# Patient Record
Sex: Female | Born: 1945 | Race: Black or African American | Hispanic: No | Marital: Married | Smoking: Never smoker
Health system: Southern US, Community
[De-identification: ages and names within clinical notes are randomized; demographics above are authoritative.]

## PROBLEM LIST (undated history)

## (undated) DIAGNOSIS — J40 Bronchitis, not specified as acute or chronic: Secondary | ICD-10-CM

## (undated) DIAGNOSIS — I10 Essential (primary) hypertension: Secondary | ICD-10-CM

## (undated) DIAGNOSIS — Z853 Personal history of malignant neoplasm of breast: Secondary | ICD-10-CM

## (undated) DIAGNOSIS — E785 Hyperlipidemia, unspecified: Secondary | ICD-10-CM

## (undated) HISTORY — DX: Essential (primary) hypertension: I10

## (undated) HISTORY — DX: Personal history of malignant neoplasm of breast: Z85.3

## (undated) HISTORY — PX: TONSILLECTOMY: SUR1361

## (undated) HISTORY — DX: Hyperlipidemia, unspecified: E78.5

---

## 1992-04-25 HISTORY — PX: OTHER SURGICAL HISTORY: SHX169

## 1992-04-25 HISTORY — PX: BREAST LUMPECTOMY: SHX2

## 1997-10-15 ENCOUNTER — Other Ambulatory Visit: Admission: RE | Admit: 1997-10-15 | Discharge: 1997-10-15 | Payer: Self-pay | Admitting: Surgery

## 2000-08-31 ENCOUNTER — Encounter: Admission: RE | Admit: 2000-08-31 | Discharge: 2000-08-31 | Payer: Self-pay | Admitting: Surgery

## 2000-08-31 ENCOUNTER — Encounter: Payer: Self-pay | Admitting: Surgery

## 2002-08-31 ENCOUNTER — Emergency Department (HOSPITAL_COMMUNITY): Admission: EM | Admit: 2002-08-31 | Discharge: 2002-08-31 | Payer: Self-pay | Admitting: Emergency Medicine

## 2003-09-26 ENCOUNTER — Inpatient Hospital Stay (HOSPITAL_COMMUNITY): Admission: EM | Admit: 2003-09-26 | Discharge: 2003-09-30 | Payer: Self-pay | Admitting: Family Medicine

## 2008-08-14 ENCOUNTER — Emergency Department (HOSPITAL_COMMUNITY): Admission: EM | Admit: 2008-08-14 | Discharge: 2008-08-14 | Payer: Self-pay | Admitting: Emergency Medicine

## 2009-05-19 ENCOUNTER — Emergency Department (HOSPITAL_COMMUNITY): Admission: EM | Admit: 2009-05-19 | Discharge: 2009-05-19 | Payer: Self-pay | Admitting: Family Medicine

## 2009-10-13 ENCOUNTER — Emergency Department (HOSPITAL_COMMUNITY): Admission: EM | Admit: 2009-10-13 | Discharge: 2009-10-13 | Payer: Self-pay | Admitting: Emergency Medicine

## 2009-10-22 ENCOUNTER — Emergency Department (HOSPITAL_COMMUNITY): Admission: EM | Admit: 2009-10-22 | Discharge: 2009-10-22 | Payer: Self-pay | Admitting: Emergency Medicine

## 2010-03-12 ENCOUNTER — Other Ambulatory Visit: Admission: RE | Admit: 2010-03-12 | Discharge: 2010-03-12 | Payer: Self-pay | Admitting: Internal Medicine

## 2010-03-12 ENCOUNTER — Ambulatory Visit: Payer: Self-pay | Admitting: Internal Medicine

## 2010-03-12 DIAGNOSIS — I10 Essential (primary) hypertension: Secondary | ICD-10-CM

## 2010-03-12 DIAGNOSIS — J45909 Unspecified asthma, uncomplicated: Secondary | ICD-10-CM

## 2010-03-12 DIAGNOSIS — Z853 Personal history of malignant neoplasm of breast: Secondary | ICD-10-CM

## 2010-03-12 DIAGNOSIS — E7801 Familial hypercholesterolemia: Secondary | ICD-10-CM

## 2010-03-12 DIAGNOSIS — E119 Type 2 diabetes mellitus without complications: Secondary | ICD-10-CM

## 2010-03-12 LAB — CONVERTED CEMR LAB
Alkaline Phosphatase: 77 units/L (ref 39–117)
BUN: 16 mg/dL (ref 6–23)
Basophils Relative: 1 % (ref 0.0–3.0)
Bilirubin, Direct: 0.1 mg/dL (ref 0.0–0.3)
Calcium: 9.4 mg/dL (ref 8.4–10.5)
Cholesterol: 214 mg/dL — ABNORMAL HIGH (ref 0–200)
Creatinine, Ser: 0.7 mg/dL (ref 0.4–1.2)
Direct LDL: 148.9 mg/dL
Eosinophils Absolute: 1.6 10*3/uL — ABNORMAL HIGH (ref 0.0–0.7)
Eosinophils Relative: 17.1 % — ABNORMAL HIGH (ref 0.0–5.0)
Hgb A1c MFr Bld: 6.8 % — ABNORMAL HIGH (ref 4.6–6.5)
Ketones, ur: NEGATIVE mg/dL
Leukocytes, UA: NEGATIVE
Lymphocytes Relative: 30.4 % (ref 12.0–46.0)
MCHC: 33.3 g/dL (ref 30.0–36.0)
Neutrophils Relative %: 44.3 % (ref 43.0–77.0)
Nitrite: NEGATIVE
Platelets: 254 10*3/uL (ref 150.0–400.0)
RBC: 4.51 M/uL (ref 3.87–5.11)
Specific Gravity, Urine: 1.025 (ref 1.000–1.030)
Total Bilirubin: 0.4 mg/dL (ref 0.3–1.2)
Total CHOL/HDL Ratio: 7
Total CK: 79 units/L (ref 7–177)
Total Protein: 7.4 g/dL (ref 6.0–8.3)
Triglycerides: 102 mg/dL (ref 0.0–149.0)
Urobilinogen, UA: 0.2 (ref 0.0–1.0)
VLDL: 20.4 mg/dL (ref 0.0–40.0)
WBC: 9.4 10*3/uL (ref 4.5–10.5)
pH: 5 (ref 5.0–8.0)

## 2010-03-14 ENCOUNTER — Encounter: Payer: Self-pay | Admitting: Internal Medicine

## 2010-03-17 ENCOUNTER — Encounter: Payer: Self-pay | Admitting: Internal Medicine

## 2010-03-22 ENCOUNTER — Ambulatory Visit: Payer: Self-pay | Admitting: Oncology

## 2010-04-23 ENCOUNTER — Ambulatory Visit: Payer: Self-pay | Admitting: Oncology

## 2010-04-27 ENCOUNTER — Encounter: Payer: Self-pay | Admitting: Internal Medicine

## 2010-05-17 ENCOUNTER — Encounter: Payer: Self-pay | Admitting: Internal Medicine

## 2010-05-17 ENCOUNTER — Other Ambulatory Visit (HOSPITAL_COMMUNITY): Payer: Self-pay | Admitting: Oncology

## 2010-05-17 DIAGNOSIS — Z1239 Encounter for other screening for malignant neoplasm of breast: Secondary | ICD-10-CM

## 2010-05-17 LAB — CBC WITH DIFFERENTIAL/PLATELET
Basophils Absolute: 0.2 10*3/uL — ABNORMAL HIGH (ref 0.0–0.1)
Eosinophils Absolute: 1.4 10*3/uL — ABNORMAL HIGH (ref 0.0–0.5)
HCT: 40 % (ref 34.8–46.6)
LYMPH%: 28.7 % (ref 14.0–49.7)
MCV: 89.9 fL (ref 79.5–101.0)
MONO%: 7.1 % (ref 0.0–14.0)
NEUT#: 5.1 10*3/uL (ref 1.5–6.5)
NEUT%: 49.3 % (ref 38.4–76.8)
Platelets: 261 10*3/uL (ref 145–400)
RBC: 4.44 10*6/uL (ref 3.70–5.45)

## 2010-05-17 LAB — COMPREHENSIVE METABOLIC PANEL
Alkaline Phosphatase: 79 U/L (ref 39–117)
BUN: 21 mg/dL (ref 6–23)
Creatinine, Ser: 0.67 mg/dL (ref 0.40–1.20)
Glucose, Bld: 83 mg/dL (ref 70–99)
Sodium: 143 mEq/L (ref 135–145)
Total Bilirubin: 0.3 mg/dL (ref 0.3–1.2)

## 2010-05-17 LAB — LACTATE DEHYDROGENASE: LDH: 140 U/L (ref 94–250)

## 2010-05-25 NOTE — Letter (Signed)
Summary: Lipid Letter  [facility]  [address] [place], [place] [address]   Phone: [phone]  Fax: [phone]    03/14/2010  Rital Cavey [address] [place], [place]  [address]  Dear Ms. Loomer:  We have carefully reviewed your last lipid profile from  and the results are noted below with a summary of recommendations for lipid management.    Cholesterol:       214     Goal: <200   HDL "good" Cholesterol:   36.64     Goal: >50   LDL "bad" Cholesterol:   149     Goal: <100   Triglycerides:       102.0     Goal: <150    other labs look good    TLC Diet (Therapeutic Lifestyle Change): Saturated Fats & Transfatty acids should be kept < 7% of total calories ***Reduce Saturated Fats Polyunstaurated Fat can be up to 10% of total calories Monounsaturated Fat Fat can be up to 20% of total calories Total Fat should be no greater than 25-35% of total calories Carbohydrates should be 50-60% of total calories Protein should be approximately 15% of total calories Fiber should be at least 20-30 grams a day ***Increased fiber may help lower LDL Total Cholesterol should be < 200mg /day Consider adding plant stanol/sterols to diet (example: Benacol spread) ***A higher intake of unsaturated fat may reduce Triglycerides and Increase HDL    Adjunctive Measures (may lower LIPIDS and reduce risk of Heart Attack) include: Aerobic Exercise (20-30 minutes 3-4 times a week) Limit Alcohol Consumption Weight Reduction Aspirin 75-81 mg a day by mouth (if not allergic or contraindicated) Dietary Fiber 20-30 grams a day by mouth     Current Medications: 1)    Azithromycin 250 Mg Tabs (Azithromycin) .... Take as directed 2)    Allegra Allergy 180 Mg Tabs (Fexofenadine hcl) 3)    Norvasc 5 Mg Tabs (Amlodipine besylate) 4)    Asa 81mg   5)    Coricidin Hbp Cough/cold 4-30 Mg Tabs (Chlorpheniramine-dm) 6)    Livalo 2 Mg Tabs (Pitavastatin calcium) .... One by mouth once  daily for cholesterol  If you have any questions, please call. We appreciate being able to work with you.   Sincerely,     [facility] Etta Grandchild MD

## 2010-05-25 NOTE — Assessment & Plan Note (Signed)
Summary: new pt/self pay/#/lb   Vital Signs:  Patient profile:   65 year old female Menstrual status:  postmenopausal Height:      70 inches Weight:      124 pounds BMI:     17.86 O2 Sat:      97 % on Room air Temp:     97.9 degrees F oral Pulse rate:   75 / minute Pulse rhythm:   regular Resp:     16 per minute BP sitting:   140 / 84  (left arm) Cuff size:   large  Vitals Entered By: Rock Nephew CMA (March 12, 2010 1:22 PM)  O2 Flow:  Room air CC: New to establish// recently seen at [facility] for elevated glucose, Preventive Care Is Patient Diabetic? Yes Did you bring your meter with you today? No Pain Assessment Patient in pain? no       Does patient need assistance? Functional Status Self care Ambulation Normal     Menstrual Status postmenopausal   Primary Care Provider:  Etta Grandchild MD  CC:  New to establish// recently seen at [facility] for elevated glucose and Preventive Care.  History of Present Illness: New to me she needs a complete physical. She has had some recent unexplained weight loss. She is taking a zpak for a recent URI and says that she is doing much better.  Preventive Screening-Counseling & Management  Alcohol-Tobacco     Alcohol drinks/day: 0     Alcohol Counseling: not indicated; patient does not drink     Smoking Status: never     Tobacco Counseling: not indicated; no tobacco use  Caffeine-Diet-Exercise     Does Patient Exercise: no  Hep-HIV-STD-Contraception     Hepatitis Risk: no risk noted     HIV Risk: no risk noted     STD Risk: no risk noted     Dental Visit-last 6 months no     Dental Care Counseling: to seek dental care; no dental care within six months     SBE monthly: yes     SBE Education/Counseling: to perform regular SBE      Sexual History:  currently monogamous.        Drug Use:  no.        Blood Transfusions:  no.    Medications Prior to Update: 1)  None  Current Medications (verified): 1)  Azithromycin 250  Mg Tabs (Azithromycin) .... Take As Directed 2)  Allegra Allergy 180 Mg Tabs (Fexofenadine Hcl) 3)  Norvasc 5 Mg Tabs (Amlodipine Besylate) 4)  Asa 81mg  5)  Coricidin Hbp Cough/cold 4-30 Mg Tabs (Chlorpheniramine-Dm) 6)  Livalo 2 Mg Tabs (Pitavastatin Calcium) .... One By Mouth Once Daily For Cholesterol  Allergies (verified): 1)  ! Lipitor  Past History:  Past Medical History: Breast cancer, hx of Diabetes mellitus, type II Hyperlipidemia Hypertension Asthma  Past Surgical History: Lumpectomy- left in 1994 Tonsillectomy right port-a-cath - in 1994  Family History: Family History Breast cancer 1st degree relative <50 Family History Diabetes 1st degree relative Family History Hypertension  Social History: Occupation: Chief Strategy Officer Married Never Smoked Alcohol use-no Drug use-no Regular exercise-no Smoking Status:  never Hepatitis Risk:  no risk noted HIV Risk:  no risk noted STD Risk:  no risk noted Dental Care w/in 6 mos.:  no Sexual History:  currently monogamous Blood Transfusions:  no Drug Use:  no Does Patient Exercise:  no  Review of Systems       The patient complains  of weight loss.  The patient denies anorexia, fever, weight gain, hoarseness, chest pain, syncope, dyspnea on exertion, peripheral edema, prolonged cough, headaches, hemoptysis, abdominal pain, melena, hematochezia, severe indigestion/heartburn, hematuria, muscle weakness, suspicious skin lesions, transient blindness, depression, abnormal bleeding, enlarged lymph nodes, angioedema, and breast masses.   General:  Complains of weight loss; denies chills, fatigue, fever, loss of appetite, malaise, sweats, and weakness. GU:  Denies abnormal vaginal bleeding, discharge, hematuria, urinary frequency, and urinary hesitancy. Endo:  Denies cold intolerance, excessive hunger, excessive thirst, excessive urination, heat intolerance, and polyuria.  Physical Exam  General:  alert, well-developed,  well-nourished, well-hydrated, appropriate dress, normal appearance, healthy-appearing, cooperative to examination, and good hygiene.   Head:  normocephalic, atraumatic, no abnormalities observed, no abnormalities palpated, and no alopecia.   Eyes:  vision grossly intact, pupils equal, pupils round, and pupils reactive to light.   Ears:  R ear normal and L ear normal.   Nose:  External nasal examination shows no deformity or inflammation. Nasal mucosa are pink and moist without lesions or exudates. Mouth:  Oral mucosa and oropharynx without lesions or exudates.  Teeth in good repair. Neck:  supple, full ROM, no masses, no thyromegaly, no thyroid nodules or tenderness, no JVD, normal carotid upstroke, no carotid bruits, no cervical lymphadenopathy, and no neck tenderness.   Chest Wall:  no deformities, no tenderness, no mass, and surgical scar.   Breasts:  No mass, nodules, thickening, tenderness, bulging, retraction, inflamation, nipple discharge or skin changes noted.   Lungs:  normal respiratory effort, no intercostal retractions, no accessory muscle use, normal breath sounds, no dullness, no fremitus, no crackles, and no wheezes.   Heart:  normal rate, regular rhythm, no murmur, no gallop, no rub, and no JVD.   Abdomen:  soft, non-tender, normal bowel sounds, no distention, no masses, no guarding, no rigidity, no rebound tenderness, no abdominal hernia, no inguinal hernia, no hepatomegaly, and no splenomegaly.   Rectal:  no external abnormalities, normal sphincter tone, no masses, no tenderness, no fissures, no fistulae, no perianal rash, and external hemorrhoid(s).   Genitalia:  normal introitus, no external lesions, no vaginal discharge, mucosa pink and moist, no vaginal or cervical lesions, no friaility or hemorrhage, normal uterus size and position, no adnexal masses or tenderness, and vaginal atrophy.    Diabetes Management Exam:    Foot Exam (with socks and/or shoes not present):        Sensory-Pinprick/Light touch:          Left medial foot (L-4): normal          Left dorsal foot (L-5): normal          Left lateral foot (S-1): normal          Right medial foot (L-4): normal          Right dorsal foot (L-5): normal          Right lateral foot (S-1): normal       Sensory-Monofilament:          Left foot: normal          Right foot: normal       Inspection:          Left foot: normal          Right foot: normal       Nails:          Left foot: normal          Right foot: normal   Impression &  Recommendations:  Problem # 1:  HYPERTENSION (ICD-401.9) Assessment Unchanged will check renal function and consider another BP med if needed Her updated medication list for this problem includes:    Norvasc 5 Mg Tabs (Amlodipine besylate)  Orders: Fingerstick ([address]) TLB-Lipid Panel (80061-LIPID) TLB-BMP (Basic Metabolic Panel-BMET) (80048-METABOL) TLB-CBC Platelet - w/Differential (85025-CBCD) TLB-Hepatic/Liver Function Pnl (80076-HEPATIC) TLB-TSH (Thyroid Stimulating Hormone) (84443-TSH) TLB-CK Total Only(Creatine Kinase/CPK) (82550-CK) TLB-A1C / Hgb A1C (Glycohemoglobin) (83036-A1C) TLB-Udip w/ Micro (81001-URINE)  BP today: 140/84  Problem # 2:  HYPERLIPIDEMIA (ICD-272.4) Assessment: Unchanged  The following medications were removed from the medication list:    Lipitor 20 Mg Tabs (Atorvastatin calcium) Her updated medication list for this problem includes:    Livalo 2 Mg Tabs (Pitavastatin calcium) ..... One by mouth once daily for cholesterol  Orders: Fingerstick (36416) TLB-Lipid Panel (80061-LIPID) TLB-BMP (Basic Metabolic Panel-BMET) (80048-METABOL) TLB-CBC Platelet - w/Differential (85025-CBCD) TLB-Hepatic/Liver Function Pnl (80076-HEPATIC) TLB-TSH (Thyroid Stimulating Hormone) (84443-TSH) TLB-CK Total Only(Creatine Kinase/CPK) (82550-CK) TLB-A1C / Hgb A1C (Glycohemoglobin) (83036-A1C) TLB-Udip w/ Micro (81001-URINE)  Problem # 3:  DIABETES  MELLITUS, TYPE II (ICD-250.00) Assessment: New  Orders: Fingerstick ([address]) TLB-Lipid Panel (80061-LIPID) TLB-BMP (Basic Metabolic Panel-BMET) (80048-METABOL) TLB-CBC Platelet - w/Differential (85025-CBCD) TLB-Hepatic/Liver Function Pnl (80076-HEPATIC) TLB-TSH (Thyroid Stimulating Hormone) (84443-TSH) TLB-CK Total Only(Creatine Kinase/CPK) (82550-CK) TLB-A1C / Hgb A1C (Glycohemoglobin) (83036-A1C) TLB-Udip w/ Micro (81001-URINE) Ophthalmology Referral (Ophthalmology)  Problem # 4:  ROUTINE GENERAL MEDICAL EXAM@HEALTH  CARE FACL (ICD-V70.0) Assessment: New  Orders: Radiology Referral (Radiology) Gastroenterology Referral (GI)  Discussed using sunscreen, use of alcohol, drug use, self breast exam, routine dental care, routine eye care, schedule for GYN exam, routine physical exam, seat belts, multiple vitamins, osteoporosis prevention, adequate calcium intake in diet, recommendations for immunizations, mammograms and Pap smears.  Discussed exercise and checking cholesterol.   Complete Medication List: 1)  Azithromycin 250 Mg Tabs (Azithromycin) .... Take as directed 2)  Allegra Allergy 180 Mg Tabs (Fexofenadine hcl) 3)  Norvasc 5 Mg Tabs (Amlodipine besylate) 4)  Asa 81mg   5)  Coricidin Hbp Cough/cold 4-30 Mg Tabs (Chlorpheniramine-dm) 6)  Livalo 2 Mg Tabs (Pitavastatin calcium) .... One by mouth once daily for cholesterol  Other Orders: Oncology Referral (Oncology) Specimen Handling (09811)  Colorectal Screening:  Current Recommendations:    Hemoccult: NEG X 1 today    Colonoscopy recommended: scheduled with G.I.  PAP Screening:    Hx Cervical Dysplasia in last 5 yrs? No    3 normal PAP smears in last 5 yrs? No    Reviewed PAP smear recommendations:  PAP smear done  Mammogram Screening:    Reviewed Mammogram recommendations:  mammogram ordered  Osteoporosis Risk Assessment:  Risk Factors for Fracture or Low Bone Density:   Smoking status:       never  Patient  Instructions: 1)  Please schedule a follow-up appointment in 1 month. 2)  Schedule your mammogram. 3)  Schedule a colonoscopy/sigmoidoscopy to help detect colon cancer. 4)  You need to have a Pap Smear to prevent cervical cancer. 5)  Check your blood sugars regularly. If your readings are usually above 200 or below 70 you should contact our office. 6)  It is important that your Diabetic A1c level is checked every 3 months. 7)  See your eye doctor yearly to check for diabetic eye damage. 8)  Check your feet each night for sore areas, calluses or signs of infection. 9)  Check your Blood Pressure regularly. If it is above 130/80: you should make an appointment. Prescriptions: LIVALO 2 MG  TABS (PITAVASTATIN CALCIUM) One by mouth once daily for cholesterol  #70 x 0   Entered and Authorized by:   Etta Grandchild MD   Signed by:   Etta Grandchild MD on 03/12/2010   Method used:   Samples Given   RxID:   [id number]    Orders Added: 1)  Oncology Referral [Oncology] 2)  Fingerstick [36416] 3)  TLB-Lipid Panel [80061-LIPID] 4)  TLB-BMP (Basic Metabolic Panel-BMET) [80048-METABOL] 5)  TLB-CBC Platelet - w/Differential [85025-CBCD] 6)  TLB-Hepatic/Liver Function Pnl [80076-HEPATIC] 7)  TLB-TSH (Thyroid Stimulating Hormone) [84443-TSH] 8)  TLB-CK Total Only(Creatine Kinase/CPK) [82550-CK] 9)  TLB-A1C / Hgb A1C (Glycohemoglobin) [83036-A1C] 10)  TLB-Udip w/ Micro [81001-URINE] 11)  Ophthalmology Referral [Ophthalmology] 6)  Radiology Referral [Radiology] 13)  Gastroenterology Referral [GI] 14)  Specimen Handling [99000] 15)  New Patient Level V [99205]    Prevention & Chronic Care Immunizations   Influenza vaccine: Not documented   Influenza vaccine deferral: Refused  (03/12/2010)    Tetanus booster: Not documented   Td booster deferral: Refused  (03/12/2010)    Pneumococcal vaccine: Not documented    H. zoster vaccine: Not documented   H. zoster vaccine deferral: Refused   (03/12/2010)  Colorectal Screening   Hemoccult: Not documented   Hemoccult action/deferral: NEG X 1 today  (03/12/2010)    Colonoscopy: Not documented   Colonoscopy action/deferral: scheduled with G.I.  (03/12/2010)  Other Screening   Pap smear: Not documented   Pap smear action/deferral: PAP smear done  (03/12/2010)    Mammogram: Not documented   Mammogram action/deferral: mammogram ordered  (03/12/2010)    DXA bone density scan: Not documented   Smoking status: never  (03/12/2010)  Diabetes Mellitus   HgbA1C: Not documented    Eye exam: Not documented    Foot exam: yes  (03/12/2010)   High risk foot: Not documented   Foot care education: Not documented    Urine microalbumin/creatinine ratio: Not documented  Lipids   Total Cholesterol: Not documented   LDL: Not documented   LDL Direct: Not documented   HDL: Not documented   Triglycerides: Not documented    SGOT (AST): Not documented   SGPT (ALT): Not documented   Alkaline phosphatase: Not documented   Total bilirubin: Not documented  Hypertension   Last Blood Pressure: 140 / 84  (03/12/2010)   Serum creatinine: Not documented   Serum potassium Not documented  Self-Management Support :    Diabetes self-management support: Not documented    Hypertension self-management support: Not documented    Lipid self-management support: Not documented

## 2010-05-25 NOTE — Letter (Signed)
Summary: Results Follow-up Letter  [facility]  [address] [place], [place] [address]   Phone: [phone]  Fax: [phone]    03/17/2010  [address] [place], [place]  [address]  Dear Ms. Marion,   The following are the results of your recent test(s):  Test     Result     Pap Smear    Normal___XXX____  Not Normal_____       Comments:   _________________________________________________________  Please call for an appointment as directed _________________________________________________________ _________________________________________________________ _________________________________________________________  Sincerely,  Sanda Linger MD  [facility]

## 2010-05-27 NOTE — Letter (Signed)
Summary: Referral - not able to see patient  [facility]  [address] [place], [place] [address]   Phone: [phone]  Fax: [phone]    April 27, 2010   Etta Grandchild, M.D. [address] [place], [place] [address]   Diane Richmond DOB:  Dec 20, 1945 MRN:   [id number]    Dear Dr. Yetta Barre:  Thank you for your kind referral of the above patient.  We have attempted to schedule the recommended procedure Screening Colonoscopy but have not been able to schedule because:   X  The patient was not available by phone and/or has not returned our calls.  ___ The patient declined to schedule the procedure at this time.  We appreciate the referral and hope that we will have the opportunity to treat this patient in the future.    Sincerely,    [facility] [phone]

## 2010-06-01 ENCOUNTER — Ambulatory Visit
Admission: RE | Admit: 2010-06-01 | Discharge: 2010-06-01 | Disposition: A | Discharge status: Home / Self Care | Payer: Self-pay | Source: Ambulatory Visit | Attending: Oncology | Admitting: Oncology

## 2010-06-01 DIAGNOSIS — Z1239 Encounter for other screening for malignant neoplasm of breast: Secondary | ICD-10-CM

## 2010-06-07 ENCOUNTER — Telehealth: Payer: Self-pay | Admitting: Internal Medicine

## 2010-06-07 ENCOUNTER — Other Ambulatory Visit (HOSPITAL_COMMUNITY): Payer: Self-pay | Admitting: Oncology

## 2010-06-07 DIAGNOSIS — R921 Mammographic calcification found on diagnostic imaging of breast: Secondary | ICD-10-CM

## 2010-06-07 DIAGNOSIS — R928 Other abnormal and inconclusive findings on diagnostic imaging of breast: Secondary | ICD-10-CM

## 2010-06-10 NOTE — Letter (Signed)
Summary: [facility]  [facility]   Imported By: Sherian Rein 06/03/2010 08:54:06  _____________________________________________________________________  External Attachment:    Type:   Image     Comment:   External Document

## 2010-06-16 NOTE — Progress Notes (Signed)
Summary: Cholesterol med  Phone Note Call from Patient   Caller: Marisue Ivan - [phone] Summary of Call: Patient is requesting rx for generic med for cholesterol. Initial call taken by: Lamar Sprinkles, CMA,  June 07, 2010 2:22 PM  Follow-up for Phone Call        Pt informed  Follow-up by: Lamar Sprinkles, CMA,  June 07, 2010 4:59 PM    New/Updated Medications: PRAVACHOL 40 MG TABS (PRAVASTATIN SODIUM) one by mouth once daily Prescriptions: PRAVACHOL 40 MG TABS (PRAVASTATIN SODIUM) one by mouth once daily  #90 x 3   Entered by:   Lamar Sprinkles, CMA   Authorized by:   Etta Grandchild MD   Signed by:   Lamar Sprinkles, CMA on 06/07/2010   Method used:   Electronically to        [facility]  [address] [phone]* (retail)       [address]       [place], [place]  [id number]       Ph: [phone] or [phone]       Fax: [phone]   RxID:   [id number] PRAVACHOL 40 MG TABS (PRAVASTATIN SODIUM) one by mouth once daily  #30 x 11   Entered and Authorized by:   Etta Grandchild MD   Signed by:   Etta Grandchild MD on 06/07/2010   Method used:   Historical   RxID:   [id number]

## 2010-07-28 ENCOUNTER — Ambulatory Visit
Admission: RE | Admit: 2010-07-28 | Discharge: 2010-07-28 | Disposition: A | Discharge status: Home / Self Care | Payer: Self-pay | Source: Ambulatory Visit | Attending: Oncology | Admitting: Oncology

## 2010-07-28 DIAGNOSIS — R928 Other abnormal and inconclusive findings on diagnostic imaging of breast: Secondary | ICD-10-CM

## 2010-08-31 ENCOUNTER — Telehealth: Payer: Self-pay | Admitting: *Deleted

## 2010-08-31 NOTE — Telephone Encounter (Signed)
Spoke w/pt, she is on payment plan and working on getting medicaid or other assistance and can not afford OV. Would MD be willing to do 1 mth RF while this issue gets settled.

## 2010-08-31 NOTE — Telephone Encounter (Signed)
Update - pt takes 2 once daily, pharm is [facility] [address], pt would like a call when complete.

## 2010-08-31 NOTE — Telephone Encounter (Signed)
Patient requesting RF of Metformin 500 mg (unsure freq). At last ov pt did not have med with her. Is this ok to fill?

## 2010-08-31 NOTE — Telephone Encounter (Signed)
okay

## 2010-08-31 NOTE — Telephone Encounter (Signed)
She is overdue for follow up

## 2010-09-01 ENCOUNTER — Telehealth: Payer: Self-pay

## 2010-09-01 MED ORDER — METFORMIN HCL 500 MG PO TABS: 500.0000 mg | ORAL_TABLET | Freq: Two times a day (BID) | ORAL | Status: DC

## 2010-09-01 NOTE — Telephone Encounter (Signed)
Patient notified

## 2010-09-10 NOTE — Discharge Summary (Signed)
Diane Richmond, Diane Richmond NO.:  [id number]   MEDICAL RECORD NO.:  [id number]                   PATIENT TYPE:  INP   LOCATION:  4743                                 FACILITY:  [facility]   PHYSICIAN:  Sherin Quarry, MD                   DATE OF BIRTH:  1945/05/03   DATE OF ADMISSION:  09/26/2003  DATE OF DISCHARGE:  09/30/2003                           DISCHARGE SUMMARY - REFERRING   HISTORY OF PRESENT ILLNESS:  The patient is a 65 year old lady who initially  presented on September 26, 2003, with onset the previous day of numbness in the  arm, described as intermittent.  The patient had numbness in her face on  September 26, 2003.  She reported that she had never had a spell like that  previously.  She had no previous history of diabetes or hypertension.   ADMISSION MEDICATIONS:  1. Clarinex.  2. Compazine p.r.n.   FAMILY HISTORY:  Remarkable for a stroke in the patient's mother and her  father.   PHYSICAL EXAMINATION:  VITAL SIGNS:  As described by Dr. Efraim Kaufmann L. Taylor,  blood pressure 154/105, pulse 70, respirations 18.  HEENT:  Within normal limits.  CHEST:  Clear to auscultation and percussion.  CARDIOVASCULAR:  Normal S1 and S2, without rubs, murmurs, or gallops.  ABDOMEN:  Benign.  Normal bowel sounds.  There are no masses or tenderness,  no guarding or rebound.  NEUROLOGIC:  The patient's strength was described as 5/5.  Deep tendon  reflexes were symmetrical.   LABORATORY DATA:  CBC which revealed a white count of 9100, hemoglobin 14.6.  Prothrombin time was within normal limits.  Sodium 142, potassium 3.7,  creatinine 0.8, BUN 7.  A1c hemoglobin was 5.7.  Homocysteine level was  11.16.  Lipid profile was notable for a total cholesterol of 276, LDL  cholesterol of 212, HDL cholesterol 30.  TSH 1.84.  RPR was nonreactive.  An MRI scan of the brain showed no acute infarction, with chronic small  vessel disease noted.  A curious finding on the MRI scan was  that the  radiologist described an abnormal process involving the marrow space of the  skull.  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  No evidence of metastatic  disease was seen in either the brain or the lepto-meninges.  The patient  does have a remote history of breast cancer.  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  No  other abnormalities were detected.  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  Carotid studies showed no significant ICA stenosis.   HOSPITAL COURSE:  The patient was seen in consultation by Dr. Sunny Schlein.  Sethi and Dr. Gustavus Messing. [facility] of the neurology service.  They carefully  reviewed the  patient's workup, and ultimately recommended that she be placed  on Aggrenox one tab b.i.d.  A 2-D echocardiogram was pending at the time of discharge.   DISPOSITION:  On September 30, 2003, the patient was discharged.   DISCHARGE DIAGNOSES:  1. Possible small vessel transient ischemic attacks.  2. Hypertension.  3. Hyperlipidemia.  4. Remote history of breast cancer.   DISCHARGE MEDICATIONS:  1. Aggrenox one tab b.i.d.  2. Lipitor 10 mg daily.  3. Norvasc 5 mg daily.   FOLLOWUP:  1. An appointment will be made with Dr. Meredith Staggers in two to three     weeks.  2. An appointment will be made with Dr. Sunny Schlein. Sethi in two to three     months.  I did not make an appointment for an oncology followup in regard to  abnormalities seen on the MRI scan.  This was because the patient was quite  unclear about what oncologist had been seeing her in the past.  She could  not describe where the doctor's office was located.  For this reason, I  thought it would be reasonable for Dr. Dayton Scrape to review the records, and to  arrange for this appointment, if he feels it is a reasonable idea.   CONDITION ON DISCHARGE:  Good.                                                 Sherin Quarry, MD    SY/MEDQ  D:  09/30/2003  T:  09/30/2003  Job:  [id number]   cc:   Meredith Staggers, M.D.  [address]  [place]  [place] [address]  Fax: [phone]

## 2010-09-10 NOTE — Consult Note (Signed)
NAMEKAILANI, BRASS NO.:  [id number]   MEDICAL RECORD NO.:  [id number]                   PATIENT TYPE:  EMS   LOCATION:  URG                                  FACILITY:  [facility]   PHYSICIAN:  Gustavus Messing. Orlin Hilding, M.D.          DATE OF BIRTH:  1946-01-01   DATE OF CONSULTATION:  09/26/2003  DATE OF DISCHARGE:                                   CONSULTATION   CHIEF COMPLAINT:  Intermittent right-sided numbness.   HISTORY OF PRESENT ILLNESS:  Ms. Splinter is a 65 year old right-handed black  woman with a history of hypertension, not currently taking medications,  although she is supposed to be on Norvasc.  She [facility] with a 24-hour history of multiple brief episodes of right-sided  numbness of face, arm and one leg.  She had several more since she has been  in the emergency room lasting from 5 minutes to 30 minutes apparently.  She  denies any problems with vision change, speech or swallowing associated with  it.   REVIEW OF SYSTEMS:  Also negative for any weakness, cognitive problems,  chest pains or shortness of breath.   PAST MEDICAL HISTORY:  She told me she had hypertension in the past.  However, she is hypertensive now and is supposed to be taking Norvasc.  She  has a history of breast cancer, status post mastectomy and chemotherapy and  allergies.   MEDICATIONS:  1. Allegra.  2. Tylenol.  3. Norvasc.  4. Prednisone.  5. Hydroxyzine.   ALLERGIES:  Aspirin although the patient is equivocal about this.   SOCIAL HISTORY:  Negative for cigarettes.   FAMILY HISTORY:  Positive for stroke.   PHYSICAL EXAMINATION:  VITAL SIGNS:  Temperature 98.8, pulse 78,  respirations 20, blood pressure 164/93.  HEENT:  Head is normocephalic, atraumatic.  NECK:  Supple without bruits.  HEART:  Regular rate and rhythm without murmurs.  EXTREMITIES:  Without edema or cyanosis.  SKIN:  Warm and dry.  NEUROLOGIC:  Mental status:  She is  awake, alert, appropriate with normal  naming, repetition and comprehensive, and also has spontaneous speech.  Cranial nerves:  Pupils are equal and reactive.  Visual fields are full to  confrontation.  Extraocular movements are intact.  Facial sensation is  normal.  Her motor activity is intact.  Hearing is intact.  Palate is symmetric,  tongue is midline.  She has a good shoulder shrug.  On motor exam, she has  no drift of either upper or lower extremity.  No satelliting.  Normal rapid  fine movement.  No fasciculations, atrophy or tremor.  Reflexes are 1+ and  symmetric with downgoing toes and plantar stimulation, coordination, finger-  to-nose, heel-to-shin are normal.  Sensory exam is intact to light touch,  temperature and vibration currently.  She is not in the middle of a spell of her numbness at this time.  IMPRESSION:  Transient but recurrent right body numbness in a setting of  hypertension.  Need to rule out small vessel ischemia.  She may be on the  verge of a small-vessel stroke with fluctuating status.   RECOMMENDATION:  Would put her on IV heparin until workup is complete.  Get  an MRI scan of the brain and MRA, intracranial and extracranial, a 2-D.  She  may need Plavix if she is truly allergic to aspirin.                                               Catherine A. Orlin Hilding, M.D.    CAW/MEDQ  D:  09/26/2003  T:  09/28/2003  Job:  [id number]

## 2010-12-01 ENCOUNTER — Other Ambulatory Visit: Payer: Self-pay | Admitting: *Deleted

## 2010-12-01 ENCOUNTER — Other Ambulatory Visit: Payer: Self-pay | Admitting: Internal Medicine

## 2010-12-01 MED ORDER — METFORMIN HCL 500 MG PO TABS: 500.0000 mg | ORAL_TABLET | Freq: Two times a day (BID) | ORAL | Status: DC

## 2010-12-06 ENCOUNTER — Ambulatory Visit: Payer: Self-pay | Admitting: Internal Medicine

## 2010-12-06 ENCOUNTER — Telehealth: Payer: Self-pay

## 2010-12-06 NOTE — Telephone Encounter (Signed)
abstract

## 2010-12-17 ENCOUNTER — Ambulatory Visit (INDEPENDENT_AMBULATORY_CARE_PROVIDER_SITE_OTHER): Payer: Self-pay | Admitting: Internal Medicine

## 2010-12-17 ENCOUNTER — Other Ambulatory Visit (INDEPENDENT_AMBULATORY_CARE_PROVIDER_SITE_OTHER): Payer: Self-pay

## 2010-12-17 ENCOUNTER — Telehealth: Payer: Self-pay

## 2010-12-17 ENCOUNTER — Encounter: Payer: Self-pay | Admitting: Internal Medicine

## 2010-12-17 ENCOUNTER — Other Ambulatory Visit: Payer: Self-pay | Admitting: Internal Medicine

## 2010-12-17 DIAGNOSIS — E785 Hyperlipidemia, unspecified: Secondary | ICD-10-CM

## 2010-12-17 DIAGNOSIS — R5381 Other malaise: Secondary | ICD-10-CM

## 2010-12-17 DIAGNOSIS — I10 Essential (primary) hypertension: Secondary | ICD-10-CM

## 2010-12-17 DIAGNOSIS — J45909 Unspecified asthma, uncomplicated: Secondary | ICD-10-CM

## 2010-12-17 DIAGNOSIS — E119 Type 2 diabetes mellitus without complications: Secondary | ICD-10-CM

## 2010-12-17 DIAGNOSIS — R9431 Abnormal electrocardiogram [ECG] [EKG]: Secondary | ICD-10-CM

## 2010-12-17 DIAGNOSIS — R5383 Other fatigue: Secondary | ICD-10-CM | POA: Insufficient documentation

## 2010-12-17 DIAGNOSIS — R079 Chest pain, unspecified: Secondary | ICD-10-CM

## 2010-12-17 LAB — CBC WITH DIFFERENTIAL/PLATELET
Basophils Relative: 0.6 % (ref 0.0–3.0)
Eosinophils Relative: 44.5 % — ABNORMAL HIGH (ref 0.0–5.0)
HCT: 39.6 % (ref 36.0–46.0)
Hemoglobin: 12.8 g/dL (ref 12.0–15.0)
Lymphs Abs: 4.2 10*3/uL — ABNORMAL HIGH (ref 0.7–4.0)
MCV: 90 fl (ref 78.0–100.0)
Monocytes Absolute: 1 10*3/uL (ref 0.1–1.0)
Monocytes Relative: 4.2 % (ref 3.0–12.0)
Neutro Abs: 8 10*3/uL — ABNORMAL HIGH (ref 1.4–7.7)
Platelets: 310 10*3/uL (ref 150.0–400.0)
RBC: 4.4 Mil/uL (ref 3.87–5.11)
WBC: 24 10*3/uL (ref 4.5–10.5)

## 2010-12-17 LAB — URINALYSIS, ROUTINE W REFLEX MICROSCOPIC
Ketones, ur: NEGATIVE
Urine Glucose: NEGATIVE
Urobilinogen, UA: 0.2 (ref 0.0–1.0)

## 2010-12-17 LAB — COMPREHENSIVE METABOLIC PANEL
Albumin: 3.7 g/dL (ref 3.5–5.2)
Alkaline Phosphatase: 96 U/L (ref 39–117)
BUN: 10 mg/dL (ref 6–23)
CO2: 29 mEq/L (ref 19–32)
GFR: 119.89 mL/min (ref >60.00)
Glucose, Bld: 83 mg/dL (ref 70–99)
Sodium: 140 mEq/L (ref 135–145)
Total Bilirubin: 0.4 mg/dL (ref 0.3–1.2)
Total Protein: 9.1 g/dL — ABNORMAL HIGH (ref 6.0–8.3)

## 2010-12-17 LAB — HEMOGLOBIN A1C: Hgb A1c MFr Bld: 6 % (ref 4.6–6.5)

## 2010-12-17 LAB — LDL CHOLESTEROL, DIRECT: Direct LDL: 156.5 mg/dL

## 2010-12-17 LAB — LIPID PANEL
Total CHOL/HDL Ratio: 8
Triglycerides: 150 mg/dL — ABNORMAL HIGH (ref 0.0–149.0)

## 2010-12-17 LAB — TSH: TSH: 0.95 u[IU]/mL (ref 0.35–5.50)

## 2010-12-17 MED ORDER — METHYLPREDNISOLONE ACETATE 80 MG/ML IJ SUSP
120.0000 mg | Freq: Once | INTRAMUSCULAR | Status: AC
  Administered 2010-12-17: 120 mg via INTRAMUSCULAR

## 2010-12-17 MED ORDER — OLMESARTAN MEDOXOMIL 20 MG PO TABS: 20.0000 mg | ORAL_TABLET | Freq: Every day | ORAL | Status: DC

## 2010-12-17 MED ORDER — BUDESONIDE-FORMOTEROL FUMARATE 80-4.5 MCG/ACT IN AERO: 2.0000 | INHALATION_SPRAY | Freq: Two times a day (BID) | RESPIRATORY_TRACT | Status: DC

## 2010-12-17 NOTE — Progress Notes (Signed)
Subjective:    Patient ID: Diane Richmond, female    DOB: Oct 11, 1945, 65 y.o.   MRN: [id number]  Diabetes She presents for her follow-up diabetic visit. She has type 2 diabetes mellitus. Her disease course has been fluctuating. There are no hypoglycemic associated symptoms. Pertinent negatives for hypoglycemia include no dizziness, headaches, pallor, seizures, speech difficulty, sweats or tremors. Associated symptoms include fatigue. Pertinent negatives for diabetes include no blurred vision, no chest pain, no foot paresthesias, no foot ulcerations, no polydipsia, no polyphagia, no polyuria, no visual change, no weakness and no weight loss. Symptoms are stable. There are no diabetic complications. Current diabetic treatment includes oral agent (monotherapy). She is compliant with treatment all of the time. Her weight is stable. She is following a generally healthy diet. Meal planning includes avoidance of concentrated sweets. She never participates in exercise. There is no change in her home blood glucose trend. An ACE inhibitor/angiotensin II receptor blocker is not being taken. Eye exam is current.  Hypertension This is a chronic problem. The current episode started more than 1 year ago. The problem has been gradually worsening since onset. The problem is uncontrolled. Associated symptoms include malaise/fatigue. Pertinent negatives include no anxiety, blurred vision, chest pain, headaches, neck pain, orthopnea, palpitations, peripheral edema, PND, shortness of breath or sweats. There are no associated agents to hypertension. Past treatments include calcium channel blockers. The current treatment provides moderate improvement. Compliance problems include exercise and diet.       Review of Systems  Constitutional: Positive for malaise/fatigue and fatigue. Negative for fever, chills, weight loss, diaphoresis, activity change, appetite change and unexpected weight change.  HENT: Negative for sore throat,  facial swelling, trouble swallowing, neck pain, neck stiffness and voice change.   Eyes: Negative for blurred vision, photophobia, redness and visual disturbance.  Respiratory: Negative for apnea, cough, choking, chest tightness, shortness of breath, wheezing and stridor.   Cardiovascular: Negative for chest pain, palpitations, orthopnea, leg swelling and PND.  Gastrointestinal: Negative for nausea, vomiting, diarrhea, constipation, blood in stool and abdominal distention.  Genitourinary: Negative for dysuria, urgency, polyuria, frequency, hematuria, flank pain, decreased urine volume, enuresis, difficulty urinating, genital sores and dyspareunia.  Musculoskeletal: Negative for myalgias, back pain, joint swelling, arthralgias and gait problem.  Skin: Negative for color change, pallor, rash and wound.  Neurological: Negative for dizziness, tremors, seizures, syncope, facial asymmetry, speech difficulty, weakness, light-headedness, numbness and headaches.  Hematological: Negative for polydipsia, polyphagia and adenopathy. Does not bruise/bleed easily.  Psychiatric/Behavioral: Negative.        Objective:   Physical Exam  Vitals reviewed. Constitutional: She is oriented to person, place, and time. She appears well-developed and well-nourished. No distress.  HENT:  Head: Normocephalic and atraumatic.  Right Ear: External ear normal.  Left Ear: External ear normal.  Nose: Nose normal.  Mouth/Throat: Oropharynx is clear and moist. No oropharyngeal exudate.  Eyes: Conjunctivae are normal. Right eye exhibits no discharge. Left eye exhibits no discharge. No scleral icterus.  Neck: Normal range of motion. Neck supple. No JVD present. No tracheal deviation present. No thyromegaly present.  Cardiovascular: Normal rate, regular rhythm, normal heart sounds and intact distal pulses.  Exam reveals no gallop and no friction rub.   No murmur heard. Pulmonary/Chest: Effort normal and breath sounds normal. No  stridor. No respiratory distress. She has no wheezes. She has no rales. She exhibits no tenderness.  Abdominal: Soft. Bowel sounds are normal. She exhibits no distension and no mass. There is no tenderness. There is no  rebound and no guarding.  Musculoskeletal: Normal range of motion. She exhibits no edema and no tenderness.  Lymphadenopathy:    She has no cervical adenopathy.  Neurological: She is alert and oriented to person, place, and time. She has normal reflexes. She displays normal reflexes. No cranial nerve deficit. She exhibits normal muscle tone.  Skin: Skin is warm and dry. No rash noted. She is not diaphoretic. No erythema. No pallor.  Psychiatric: She has a normal mood and affect. Her behavior is normal. Judgment and thought content normal.      Lab Results  Component Value Date   WBC 9.4 03/12/2010   HGB 13.7 05/17/2010   HCT 40.0 05/17/2010   PLT 261 05/17/2010   CHOL 214* 03/12/2010   TRIG 102.0 03/12/2010   HDL 32.80* 03/12/2010   LDLDIRECT 148.9 03/12/2010   ALT 11 05/17/2010   AST 19 05/17/2010   NA 143 05/17/2010   K 4.0 05/17/2010   CL 102 05/17/2010   CREATININE 0.67 05/17/2010   BUN 21 05/17/2010   CO2 30 05/17/2010   TSH 0.81 03/12/2010   HGBA1C 6.8* 03/12/2010      Assessment & Plan:

## 2010-12-17 NOTE — Patient Instructions (Signed)
Diabetes, Type 2 Diabetes is a lasting (chronic) disease. In type 2 diabetes, the pancreas does not make enough insulin (a hormone), and the body does not respond normally to the insulin that is made. This type of diabetes was also previously called adult onset diabetes. About 90% of all those who have diabetes have type 2. It usually occurs after the age of 40 but can occur at any age. CAUSES Unlike type 1 diabetes, which happens because insulin is no longer being made, type 2 diabetes happens because the body is making less insulin and has trouble using the insulin properly. SYMPTOMS  Drinking more than usual.   Urinating more than usual.   Blurred vision.   Dry, itchy skin.   Frequent infection like yeast infections in women.   More tired than usual (fatigue).  TREATMENT  Healthy eating.   Exercise.   Medication, if needed.   Monitoring blood glucose (sugar).   Seeing your caregiver regularly.  HOME CARE INSTRUCTIONS  Check your blood glucose (sugar) at least once daily. More frequent monitoring may be necessary, depending on your medications and on how well your diabetes is controlled. Your caregiver will advise you.   Take your medicine as directed by your caregiver.   Do not smoke.   Make wise food choices. Ask your caregiver for information. Weight loss can improve your diabetes.   Learn about low blood glucose (hypoglycemia) and how to treat it.   Get your eyes checked regularly.   Have a yearly physical exam. Have your blood pressure checked. Get your blood and urine tested.   Wear a pendant or bracelet saying that you have diabetes.   Check your feet every night for sores. Let your caregiver know if you have sores that are not healing.  SEEK MEDICAL CARE IF:  You are having problems keeping your blood glucose at target range.   You feel you might be having problems with your medicines.   You have symptoms of an illness that is not improving after 24  hours.   You have a sore or wound that is not healing.   You notice a change in vision or a new problem with your vision.   You develop a fever of more than 100.5.  Document Released: 04/11/2005 Document Re-Released: 05/03/2009 [employer] Patient Information 2011 [employer]. 

## 2010-12-17 NOTE — Telephone Encounter (Signed)
FYI.Marland KitchenMarland KitchenMarland KitchenHope with the lab called to report a critical white count of 24.0. Results reviewed by Dr Posey Rea who advised that pt should have CBC rechecked in about a month.

## 2010-12-19 ENCOUNTER — Encounter: Payer: Self-pay | Admitting: Internal Medicine

## 2010-12-19 NOTE — Assessment & Plan Note (Signed)
Her EKD shows a LBBB, I do not have an old EKG for comparison, she has fatigue but no other symptoms, I will ask her to see cardiology

## 2010-12-19 NOTE — Assessment & Plan Note (Signed)
She does not check her BS, today I will check an A1C and will monitor her renal function

## 2010-12-19 NOTE — Assessment & Plan Note (Signed)
I will check labs to look for a cause of her fatigue

## 2010-12-19 NOTE — Assessment & Plan Note (Signed)
Continue current regimen

## 2010-12-19 NOTE — Assessment & Plan Note (Signed)
Her BP is not well controlled and she needs renal protection so I have added Benicar to the amlodipine

## 2010-12-20 ENCOUNTER — Ambulatory Visit: Payer: Self-pay | Admitting: Cardiovascular Disease

## 2010-12-22 ENCOUNTER — Ambulatory Visit: Payer: Self-pay | Admitting: Internal Medicine

## 2010-12-28 ENCOUNTER — Encounter: Payer: Self-pay | Admitting: Cardiovascular Disease

## 2010-12-31 ENCOUNTER — Other Ambulatory Visit: Payer: Self-pay | Admitting: Internal Medicine

## 2010-12-31 ENCOUNTER — Ambulatory Visit: Payer: Self-pay | Admitting: Cardiovascular Disease

## 2011-01-03 ENCOUNTER — Other Ambulatory Visit: Payer: Self-pay | Admitting: Internal Medicine

## 2011-01-28 ENCOUNTER — Ambulatory Visit: Payer: Self-pay | Admitting: Cardiovascular Disease

## 2011-02-16 ENCOUNTER — Encounter: Payer: Self-pay | Admitting: *Deleted

## 2011-03-04 ENCOUNTER — Other Ambulatory Visit: Payer: Self-pay | Admitting: Internal Medicine

## 2011-03-04 ENCOUNTER — Ambulatory Visit (INDEPENDENT_AMBULATORY_CARE_PROVIDER_SITE_OTHER): Payer: Self-pay | Admitting: Internal Medicine

## 2011-03-04 ENCOUNTER — Encounter: Payer: Self-pay | Admitting: Internal Medicine

## 2011-03-04 VITALS — BP 118/82 | HR 96 | Temp 97.7°F | Wt 115.8 lb

## 2011-03-04 DIAGNOSIS — J0100 Acute maxillary sinusitis, unspecified: Secondary | ICD-10-CM

## 2011-03-04 DIAGNOSIS — H6690 Otitis media, unspecified, unspecified ear: Secondary | ICD-10-CM

## 2011-03-04 DIAGNOSIS — K029 Dental caries, unspecified: Secondary | ICD-10-CM

## 2011-03-04 MED ORDER — HYDROCODONE-HOMATROPINE 5-1.5 MG/5ML PO SYRP: 5.0000 mL | ORAL_SOLUTION | Freq: Four times a day (QID) | ORAL | Status: AC | PRN

## 2011-03-04 MED ORDER — AMOXICILLIN-POT CLAVULANATE 875-125 MG PO TABS: 1.0000 | ORAL_TABLET | Freq: Two times a day (BID) | ORAL | Status: AC

## 2011-03-04 NOTE — Progress Notes (Signed)
  Subjective:    Patient ID: Diane Richmond, female    DOB: Aug 23, 1945, 65 y.o.   MRN: [id number]  HPI complains of cough>> productive of yellow sputum associated with overwhelming fatigue and right sided maxillary sinus pressure Mod ear pain, upper jaw pain and throat pain on same right side Known "bad tooth" on upper right side but has not yet seen a dentist No fever but +chills Onset 4 days ago Not improved with OTC cough meds  Past Medical History  Diagnosis Date  . Hypertension   . Hyperlipidemia   . Diabetes mellitus     type II  . HX: breast cancer   . Asthma     Review of Systems  Constitutional: Positive for fatigue. Negative for fever.  Respiratory: Negative for shortness of breath and wheezing.   Cardiovascular: Negative for palpitations and leg swelling.  Neurological: Negative for light-headedness and headaches.       Objective:   Physical Exam BP 118/82  Pulse 96  Temp(Src) 97.7 F (36.5 C) (Oral)  Wt 115 lb 12.8 oz (52.527 kg)  SpO2 96% Constitutional: She appears tired but well-developed and well-nourished. No distress. dtr at side HENT: Head: Normocephalic and atraumatic. Ears: L TM ok, R TM with erythema and hazy effusion; Nose: Nose normal. Tender over R maxillary region Mouth/Throat: Oropharynx is clear and moist. No oropharyngeal exudate.  poor dentition right upper molar caries Eyes: Conjunctivae and EOM are normal. Pupils are equal, round, and reactive to light. No scleral icterus.  Neck: Normal range of motion. Neck supple. No JVD present. R side cervical LAD, mild. No thyromegaly present.  Cardiovascular: Normal rate, regular rhythm and normal heart sounds.  No murmur heard. No BLE edema. Pulmonary/Chest: Effort normal and breath sounds normal. No respiratory distress. She has no wheezes.  Neurological: She is alert and oriented to person, place, and time. No cranial nerve deficit. Coordination normal.  Psychiatric: She has a normal mood and  affect. Her behavior is normal. Judgment and thought content normal.        Assessment & Plan:  Right otitis media Right maxillary sinusitis Right side dental caries  No neuro deficit Treat with Augmentin x10 days as well as Hydromet for pain and cough control Advised followup with dentist next week Call sooner if worse or unimproved

## 2011-03-04 NOTE — Patient Instructions (Signed)
It was good to see you today. Use Augmentin antibiotics twice daily for 10 days Also prescription cough medication for pain and cough symptoms Your prescription(s) have been submitted to your pharmacy. Please take as directed and contact our office if you believe you are having problem(s) with the medication(s). Call your dentist for followup next week If symptoms unimproved with the above treatment, call for reevaluation, sooner if worse

## 2011-03-10 ENCOUNTER — Telehealth: Payer: Self-pay

## 2011-03-10 MED ORDER — METFORMIN HCL 500 MG PO TABS: 500.0000 mg | ORAL_TABLET | Freq: Two times a day (BID) | ORAL | Status: DC

## 2011-03-10 NOTE — Telephone Encounter (Signed)
Received refill request from for  [facility] for metformin 500mg .

## 2011-03-14 ENCOUNTER — Encounter: Payer: Self-pay | Admitting: Cardiovascular Disease

## 2011-04-08 ENCOUNTER — Other Ambulatory Visit: Payer: Self-pay | Admitting: Internal Medicine

## 2011-04-21 ENCOUNTER — Encounter: Payer: Self-pay | Admitting: Cardiovascular Disease

## 2011-05-09 ENCOUNTER — Other Ambulatory Visit: Payer: Self-pay | Admitting: Internal Medicine

## 2011-05-25 ENCOUNTER — Encounter: Payer: Self-pay | Admitting: Cardiovascular Disease

## 2011-05-25 DIAGNOSIS — I447 Left bundle-branch block, unspecified: Secondary | ICD-10-CM | POA: Insufficient documentation

## 2011-05-26 ENCOUNTER — Ambulatory Visit: Payer: Self-pay | Admitting: Cardiovascular Disease

## 2011-06-10 ENCOUNTER — Ambulatory Visit (INDEPENDENT_AMBULATORY_CARE_PROVIDER_SITE_OTHER)
Admission: RE | Admit: 2011-06-10 | Discharge: 2011-06-10 | Disposition: A | Discharge status: Home / Self Care | Payer: Medicare Other | Source: Ambulatory Visit | Attending: Endocrinology | Admitting: Endocrinology

## 2011-06-10 ENCOUNTER — Encounter: Payer: Self-pay | Admitting: Endocrinology

## 2011-06-10 ENCOUNTER — Ambulatory Visit (INDEPENDENT_AMBULATORY_CARE_PROVIDER_SITE_OTHER): Payer: Medicare Other | Admitting: Endocrinology

## 2011-06-10 VITALS — BP 142/88 | HR 85 | Temp 98.4°F

## 2011-06-10 DIAGNOSIS — R059 Cough, unspecified: Secondary | ICD-10-CM

## 2011-06-10 DIAGNOSIS — R05 Cough: Secondary | ICD-10-CM

## 2011-06-10 MED ORDER — GLUCOSE BLOOD VI STRP: 1.0000 | ORAL_STRIP | Status: DC | PRN

## 2011-06-10 MED ORDER — METHYLPREDNISOLONE (PAK) 4 MG PO TABS: ORAL_TABLET | ORAL | Status: DC

## 2011-06-10 MED ORDER — GLUCOSE BLOOD VI STRP: 1.0000 | ORAL_STRIP | Freq: Every day | Status: DC

## 2011-06-10 MED ORDER — PROMETHAZINE-CODEINE 6.25-10 MG/5ML PO SYRP
5.0000 mL | ORAL_SOLUTION | ORAL | Status: AC | PRN
Start: 2011-06-10 — End: 2011-06-20

## 2011-06-10 MED ORDER — CEFUROXIME AXETIL 250 MG PO TABS: 250.0000 mg | ORAL_TABLET | Freq: Two times a day (BID) | ORAL | Status: AC

## 2011-06-10 NOTE — Progress Notes (Signed)
  Subjective:    Patient ID: Diane Richmond, female    DOB: July 29, 1945, 66 y.o.   MRN: [id number]  HPI Pt states 1 week of slight prod-quality cough in the chest, and assoc wheezing.  She has nasal congestion.   She has not recently checked cbg.   Past Medical History  Diagnosis Date  . Hypertension   . Hyperlipidemia   . Diabetes mellitus     type II  . HX: breast cancer   . Asthma     Past Surgical History  Procedure Date  . Breast lumpectomy 1994    left  . Tonsillectomy   . Right port-a-cath 1994    History   Social History  . Marital Status: Married    Spouse Name: N/A    Number of Children: N/A  . Years of Education: N/A   Occupational History  . nurse assistant    Social History Main Topics  . Smoking status: Never Smoker   . Smokeless tobacco: Not on file  . Alcohol Use: No  . Drug Use: No  . Sexually Active: Not Currently   Other Topics Concern  . Not on file   Social History Narrative  . No narrative on file    Current Outpatient Prescriptions on File Prior to Visit  Medication Sig Dispense Refill  . amLODipine (NORVASC) 5 MG tablet Take 5 mg by mouth daily.        Marland Kitchen aspirin 81 MG tablet Take 81 mg by mouth daily.        . Blood Glucose Monitoring Suppl (ACCU-CHEK NANO SMARTVIEW) W/DEVICE KIT by Does not apply route.        . budesonide-formoterol (SYMBICORT) 80-4.5 MCG/ACT inhaler Inhale 2 puffs into the lungs 2 (two) times daily.  4 Inhaler  0  . Fexofenadine HCl (ALLEGRA PO) Take by mouth daily.       . metFORMIN (GLUCOPHAGE) 500 MG tablet Take 1 tablet (500 mg total) by mouth 2 (two) times daily.  60 tablet  0  . olmesartan (BENICAR) 20 MG tablet Take 1 tablet (20 mg total) by mouth daily.  70 tablet  0  . Oxymetazoline HCl (AFRIN NASAL SPRAY NA) Place into the nose.        . pravastatin (PRAVACHOL) 40 MG tablet Take 40 mg by mouth daily.          Allergies  Allergen Reactions  . Atorvastatin     REACTION: legs ache    Family History    Problem Relation Age of Onset  . Cancer Neg Hx   . Heart disease Neg Hx   . Breast cancer    . Diabetes    . Hypertension      BP 142/88  Pulse 85  Temp(Src) 98.4 F (36.9 C) (Oral)  SpO2 95%    Review of Systems She has sore throat.  She has chills, but is uncertain about fever.     Objective:   Physical Exam VITAL SIGNS:  See vs page GENERAL: no distress head: no deformity eyes: no periorbital swelling, no proptosis external nose and ears are normal mouth: no lesion seen Both eac's and tm's are normal NECK: There is no palpable thyroid enlargement.  No thyroid nodule is palpable.  No palpable lymphadenopathy at the anterior neck. LUNGS:  Clear to auscultation   Cxr: nad    Assessment & Plan:  Acute bronchitis, new DM.  She is at risk for worsening with steroids

## 2011-06-10 NOTE — Patient Instructions (Addendum)
Here are 3 prescriptions: steroid "pack," antibiotic, and cough syrup. I hope you feel better soon.  If you don't feel better by next week, please call back.   check your blood sugar 1 time a day.  vary the time of day when you check, between before the 3 meals, and at bedtime.  also check if you have symptoms of your blood sugar being too high or too low.  please keep a record of the readings and bring it to your next appointment here.  please call us sooner if your blood sugar goes over 200. A chest-x-ray is requested for you today.  please call [phone] to hear your test results.  You will be prompted to enter the 9-digit "MRN" number that appears at the top left of this page, followed by #.  Then you will hear the message. (update: i left message on phone-tree:  rx as we discussed)

## 2011-06-20 ENCOUNTER — Other Ambulatory Visit: Payer: Self-pay | Admitting: Internal Medicine

## 2011-07-01 ENCOUNTER — Encounter: Payer: Self-pay | Admitting: Internal Medicine

## 2011-07-01 ENCOUNTER — Ambulatory Visit (INDEPENDENT_AMBULATORY_CARE_PROVIDER_SITE_OTHER): Payer: Medicare Other | Admitting: Internal Medicine

## 2011-07-01 VITALS — BP 150/82 | HR 93 | Temp 97.6°F

## 2011-07-01 DIAGNOSIS — J45901 Unspecified asthma with (acute) exacerbation: Secondary | ICD-10-CM

## 2011-07-01 DIAGNOSIS — E119 Type 2 diabetes mellitus without complications: Secondary | ICD-10-CM

## 2011-07-01 MED ORDER — AZITHROMYCIN 250 MG PO TABS: ORAL_TABLET | ORAL | Status: DC

## 2011-07-01 MED ORDER — METHYLPREDNISOLONE ACETATE 80 MG/ML IJ SUSP
80.0000 mg | Freq: Once | INTRAMUSCULAR | Status: AC
  Administered 2011-07-01: 80 mg via INTRAMUSCULAR

## 2011-07-01 NOTE — Progress Notes (Signed)
  Subjective:   HPI  complains of continued chest cold symptoms  Onset >3 week ago, seen here for same - improved briefly, but then resumed symptoms  Initially associated with rhinorrhea, sneezing, sore throat, mild headache and low grade fever Now cough, wheeze, shortness of breath and mild-mod chest congestion No relief with OTC meds - did not take medrol taper Denies sick contacts  Past Medical History  Diagnosis Date  . Hypertension   . Hyperlipidemia   . Diabetes mellitus     type II  . HX: breast cancer   . Asthma     Review of Systems Constitutional: No night sweats, no unexpected weight change Pulmonary: No pleurisy or hemoptysis Cardiovascular: No chest pain or palpitations     Objective:   Physical Exam BP 150/82  Pulse 93  Temp(Src) 97.6 F (36.4 C) (Oral)  SpO2 97% GEN: mildly ill appearing and audible chest congestion HENT: NCAT, no sinus tenderness bilaterally, nares with clear discharge, oropharynx mild erythema, no exudate Eyes: Vision grossly intact, no conjunctivitis Lungs: Bilateral scattered rhonchi - diffuse expiratory wheeze, no increased work of breathing at rest Cardiovascular: Regular rate and rhythm, no bilateral edema  Lab Results  Component Value Date   WBC 24.0 cH* 12/17/2010   HGB 12.8 12/17/2010   HCT 39.6 12/17/2010   PLT 310.0 12/17/2010   GLUCOSE 83 12/17/2010   CHOL 211* 12/17/2010   TRIG 150.0* 12/17/2010   HDL 27.70* 12/17/2010   LDLDIRECT 156.5 12/17/2010   ALT 10 12/17/2010   AST 20 12/17/2010   NA 140 12/17/2010   K 4.0 12/17/2010   CL 103 12/17/2010   CREATININE 0.6 12/17/2010   BUN 10 12/17/2010   CO2 29 12/17/2010   TSH 0.95 12/17/2010   HGBA1C 6.0 12/17/2010      Assessment & Plan:  Asthmatic bronchitis -  Persisting intrinsic asthma Cough, postnasal drip related to above DM2    Reviewed indication importance of asthma medications Add rescue inhaler albuterol when necessary IM Medrol today with nebulized treatment in  office> symptoms improved Empiric antibiotics prescribed due to symptom duration greater than 7 days Symptomatic care with Tylenol or Advil, hydration and rest -  salt gargle advised as needed patient advised on anticipated increase in cbgs due to steroid effect for next several days - pt will call if cbg>200

## 2011-07-01 NOTE — Patient Instructions (Addendum)
It was good to see you today. We have reviewed your usual home medications today and clarified the purpose of schedule at each Use albuterol rescue inhaler 2 puffs every 6 hours as needed for wheeze, cough or shortness of the Steroid shot and albuterol treatment nebulizer given in office today Zpak antibiotics - Your prescription(s) have been submitted to your pharmacy. Please take as directed and contact our office if you believe you are having problem(s) with the medication(s). Use the Medrol taper pack as previously prescribed by Dr. Everardo All in February Call if symptoms unimproved in 7-10 days, sooner if worse Watch your sugars and call if over 200 in next few days - steroids will increase your sugars more than usual for next few days!

## 2011-07-05 ENCOUNTER — Encounter: Payer: Self-pay | Admitting: Cardiovascular Disease

## 2011-07-09 ENCOUNTER — Other Ambulatory Visit: Payer: Self-pay | Admitting: Internal Medicine

## 2011-07-20 ENCOUNTER — Encounter: Payer: Self-pay | Admitting: Internal Medicine

## 2011-07-20 ENCOUNTER — Ambulatory Visit (INDEPENDENT_AMBULATORY_CARE_PROVIDER_SITE_OTHER): Payer: Medicare Other | Admitting: Internal Medicine

## 2011-07-20 VITALS — BP 158/84 | HR 84 | Temp 97.4°F | Resp 16 | Wt 120.0 lb

## 2011-07-20 DIAGNOSIS — E785 Hyperlipidemia, unspecified: Secondary | ICD-10-CM

## 2011-07-20 DIAGNOSIS — J45901 Unspecified asthma with (acute) exacerbation: Secondary | ICD-10-CM

## 2011-07-20 DIAGNOSIS — J209 Acute bronchitis, unspecified: Secondary | ICD-10-CM | POA: Insufficient documentation

## 2011-07-20 DIAGNOSIS — E119 Type 2 diabetes mellitus without complications: Secondary | ICD-10-CM

## 2011-07-20 DIAGNOSIS — R05 Cough: Secondary | ICD-10-CM | POA: Insufficient documentation

## 2011-07-20 DIAGNOSIS — D72829 Elevated white blood cell count, unspecified: Secondary | ICD-10-CM

## 2011-07-20 DIAGNOSIS — J45909 Unspecified asthma, uncomplicated: Secondary | ICD-10-CM

## 2011-07-20 DIAGNOSIS — I10 Essential (primary) hypertension: Secondary | ICD-10-CM

## 2011-07-20 MED ORDER — BUDESONIDE-FORMOTEROL FUMARATE 160-4.5 MCG/ACT IN AERO: 2.0000 | INHALATION_SPRAY | Freq: Two times a day (BID) | RESPIRATORY_TRACT | Status: DC

## 2011-07-20 NOTE — Patient Instructions (Signed)
Diabetes, Type 2 Diabetes is a long-lasting (chronic) disease. In type 2 diabetes, the pancreas does not make enough insulin (a hormone), and the body does not respond normally to the insulin that is made. This type of diabetes was also previously called adult-onset diabetes. It usually occurs after the age of 40, but it can occur at any age.  CAUSES  Type 2 diabetes happens because the pancreasis not making enough insulin or your body has trouble using the insulin that your pancreas does make properly. SYMPTOMS   Drinking more than usual.   Urinating more than usual.   Blurred vision.   Dry, itchy skin.   Frequent infections.   Feeling more tired than usual (fatigue).  DIAGNOSIS The diagnosis of type 2 diabetes is usually made by one of the following tests:  Fasting blood glucose test. You will not eat for at least 8 hours and then take a blood test.   Random blood glucose test. Your blood glucose (sugar) is checked at any time of the day regardless of when you ate.   Oral glucose tolerance test (OGTT). Your blood glucose is measured after you have not eaten (fasted) and then after you drink a glucose containing beverage.  TREATMENT   Healthy eating.   Exercise.   Medicine, if needed.   Monitoring blood glucose.   Seeing your caregiver regularly.  HOME CARE INSTRUCTIONS   Check your blood glucose at least once a day. More frequent monitoring may be necessary, depending on your medicines and on how well your diabetes is controlled. Your caregiver will advise you.   Take your medicine as directed by your caregiver.   Do not smoke.   Make wise food choices. Ask your caregiver for information. Weight loss can improve your diabetes.   Learn about low blood glucose (hypoglycemia) and how to treat it.   Get your eyes checked regularly.   Have a yearly physical exam. Have your blood pressure checked and your blood and urine tested.   Wear a pendant or bracelet saying  that you have diabetes.   Check your feet every night for cuts, sores, blisters, and redness. Let your caregiver know if you have any problems.  SEEK MEDICAL CARE IF:   You have problems keeping your blood glucose in target range.   You have problems with your medicines.   You have symptoms of an illness that do not improve after 24 hours.   You have a sore or wound that is not healing.   You notice a change in vision or a new problem with your vision.   You have a fever.  MAKE SURE YOU:  Understand these instructions.   Will watch your condition.   Will get help right away if you are not doing well or get worse.  Document Released: 04/11/2005 Document Revised: 03/31/2011 Document Reviewed: 09/27/2010 [employer] Patient Information 2012 [employer].Asthma, Adult Asthma is caused by narrowing of the air passages in the lungs. It may be triggered by pollen, dust, animal dander, molds, some foods, respiratory infections, exposure to smoke, exercise, emotional stress or other allergens (things that cause allergic reactions or allergies). Repeat attacks are common. HOME CARE INSTRUCTIONS   Use prescription medications as ordered by your caregiver.   Avoid pollen, dust, animal dander, molds, smoke and other things that cause attacks at home and at work.   You may have fewer attacks if you decrease dust in your home. Electrostatic air cleaners may help.   It may help   to replace your pillows or mattress with materials less likely to cause allergies.   Talk to your caregiver about an action plan for managing asthma attacks at home, including, the use of a peak flow meter which measures the severity of your asthma attack. An action plan can help minimize or stop the attack without having to seek medical care.   If you are not on a fluid restriction, drink 8 to 10 glasses of water each day.   Always have a plan prepared for seeking medical attention, including, calling your physician,  accessing local emergency care, and calling 911 (in the [place] for a severe attack.   Discuss possible exercise routines with your caregiver.   If animal dander is the cause of asthma, you may need to get rid of pets.  SEEK MEDICAL CARE IF:   You have wheezing and shortness of breath even if taking medicine to prevent attacks.   You have muscle aches, chest pain or thickening of sputum.   Your sputum changes from clear or white to yellow, green, gray, or bloody.   You have any problems that may be related to the medicine you are taking (such as a rash, itching, swelling or trouble breathing).  SEEK IMMEDIATE MEDICAL CARE IF:   Your usual medicines do not stop your wheezing or there is increased coughing and/or shortness of breath.   You have increased difficulty breathing.   You have a fever.  MAKE SURE YOU:   Understand these instructions.   Will watch your condition.   Will get help right away if you are not doing well or get worse.  Document Released: 04/11/2005 Document Revised: 03/31/2011 Document Reviewed: 11/28/2007 [employer] Patient Information 2012 [employer]. 

## 2011-07-20 NOTE — Assessment & Plan Note (Signed)
I will recheck her CBC today and will look at a SPEP also to screen for lymphoproliferative disease
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
## 2011-07-21 NOTE — Assessment & Plan Note (Signed)
She is due for an a1c today and I will check her renal function as well

## 2011-07-21 NOTE — Assessment & Plan Note (Signed)
The bacterial component has resolved, she will try OTC cough suppressants

## 2011-07-21 NOTE — Assessment & Plan Note (Signed)
Her BP is well controlled, I will check her lytes and renal function today 

## 2011-07-21 NOTE — Assessment & Plan Note (Signed)
CXR today to look for mass, lesion, pna

## 2011-07-21 NOTE — Assessment & Plan Note (Signed)
She has already received antibiotics and steroids for this condition, she has not been very compliant with her inhalers so I asked her to be more compliant and I raised the strength of the ICS in her symbicort inhaler

## 2011-07-21 NOTE — Assessment & Plan Note (Signed)
She is doing well on pravastatin but she is due for labs today

## 2011-08-12 ENCOUNTER — Other Ambulatory Visit: Payer: Self-pay | Admitting: Internal Medicine

## 2011-09-01 ENCOUNTER — Encounter: Payer: Self-pay | Admitting: Internal Medicine

## 2011-09-01 ENCOUNTER — Ambulatory Visit (INDEPENDENT_AMBULATORY_CARE_PROVIDER_SITE_OTHER)
Admission: RE | Admit: 2011-09-01 | Discharge: 2011-09-01 | Disposition: A | Discharge status: Home / Self Care | Payer: Medicare Other | Source: Ambulatory Visit | Attending: Internal Medicine | Admitting: Internal Medicine

## 2011-09-01 ENCOUNTER — Ambulatory Visit (INDEPENDENT_AMBULATORY_CARE_PROVIDER_SITE_OTHER): Payer: Medicare Other | Admitting: Internal Medicine

## 2011-09-01 VITALS — BP 132/78 | HR 96 | Temp 98.1°F | Resp 20 | Wt 118.0 lb

## 2011-09-01 DIAGNOSIS — E119 Type 2 diabetes mellitus without complications: Secondary | ICD-10-CM

## 2011-09-01 DIAGNOSIS — Z0000 Encounter for general adult medical examination without abnormal findings: Secondary | ICD-10-CM

## 2011-09-01 DIAGNOSIS — I10 Essential (primary) hypertension: Secondary | ICD-10-CM

## 2011-09-01 DIAGNOSIS — J45901 Unspecified asthma with (acute) exacerbation: Secondary | ICD-10-CM | POA: Insufficient documentation

## 2011-09-01 DIAGNOSIS — D72829 Elevated white blood cell count, unspecified: Secondary | ICD-10-CM

## 2011-09-01 DIAGNOSIS — J42 Unspecified chronic bronchitis: Secondary | ICD-10-CM

## 2011-09-01 DIAGNOSIS — J309 Allergic rhinitis, unspecified: Secondary | ICD-10-CM | POA: Insufficient documentation

## 2011-09-01 DIAGNOSIS — R05 Cough: Secondary | ICD-10-CM

## 2011-09-01 DIAGNOSIS — Z1231 Encounter for screening mammogram for malignant neoplasm of breast: Secondary | ICD-10-CM

## 2011-09-01 DIAGNOSIS — J209 Acute bronchitis, unspecified: Secondary | ICD-10-CM | POA: Insufficient documentation

## 2011-09-01 DIAGNOSIS — E785 Hyperlipidemia, unspecified: Secondary | ICD-10-CM

## 2011-09-01 MED ORDER — METHYLPREDNISOLONE 4 MG PO KIT: PACK | ORAL | Status: AC

## 2011-09-01 MED ORDER — OLMESARTAN MEDOXOMIL 20 MG PO TABS: 20.0000 mg | ORAL_TABLET | Freq: Every day | ORAL | Status: DC

## 2011-09-01 MED ORDER — FEXOFENADINE HCL 180 MG PO TABS: 180.0000 mg | ORAL_TABLET | Freq: Every day | ORAL | Status: AC

## 2011-09-01 NOTE — Progress Notes (Signed)
Subjective:    Patient ID: Diane Richmond, female    DOB: 08/26/1945, 66 y.o.   MRN: [id number]  Cough This is a recurrent problem. The current episode started 1 to 4 weeks ago. The problem has been gradually worsening. The problem occurs every few hours. The cough is non-productive. Associated symptoms include nasal congestion, postnasal drip, rhinorrhea and shortness of breath. Pertinent negatives include no chest pain, chills, ear congestion, ear pain, fever, headaches, heartburn, hemoptysis, myalgias, rash, sore throat, sweats, weight loss or wheezing. Exacerbated by: pollen. She has tried a beta-agonist inhaler for the symptoms. The treatment provided moderate relief. Her past medical history is significant for asthma.  Hypertension This is a chronic problem. The current episode started more than 1 year ago. The problem is unchanged. The problem is uncontrolled. Associated symptoms include shortness of breath. Pertinent negatives include no anxiety, blurred vision, chest pain, headaches, malaise/fatigue, neck pain, orthopnea, palpitations, peripheral edema, PND or sweats. Past treatments include calcium channel blockers and angiotensin blockers. The current treatment provides moderate improvement. Compliance problems include exercise, diet and psychosocial issues.       Review of Systems  Constitutional: Negative for fever, chills, weight loss, malaise/fatigue, diaphoresis, activity change, appetite change, fatigue and unexpected weight change.  HENT: Positive for congestion, rhinorrhea, sneezing and postnasal drip. Negative for hearing loss, ear pain, nosebleeds, sore throat, facial swelling, drooling, mouth sores, trouble swallowing, neck pain, neck stiffness, dental problem, voice change, sinus pressure, tinnitus and ear discharge.   Eyes: Negative.  Negative for blurred vision.  Respiratory: Positive for cough and shortness of breath. Negative for apnea, hemoptysis, choking, chest tightness,  wheezing and stridor.   Cardiovascular: Negative for chest pain, palpitations, orthopnea and PND.  Gastrointestinal: Negative for heartburn, nausea, vomiting, abdominal pain, diarrhea, constipation, blood in stool and abdominal distention.  Genitourinary: Negative.   Musculoskeletal: Negative for myalgias, back pain, joint swelling, arthralgias and gait problem.  Skin: Negative for color change, pallor, rash and wound.  Neurological: Negative for dizziness, tremors, seizures, syncope, facial asymmetry, speech difficulty, weakness, light-headedness, numbness and headaches.  Hematological: Negative for adenopathy. Does not bruise/bleed easily.  Psychiatric/Behavioral: Negative.        Objective:   Physical Exam  Vitals reviewed. Constitutional: She is oriented to person, place, and time. She appears well-developed and well-nourished.  Non-toxic appearance. She does not have a sickly appearance. She does not appear ill. No distress.  HENT:  Head: Normocephalic and atraumatic. No trismus in the jaw.  Right Ear: Hearing, tympanic membrane, external ear and ear canal normal.  Left Ear: Hearing, external ear and ear canal normal.  Nose: Mucosal edema and rhinorrhea present. No nose lacerations, sinus tenderness, nasal deformity, septal deviation or nasal septal hematoma. No epistaxis.  No foreign bodies. Right sinus exhibits no maxillary sinus tenderness and no frontal sinus tenderness. Left sinus exhibits no maxillary sinus tenderness.  Mouth/Throat: Oropharynx is clear and moist and mucous membranes are normal. Mucous membranes are not pale, not dry and not cyanotic. No uvula swelling. No oropharyngeal exudate, posterior oropharyngeal edema, posterior oropharyngeal erythema or tonsillar abscesses.  Eyes: Conjunctivae are normal. Right eye exhibits no discharge. Left eye exhibits no discharge. No scleral icterus.  Neck: Normal range of motion. Neck supple. No JVD present. No tracheal deviation  present. No thyromegaly present.  Cardiovascular: Normal rate, regular rhythm, normal heart sounds and intact distal pulses.  Exam reveals no gallop and no friction rub.   No murmur heard. Pulmonary/Chest: Effort normal and breath sounds normal.  No stridor. No respiratory distress. She has no wheezes. She has no rales. She exhibits no tenderness.  Abdominal: Soft. Bowel sounds are normal. She exhibits no distension and no mass. There is no tenderness. There is no rebound and no guarding.  Musculoskeletal: Normal range of motion. She exhibits no edema and no tenderness.  Lymphadenopathy:    She has no cervical adenopathy.  Neurological: She is oriented to person, place, and time.  Skin: Skin is warm and dry. No rash noted. She is not diaphoretic. No erythema. No pallor.  Psychiatric: She has a normal mood and affect. Her behavior is normal. Judgment and thought content normal.      Lab Results  Component Value Date   WBC 24.0 cH* 12/17/2010   HGB 12.8 12/17/2010   HCT 39.6 12/17/2010   PLT 310.0 12/17/2010   GLUCOSE 83 12/17/2010   CHOL 211* 12/17/2010   TRIG 150.0* 12/17/2010   HDL 27.70* 12/17/2010   LDLDIRECT 156.5 12/17/2010   ALT 10 12/17/2010   AST 20 12/17/2010   NA 140 12/17/2010   K 4.0 12/17/2010   CL 103 12/17/2010   CREATININE 0.6 12/17/2010   BUN 10 12/17/2010   CO2 29 12/17/2010   TSH 0.95 12/17/2010   HGBA1C 6.0 12/17/2010      Assessment & Plan:

## 2011-09-01 NOTE — Patient Instructions (Signed)
Chronic Obstructive Pulmonary Disease Exacerbation Chronic obstructive pulmonary disease (COPD) is a condition that limits airflow. COPD may include chronic bronchitis, pulmonary emphysema, or both. A COPD exacerbation means that your COPD has gotten worse. Without treatment, this can be a life-threatening problem. COPD exacerbation requires immediate medical care. CAUSES  COPD exacerbation can be caused by:  Exposure to smoke.   Exposure to air pollution, chemical fumes, or dust.   Respiratory infections.   Genetics, particularly alpha 1-antitrypsin deficiency.   A condition in which the body's immune system attacks itself (autoimmunity).  SYMPTOMS   Increased coughing.   Increased wheezing.   Increased shortness of breath.   Swelling due to a buildup of fluid (peripheral edema) related to heart strain.   Rapid breathing.   Chest enlargement (barrel chest).   Chest tightness.  DIAGNOSIS  There is no single test that can diagnosis COPD exacerbation. Your history, physical exam, and other tests will help your caregiver make a diagnosis. Tests may include a chest X-ray, pulmonary function tests, spirometry, basic lab tests, and an arterial blood gas test. TREATMENT  Severe problems may require a stay in the hospital. Depending on the cause of your problems, the following may be prescribed:  Antibiotic medicines.   Bronchodilators (inhaled or tablets).   Cortisone medicines (inhaled or tablets).   Supplemental oxygen therapy.   Pulmonary rehabilitation. This is a broad program that may involve exercise, nutrition counseling, breathing techniques, and further education about your condition.  It is important to use good technique with inhaled medicines. Spacer devices may be needed to help improve drug delivery. HOME CARE INSTRUCTIONS   Do not smoke. Quitting smoking is very important to prevent worsening of COPD.   Avoid exposure to all substances that irritate the airway,  especially tobacco smoke.   If prescribed, take your antibiotics as directed. Finish them even if you start to feel better.   Only take over-the-counter or prescription medicines as directed by your caregiver.   Drink enough fluids to keep your urine clear or pale yellow. This can help thin bronchial secretions.   Use a cool mist vaporizer. This makes it easier to clear your chest when you cough.   If you have a home nebulizer and oxygen, continue to use them as directed.   Maintain all necessary vaccinations to prevent infections.   Exercise regularly.   Eat a healthy diet.   Keep all follow-up appointments as directed by your caregiver.  SEEK IMMEDIATE MEDICAL CARE IF:  You have extreme shortness of breath.   You have severe chest pain or blood in your sputum.   You have a high fever, weakness, repeated vomiting, or fainting.   You feel confused.  MAKE SURE YOU:   Understand these instructions.   Will watch your condition.   Will get help right away if you are not doing well or get worse.  Document Released: 02/06/2007 Document Revised: 03/31/2011 Document Reviewed: 12/07/2010 [facility] Patient Information 2012 [place], [place].Diabetes, Type 2 Diabetes is a long-lasting (chronic) disease. In type 2 diabetes, the pancreas does not make enough insulin (a hormone), and the body does not respond normally to the insulin that is made. This type of diabetes was also previously called adult-onset diabetes. It usually occurs after the age of 40, but it can occur at any age.  CAUSES  Type 2 diabetes happens because the pancreasis not making enough insulin or your body has trouble using the insulin that your pancreas does make properly. SYMPTOMS  Drinking more than usual.   Urinating more than usual.   Blurred vision.   Dry, itchy skin.   Frequent infections.   Feeling more tired than usual (fatigue).  DIAGNOSIS The diagnosis of type 2 diabetes is usually made by one of  the following tests:  Fasting blood glucose test. You will not eat for at least 8 hours and then take a blood test.   Random blood glucose test. Your blood glucose (sugar) is checked at any time of the day regardless of when you ate.   Oral glucose tolerance test (OGTT). Your blood glucose is measured after you have not eaten (fasted) and then after you drink a glucose containing beverage.  TREATMENT   Healthy eating.   Exercise.   Medicine, if needed.   Monitoring blood glucose.   Seeing your caregiver regularly.  HOME CARE INSTRUCTIONS   Check your blood glucose at least once a day. More frequent monitoring may be necessary, depending on your medicines and on how well your diabetes is controlled. Your caregiver will advise you.   Take your medicine as directed by your caregiver.   Do not smoke.   Make wise food choices. Ask your caregiver for information. Weight loss can improve your diabetes.   Learn about low blood glucose (hypoglycemia) and how to treat it.   Get your eyes checked regularly.   Have a yearly physical exam. Have your blood pressure checked and your blood and urine tested.   Wear a pendant or bracelet saying that you have diabetes.   Check your feet every night for cuts, sores, blisters, and redness. Let your caregiver know if you have any problems.  SEEK MEDICAL CARE IF:   You have problems keeping your blood glucose in target range.   You have problems with your medicines.   You have symptoms of an illness that do not improve after 24 hours.   You have a sore or wound that is not healing.   You notice a change in vision or a new problem with your vision.   You have a fever.  MAKE SURE YOU:  Understand these instructions.   Will watch your condition.   Will get help right away if you are not doing well or get worse.  Document Released: 04/11/2005 Document Revised: 03/31/2011 Document Reviewed: 09/27/2010 [facility] Patient Information 2012  [place], [place].

## 2011-09-02 NOTE — Assessment & Plan Note (Signed)
I will check her CXR today to look for PNA, mass ,edema

## 2011-09-02 NOTE — Assessment & Plan Note (Signed)
She is doing well on pravachol, I will check her FLP TSH CMP today

## 2011-09-02 NOTE — Assessment & Plan Note (Signed)
She is having a flare of her symptoms so I offered her a medrol dose pak for symptom relief

## 2011-09-02 NOTE — Assessment & Plan Note (Signed)
She needs to restart benicar

## 2011-09-02 NOTE — Assessment & Plan Note (Signed)
Today I will check her a1c and will monitor her renal function

## 2011-09-02 NOTE — Assessment & Plan Note (Signed)
I will recheck her CBC today 

## 2011-09-02 NOTE — Assessment & Plan Note (Signed)
She will cont with the inhalers, will start a medrol dose pak and I have asked her to see pulm

## 2011-09-06 ENCOUNTER — Institutional Professional Consult (permissible substitution): Payer: Medicare Other | Admitting: Internal Medicine

## 2011-10-31 ENCOUNTER — Ambulatory Visit: Payer: Medicare Other | Admitting: *Deleted

## 2011-11-07 ENCOUNTER — Ambulatory Visit (INDEPENDENT_AMBULATORY_CARE_PROVIDER_SITE_OTHER): Payer: Medicare Other | Admitting: Internal Medicine

## 2011-11-07 ENCOUNTER — Encounter: Payer: Self-pay | Admitting: Internal Medicine

## 2011-11-07 ENCOUNTER — Other Ambulatory Visit (INDEPENDENT_AMBULATORY_CARE_PROVIDER_SITE_OTHER): Payer: Medicare Other

## 2011-11-07 VITALS — BP 172/90 | HR 93 | Temp 98.5°F | Resp 16 | Wt 117.5 lb

## 2011-11-07 DIAGNOSIS — D72829 Elevated white blood cell count, unspecified: Secondary | ICD-10-CM

## 2011-11-07 DIAGNOSIS — I10 Essential (primary) hypertension: Secondary | ICD-10-CM

## 2011-11-07 DIAGNOSIS — E785 Hyperlipidemia, unspecified: Secondary | ICD-10-CM

## 2011-11-07 DIAGNOSIS — J42 Unspecified chronic bronchitis: Secondary | ICD-10-CM

## 2011-11-07 DIAGNOSIS — J209 Acute bronchitis, unspecified: Secondary | ICD-10-CM

## 2011-11-07 DIAGNOSIS — E119 Type 2 diabetes mellitus without complications: Secondary | ICD-10-CM

## 2011-11-07 LAB — LDL CHOLESTEROL, DIRECT: Direct LDL: 186.3 mg/dL

## 2011-11-07 LAB — CBC WITH DIFFERENTIAL/PLATELET
Basophils Absolute: 0.1 10*3/uL (ref 0.0–0.1)
Eosinophils Relative: 22 % — ABNORMAL HIGH (ref 0.0–5.0)
Lymphs Abs: 2.3 10*3/uL (ref 0.7–4.0)
Monocytes Absolute: 0.6 10*3/uL (ref 0.1–1.0)
Monocytes Relative: 6.5 % (ref 3.0–12.0)
Neutrophils Relative %: 47 % (ref 43.0–77.0)
Platelets: 260 10*3/uL (ref 150.0–400.0)
RDW: 13.4 % (ref 11.5–14.6)
WBC: 9.7 10*3/uL (ref 4.5–10.5)

## 2011-11-07 LAB — URINALYSIS, ROUTINE W REFLEX MICROSCOPIC
Bilirubin Urine: NEGATIVE
Hgb urine dipstick: NEGATIVE
Ketones, ur: NEGATIVE
Total Protein, Urine: NEGATIVE
Urine Glucose: NEGATIVE
pH: 5.5 (ref 5.0–8.0)

## 2011-11-07 LAB — COMPREHENSIVE METABOLIC PANEL
ALT: 12 U/L (ref 0–35)
Albumin: 3.8 g/dL (ref 3.5–5.2)
Alkaline Phosphatase: 64 U/L (ref 39–117)
CO2: 29 mEq/L (ref 19–32)
Glucose, Bld: 140 mg/dL — ABNORMAL HIGH (ref 70–99)
Potassium: 4 mEq/L (ref 3.5–5.1)
Sodium: 140 mEq/L (ref 135–145)
Total Bilirubin: 0.5 mg/dL (ref 0.3–1.2)
Total Protein: 7.9 g/dL (ref 6.0–8.3)

## 2011-11-07 LAB — LIPID PANEL
Cholesterol: 266 mg/dL — ABNORMAL HIGH (ref 0–200)
HDL: 36.3 mg/dL — ABNORMAL LOW (ref >39.00)
Total CHOL/HDL Ratio: 7
Triglycerides: 130 mg/dL (ref 0.0–149.0)

## 2011-11-07 LAB — HEMOGLOBIN A1C: Hgb A1c MFr Bld: 5.9 % (ref 4.6–6.5)

## 2011-11-07 MED ORDER — COLESEVELAM HCL 3.75 G PO PACK: 1.0000 | PACK | Freq: Every day | ORAL | Status: DC

## 2011-11-07 MED ORDER — METHYLPREDNISOLONE ACETATE 80 MG/ML IJ SUSP
120.0000 mg | Freq: Once | INTRAMUSCULAR | Status: AC
  Administered 2011-11-07: 120 mg via INTRAMUSCULAR

## 2011-11-07 NOTE — Assessment & Plan Note (Signed)
She is having frequent exacerbations so I have asked her to see pulmonary for further evaluation, I will treat the symptoms today with an injection of depo-medrol IM

## 2011-11-07 NOTE — Patient Instructions (Signed)
Diabetes, Type 2 Diabetes is a long-lasting (chronic) disease. In type 2 diabetes, the pancreas does not make enough insulin (a hormone), and the body does not respond normally to the insulin that is made. This type of diabetes was also previously called adult-onset diabetes. It usually occurs after the age of 40, but it can occur at any age.  CAUSES  Type 2 diabetes happens because the pancreasis not making enough insulin or your body has trouble using the insulin that your pancreas does make properly. SYMPTOMS   Drinking more than usual.   Urinating more than usual.   Blurred vision.   Dry, itchy skin.   Frequent infections.   Feeling more tired than usual (fatigue).  DIAGNOSIS The diagnosis of type 2 diabetes is usually made by one of the following tests:  Fasting blood glucose test. You will not eat for at least 8 hours and then take a blood test.   Random blood glucose test. Your blood glucose (sugar) is checked at any time of the day regardless of when you ate.   Oral glucose tolerance test (OGTT). Your blood glucose is measured after you have not eaten (fasted) and then after you drink a glucose containing beverage.  TREATMENT   Healthy eating.   Exercise.   Medicine, if needed.   Monitoring blood glucose.   Seeing your caregiver regularly.  HOME CARE INSTRUCTIONS   Check your blood glucose at least once a day. More frequent monitoring may be necessary, depending on your medicines and on how well your diabetes is controlled. Your caregiver will advise you.   Take your medicine as directed by your caregiver.   Do not smoke.   Make wise food choices. Ask your caregiver for information. Weight loss can improve your diabetes.   Learn about low blood glucose (hypoglycemia) and how to treat it.   Get your eyes checked regularly.   Have a yearly physical exam. Have your blood pressure checked and your blood and urine tested.   Wear a pendant or bracelet saying  that you have diabetes.   Check your feet every night for cuts, sores, blisters, and redness. Let your caregiver know if you have any problems.  SEEK MEDICAL CARE IF:   You have problems keeping your blood glucose in target range.   You have problems with your medicines.   You have symptoms of an illness that do not improve after 24 hours.   You have a sore or wound that is not healing.   You notice a change in vision or a new problem with your vision.   You have a fever.  MAKE SURE YOU:  Understand these instructions.   Will watch your condition.   Will get help right away if you are not doing well or get worse.  Document Released: 04/11/2005 Document Revised: 03/31/2011 Document Reviewed: 09/27/2010 [employer] Patient Information 2012 [employer].Asthma, Adult Asthma is caused by narrowing of the air passages in the lungs. It may be triggered by pollen, dust, animal dander, molds, some foods, respiratory infections, exposure to smoke, exercise, emotional stress or other allergens (things that cause allergic reactions or allergies). Repeat attacks are common. HOME CARE INSTRUCTIONS   Use prescription medications as ordered by your caregiver.   Avoid pollen, dust, animal dander, molds, smoke and other things that cause attacks at home and at work.   You may have fewer attacks if you decrease dust in your home. Electrostatic air cleaners may help.   It may help   to replace your pillows or mattress with materials less likely to cause allergies.   Talk to your caregiver about an action plan for managing asthma attacks at home, including, the use of a peak flow meter which measures the severity of your asthma attack. An action plan can help minimize or stop the attack without having to seek medical care.   If you are not on a fluid restriction, drink 8 to 10 glasses of water each day.   Always have a plan prepared for seeking medical attention, including, calling your physician,  accessing local emergency care, and calling 911 (in the [place] for a severe attack.   Discuss possible exercise routines with your caregiver.   If animal dander is the cause of asthma, you may need to get rid of pets.  SEEK MEDICAL CARE IF:   You have wheezing and shortness of breath even if taking medicine to prevent attacks.   You have muscle aches, chest pain or thickening of sputum.   Your sputum changes from clear or white to yellow, green, gray, or bloody.   You have any problems that may be related to the medicine you are taking (such as a rash, itching, swelling or trouble breathing).  SEEK IMMEDIATE MEDICAL CARE IF:   Your usual medicines do not stop your wheezing or there is increased coughing and/or shortness of breath.   You have increased difficulty breathing.   You have a fever.  MAKE SURE YOU:   Understand these instructions.   Will watch your condition.   Will get help right away if you are not doing well or get worse.  Document Released: 04/11/2005 Document Revised: 03/31/2011 Document Reviewed: 11/28/2007 [employer] Patient Information 2012 [employer]. 
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
## 2011-11-07 NOTE — Addendum Note (Signed)
Addended by: Etta Grandchild on: 11/07/2011 01:43 PM   Modules accepted: Orders

## 2011-11-07 NOTE — Assessment & Plan Note (Signed)
I will check her a1c today and will monitor her renal function 

## 2011-11-07 NOTE — Assessment & Plan Note (Signed)
Her BP is not well controlled due to non-compliance, she agrees to be more compliant, today I will check her labs to look for secondary and end organ damage

## 2011-11-07 NOTE — Assessment & Plan Note (Signed)
I will recheck her CBC today and have ordered an SPEP to see if she has lymphoproliferative disease

## 2011-11-10 ENCOUNTER — Institutional Professional Consult (permissible substitution): Payer: Medicare Other | Admitting: Internal Medicine

## 2011-11-10 ENCOUNTER — Other Ambulatory Visit: Payer: Self-pay

## 2011-11-10 LAB — PROTEIN ELECTROPHORESIS, SERUM
Albumin ELP: 54.3 % — ABNORMAL LOW (ref 55.8–66.1)
Alpha-1-Globulin: 3.6 % (ref 2.9–4.9)
Alpha-2-Globulin: 10.3 % (ref 7.1–11.8)
Beta 2: 6.5 % (ref 3.2–6.5)
Beta Globulin: 5.2 % (ref 4.7–7.2)

## 2011-11-10 MED ORDER — METFORMIN HCL 500 MG PO TABS: ORAL_TABLET | ORAL | Status: DC

## 2011-11-21 ENCOUNTER — Other Ambulatory Visit: Payer: Self-pay

## 2011-11-21 DIAGNOSIS — I10 Essential (primary) hypertension: Secondary | ICD-10-CM

## 2011-11-21 DIAGNOSIS — E119 Type 2 diabetes mellitus without complications: Secondary | ICD-10-CM

## 2011-11-21 MED ORDER — AMLODIPINE BESYLATE 5 MG PO TABS: 5.0000 mg | ORAL_TABLET | Freq: Every day | ORAL | Status: DC

## 2011-11-25 ENCOUNTER — Institutional Professional Consult (permissible substitution): Payer: Medicare Other | Admitting: Internal Medicine

## 2012-01-20 ENCOUNTER — Encounter: Payer: Self-pay | Admitting: Internal Medicine

## 2012-01-20 ENCOUNTER — Ambulatory Visit (INDEPENDENT_AMBULATORY_CARE_PROVIDER_SITE_OTHER): Payer: Medicare Other | Admitting: Internal Medicine

## 2012-01-20 VITALS — BP 146/78 | HR 81 | Temp 96.9°F | Resp 16 | Wt 119.8 lb

## 2012-01-20 DIAGNOSIS — E119 Type 2 diabetes mellitus without complications: Secondary | ICD-10-CM

## 2012-01-20 DIAGNOSIS — I10 Essential (primary) hypertension: Secondary | ICD-10-CM

## 2012-01-20 DIAGNOSIS — J209 Acute bronchitis, unspecified: Secondary | ICD-10-CM

## 2012-01-20 DIAGNOSIS — J42 Unspecified chronic bronchitis: Secondary | ICD-10-CM

## 2012-01-20 MED ORDER — PREDNISONE (PAK) 10 MG PO TABS: 10.0000 mg | ORAL_TABLET | ORAL | Status: DC

## 2012-01-20 MED ORDER — AZITHROMYCIN 250 MG PO TABS: ORAL_TABLET | ORAL | Status: AC

## 2012-01-20 MED ORDER — METHYLPREDNISOLONE ACETATE 80 MG/ML IJ SUSP
120.0000 mg | Freq: Once | INTRAMUSCULAR | Status: AC
  Administered 2012-01-20: 120 mg via INTRAMUSCULAR

## 2012-01-20 NOTE — Progress Notes (Signed)
  Subjective:   HPI  complains of continued chest cold symptoms  Onset >2 week ago - wax/wane intensity Initially associated with rhinorrhea, sneezing, sore throat, mild headache and low grade fever Now cough, wheeze, shortness of breath and mild-mod chest congestion No relief with OTC meds - Denies sick contacts  Past Medical History  Diagnosis Date  . Hypertension   . Hyperlipidemia   . Diabetes mellitus     type II  . HX: breast cancer   . Asthma     Review of Systems Constitutional: No night sweats, no unexpected weight change Pulmonary: No pleurisy or hemoptysis Cardiovascular: No chest pain or palpitations     Objective:   Physical Exam BP 146/78  Pulse 81  Temp 96.9 F (36.1 C) (Oral)  Resp 16  Wt 119 lb 12 oz (54.318 kg)  SpO2 96% GEN: mildly ill appearing and audible chest congestion - dtr at side HENT: NCAT, no sinus tenderness bilaterally, nares with clear discharge, TMs clear, oropharynx mild erythema, no exudate Eyes: Vision grossly intact, no conjunctivitis Lungs: Bilateral scattered rhonchi - diffuse expiratory wheeze, no increased work of breathing at rest Cardiovascular: Regular rate and rhythm, no bilateral edema  Lab Results  Component Value Date   WBC 9.7 11/07/2011   HGB 13.6 11/07/2011   HCT 41.4 11/07/2011   PLT 260.0 11/07/2011   GLUCOSE 140* 11/07/2011   CHOL 266* 11/07/2011   TRIG 130.0 11/07/2011   HDL 36.30* 11/07/2011   LDLDIRECT 186.3 11/07/2011   ALT 12 11/07/2011   AST 19 11/07/2011   NA 140 11/07/2011   K 4.0 11/07/2011   CL 103 11/07/2011   CREATININE 0.8 11/07/2011   BUN 19 11/07/2011   CO2 29 11/07/2011   TSH 0.74 11/07/2011   HGBA1C 5.9 11/07/2011      Assessment & Plan:  acute exacerbation of chronic bronchitis -  Persisting intrinsic asthma Cough, postnasal drip related to above DM2    Reviewed indication and importance of asthma medications - rescue Alb prn and scheduled Symbicort IM Medrol today and pred taper Empiric  antibiotics prescribed due to symptom duration greater than 7 days Symptomatic care with Tylenol or Advil, hydration and rest -  salt gargle advised as needed patient advised on anticipated increase in cbgs due to steroid effect for next several days - pt will call if cbg>200

## 2012-01-20 NOTE — Patient Instructions (Signed)
It was good to see you today. We have reviewed your usual home medications today and clarified the purpose of each Use albuterol rescue inhaler 2 puffs every 6 hours as needed for wheeze, cough or shortness breath Steroid shot given in office today Zpak antibiotics and pred taper - Your prescription(s) have been submitted to your pharmacy. Please take as directed and contact our office if you believe you are having problem(s) with the medication(s). Call if symptoms unimproved in 7-10 days, sooner if worse Watch your sugars and call if over 200 in next few days - steroids will increase your sugars more than usual for next few days!

## 2012-01-20 NOTE — Assessment & Plan Note (Signed)
BP Readings from Last 3 Encounters:  01/20/12 146/78  11/07/11 172/90  09/01/11 132/78   The current medical regimen is effective;  continue present plan and medications.

## 2012-02-01 ENCOUNTER — Telehealth: Payer: Self-pay | Admitting: Internal Medicine

## 2012-02-01 NOTE — Telephone Encounter (Signed)
Yes from my standpoint

## 2012-02-01 NOTE — Telephone Encounter (Signed)
Patients daughter is not happy with her mother seeing Dr Yetta Barre and she would like to switch her care to Dr Debby Bud, please advise

## 2012-02-02 NOTE — Telephone Encounter (Signed)
I will pass on accepting this patient. Perhaps one of my colleagues

## 2012-02-07 NOTE — Telephone Encounter (Signed)
Made daughter aware of Dr. Debby Bud decision, she will speak with the patient about what she would like to do now and call back

## 2012-02-14 ENCOUNTER — Encounter: Payer: Self-pay | Admitting: *Deleted

## 2012-02-14 NOTE — Progress Notes (Signed)
[facility]  Clinical Social Work  Clinical Social Work met with patient and patient's daughter in CSW office to complete healthcare advance directives. Diane Richmond completed the healthcare living will and healthcare POA.  She designated her daughter, Diane Richmond, as her healthcare agent.  CSW will send documents to medical records to be scanned into patient's chart. CSW also made copies for patient to share with healthcare providers and healthcare agent.  CSW encouraged patient to call with any additional questions or concerns.  Kathrin Penner, MSW, LCSW Clinical Social Worker [facility] [phone]

## 2012-02-20 ENCOUNTER — Telehealth: Payer: Self-pay | Admitting: Internal Medicine

## 2012-02-20 NOTE — Telephone Encounter (Signed)
Diane Richmond's daughter, Diane Richmond is requesting that Dr. Debby Bud become her doctor.

## 2012-02-20 NOTE — Telephone Encounter (Signed)
I did say I would do that.

## 2012-02-20 NOTE — Telephone Encounter (Signed)
APPT MADE FOR NOV 7 WITH DR Debby Bud.  DAUGHTER IS AWARE.

## 2012-02-24 ENCOUNTER — Encounter (HOSPITAL_COMMUNITY): Payer: Self-pay | Admitting: *Deleted

## 2012-02-24 ENCOUNTER — Emergency Department (HOSPITAL_COMMUNITY)
Admission: EM | Admit: 2012-02-24 | Discharge: 2012-02-24 | Disposition: A | Discharge status: Home / Self Care | Payer: Medicare Other | Source: Home / Self Care | Attending: Emergency Medicine | Admitting: Emergency Medicine

## 2012-02-24 DIAGNOSIS — S76319A Strain of muscle, fascia and tendon of the posterior muscle group at thigh level, unspecified thigh, initial encounter: Secondary | ICD-10-CM

## 2012-02-24 DIAGNOSIS — IMO0002 Reserved for concepts with insufficient information to code with codable children: Secondary | ICD-10-CM

## 2012-02-24 HISTORY — DX: Bronchitis, not specified as acute or chronic: J40

## 2012-02-24 MED ORDER — TRAMADOL HCL 50 MG PO TABS: 50.0000 mg | ORAL_TABLET | Freq: Four times a day (QID) | ORAL | Status: DC | PRN

## 2012-02-24 MED ORDER — NAPROXEN 375 MG PO TABS: 375.0000 mg | ORAL_TABLET | Freq: Two times a day (BID) | ORAL | Status: DC

## 2012-02-24 NOTE — ED Notes (Signed)
States it hurts to stand on her leg.

## 2012-02-24 NOTE — ED Provider Notes (Signed)
Medical screening examination/treatment/procedure(s) were performed by non-physician practitioner and as supervising physician I was immediately available for consultation/collaboration.  Katharina Jehle, M.D.   Ellijah Leffel C Kashmir Lysaght, MD 02/24/12 2223 

## 2012-02-24 NOTE — ED Provider Notes (Signed)
History     CSN: [id number]  Arrival date & time 02/24/12  1900   First MD Initiated Contact with Patient 02/24/12 1900      Chief Complaint  Patient presents with  . Hip Pain    (Consider location/radiation/quality/duration/timing/severity/associated sxs/prior treatment) HPI Comments: 66 year old female was lifting a trash can last night and suddenly experienced pain in her right buttock and back of the right thigh. The pain runs from her mid buttock down the back of her thigh. The knee does not hurt her she had ankle or foot pain she denies pain in the hip joint and denies decreased range of motion. She did not fall and there was no blunt trauma.   Past Medical History  Diagnosis Date  . Hypertension   . Hyperlipidemia   . Diabetes mellitus     type II  . HX: breast cancer   . Asthma   . Bronchitis     Past Surgical History  Procedure Date  . Breast lumpectomy 1994    left  . Tonsillectomy   . Right port-a-cath 1994    Family History  Problem Relation Age of Onset  . Cancer Neg Hx   . Heart disease Neg Hx   . Breast cancer    . Diabetes    . Hypertension      History  Substance Use Topics  . Smoking status: Never Smoker   . Smokeless tobacco: Never Used  . Alcohol Use: No    OB History    Grav Para Term Preterm Abortions TAB SAB Ect Mult Living                  Review of Systems  Constitutional: Negative for fever, chills and activity change.  HENT: Negative.   Respiratory: Negative.   Cardiovascular: Negative.   Musculoskeletal:       As per HPI  Skin: Negative for color change, pallor and rash.  Neurological: Negative.     Allergies  Atorvastatin and Promethazine-codeine  Home Medications   Current Outpatient Rx  Name Route Sig Dispense Refill  . ALBUTEROL SULFATE HFA 108 (90 BASE) MCG/ACT IN AERS Inhalation Inhale 2 puffs into the lungs every 6 (six) hours as needed for wheezing or shortness of breath. 1 Inhaler 0  . AMLODIPINE  BESYLATE 5 MG PO TABS Oral Take 1 tablet (5 mg total) by mouth daily. 30 tablet 0  . ASPIRIN 81 MG PO TABS Oral Take 81 mg by mouth daily.      . BUDESONIDE-FORMOTEROL FUMARATE 160-4.5 MCG/ACT IN AERO Inhalation Inhale 2 puffs into the lungs 2 (two) times daily. 1 Inhaler 12  . FEXOFENADINE HCL 180 MG PO TABS Oral Take 1 tablet (180 mg total) by mouth daily. 90 tablet 3  . METFORMIN HCL 500 MG PO TABS  TAKE 1 TABLET (500 MG TOTAL) BY MOUTH 2 (TWO) TIMES DAILY. 60 tablet 6  . OLMESARTAN MEDOXOMIL 20 MG PO TABS Oral Take 1 tablet (20 mg total) by mouth daily. 90 tablet 3  . PRAVASTATIN SODIUM 40 MG PO TABS  TAKE 1 TABLET DAILY 90 tablet 2  . ACCU-CHEK NANO SMARTVIEW W/DEVICE KIT Does not apply by Does not apply route.      . COLESEVELAM HCL 3.75 G PO PACK Oral Take 1 each by mouth daily. 90 each 3  . GLUCOSE BLOOD VI STRP Other 1 each by Other route as needed for other. 1/day, and lancets 1/day 250.00 100 each 3  . NAPROXEN 375  MG PO TABS Oral Take 1 tablet (375 mg total) by mouth 2 (two) times daily. 20 tablet 0  . PREDNISONE (PAK) 10 MG PO TABS Oral Take 1 tablet (10 mg total) by mouth as directed. As directed x 6 days 21 tablet 0  . TRAMADOL HCL 50 MG PO TABS Oral Take 1 tablet (50 mg total) by mouth every 6 (six) hours as needed for pain. 20 tablet 0    BP 148/69  Pulse 95  Temp 99.1 F (37.3 C) (Oral)  Resp 16  SpO2 100%  Physical Exam  Constitutional: She is oriented to person, place, and time. She appears well-developed and well-nourished. No distress.  HENT:  Head: Normocephalic and atraumatic.  Eyes: EOM are normal.  Neck: Normal range of motion. Neck supple.  Pulmonary/Chest: Effort normal. No respiratory distress.  Abdominal: There is no tenderness.  Musculoskeletal: Normal range of motion. She exhibits tenderness. She exhibits no edema.       Point tenderness at mid buttock with there is also a mounded area soft tissue consistent with muscle spasm. There is also tenderness  along the tendons of the hamstring musculature. She ambulates with a linear is able to flex her hip at the waist. There is no bony or other tenderness in the hip joint.  Neurological: She is alert and oriented to person, place, and time. No cranial nerve deficit.  Skin: Skin is warm and dry.  Psychiatric: She has a normal mood and affect.    ED Course  Procedures (including critical care time)  Labs Reviewed - No data to display No results found.   1. Hamstring muscle strain       MDM  Rest and stretches is demonstrated. Apply heat off and on several times during the day Naprosyn 375 mg twice a day p.c. when necessary pain Ultram 50 mg one every 6 hours when necessary pain        Hayden Rasmussen, NP 02/24/12 2125  Hayden Rasmussen, NP 02/24/12 2126

## 2012-02-24 NOTE — ED Notes (Signed)
C/o R hip pain after lifting the trash can yesterday.  Hurts move, and when she lays on it.  Pain radiates down her R post. thigh.

## 2012-03-01 ENCOUNTER — Ambulatory Visit (INDEPENDENT_AMBULATORY_CARE_PROVIDER_SITE_OTHER): Payer: Medicare Other | Admitting: Internal Medicine

## 2012-03-01 ENCOUNTER — Other Ambulatory Visit (INDEPENDENT_AMBULATORY_CARE_PROVIDER_SITE_OTHER): Payer: Medicare Other

## 2012-03-01 ENCOUNTER — Other Ambulatory Visit: Payer: Medicare Other

## 2012-03-01 ENCOUNTER — Encounter: Payer: Self-pay | Admitting: Internal Medicine

## 2012-03-01 VITALS — BP 130/80 | HR 79 | Temp 96.5°F | Resp 16 | Wt 116.0 lb

## 2012-03-01 DIAGNOSIS — I10 Essential (primary) hypertension: Secondary | ICD-10-CM

## 2012-03-01 DIAGNOSIS — E119 Type 2 diabetes mellitus without complications: Secondary | ICD-10-CM

## 2012-03-01 DIAGNOSIS — J45909 Unspecified asthma, uncomplicated: Secondary | ICD-10-CM

## 2012-03-01 DIAGNOSIS — I7090 Unspecified atherosclerosis: Secondary | ICD-10-CM

## 2012-03-01 DIAGNOSIS — E785 Hyperlipidemia, unspecified: Secondary | ICD-10-CM

## 2012-03-01 DIAGNOSIS — R0989 Other specified symptoms and signs involving the circulatory and respiratory systems: Secondary | ICD-10-CM

## 2012-03-01 DIAGNOSIS — R079 Chest pain, unspecified: Secondary | ICD-10-CM

## 2012-03-01 LAB — HEPATIC FUNCTION PANEL
Albumin: 3.5 g/dL (ref 3.5–5.2)
Alkaline Phosphatase: 61 U/L (ref 39–117)
Bilirubin, Direct: 0.1 mg/dL (ref 0.0–0.3)

## 2012-03-01 LAB — COMPREHENSIVE METABOLIC PANEL
BUN: 13 mg/dL (ref 6–23)
CO2: 30 mEq/L (ref 19–32)
Calcium: 9.5 mg/dL (ref 8.4–10.5)
Chloride: 104 mEq/L (ref 96–112)
Creatinine, Ser: 0.6 mg/dL (ref 0.4–1.2)
GFR: 119.44 mL/min (ref >60.00)

## 2012-03-01 LAB — LIPID PANEL: Cholesterol: 315 mg/dL — ABNORMAL HIGH (ref 0–200)

## 2012-03-01 MED ORDER — ATORVASTATIN CALCIUM 40 MG PO TABS: 40.0000 mg | ORAL_TABLET | Freq: Every day | ORAL | Status: DC

## 2012-03-01 NOTE — Patient Instructions (Addendum)
Thanks for coming to see me.  1. Diabetes - last A1C was 5.9% - very good  2. High cholesterol - way to high at last test in July. It is very important to get this down due to risk for heart disease, including family history Plan Continue Welchol once a day - powder in juice   Stop Pravachol  Start Lipitor 40 mg once a day  Lab work in 4 weeks   3. Heart and circulation - you have mild symptoms of chest pain and decreased exercise tolerance (fatigue) which may be heart disease. There is also a Bruit (sounds) in the carotid artery (neck) Plan Nuclear stress test to look for blocker coronary arteries  Carotid doppler exam to look for any blockage in the neck arteries.  4. Asthma - you need to use the Symbicort twice a day every day.   Use the albuterol 2 puffs every 6 hours as needed for breakthrough wheezing.  5. Roaring in the ear - may be due to the ear wax build up. The dizziness can be related. Plan - ear washed today  For persistent dizziness you can take over the counter Bonine 1/2 tablet as needed for bad dizziness.  You will come back to see me after the carotid doppler and nuclear stress test.

## 2012-03-01 NOTE — Progress Notes (Signed)
Subjective:    Patient ID: Diane Richmond, female    DOB: 12-May-1945, 66 y.o.   MRN: [id number]  HPI Diane Richmond presents to establish for on-going continuity care. Her cc: low energy - she has to make herself do her usual activities, but can do them. She also feels that she has intermittent dizziness. She has a roaring in her ears - for about the last two weeks. She wonders if this is related to sinus problems.  Chart reviewed: in July '13 her LDL was 186, HDL 36.  Past Medical History  Diagnosis Date  . Hypertension   . Hyperlipidemia   . Diabetes mellitus     type II  . HX: breast cancer   . Asthma   . Bronchitis    Past Surgical History  Procedure Date  . Breast lumpectomy 1994    left  . Tonsillectomy   . Right port-a-cath 1994   Family History  Problem Relation Age of Onset  . Cancer Neg Hx   . Breast cancer    . Diabetes    . Hypertension    . Heart disease Mother     CAD/MI  . Aneurysm Father   . Stroke Sister   . Hypertension Sister    History   Social History  . Marital Status: Married    Spouse Name: N/A    Number of Children: 4  . Years of Education: 8   Occupational History  . nurse assistant    Social History Main Topics  . Smoking status: Never Smoker   . Smokeless tobacco: Former Neurosurgeon    Quit date: 03/02/1963  . Alcohol Use: No  . Drug Use: No  . Sexually Active: Not Currently   Other Topics Concern  . Not on file   Social History Narrative   8th grade. Married '67 - .  4 dtrs - '67, '70, '72, '78. Grandchildren - 4. 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 Lives with husband.     Current Outpatient Prescriptions on File Prior to Visit  Medication Sig Dispense Refill  . albuterol (PROVENTIL HFA;VENTOLIN HFA) 108 (90 BASE) MCG/ACT inhaler Inhale 2 puffs into the lungs every 6 (six) hours as needed for wheezing or shortness of breath.  1 Inhaler  0  . amLODipine (NORVASC) 5 MG tablet Take 1 tablet (5 mg total) by mouth daily.  30 tablet  0  . aspirin  81 MG tablet Take 81 mg by mouth daily.        . Blood Glucose Monitoring Suppl (ACCU-CHEK NANO SMARTVIEW) W/DEVICE KIT by Does not apply route.        . budesonide-formoterol (SYMBICORT) 160-4.5 MCG/ACT inhaler Inhale 2 puffs into the lungs 2 (two) times daily.  1 Inhaler  12  . Colesevelam HCl 3.75 G PACK Take 1 each by mouth daily.  90 each  3  . fexofenadine (ALLEGRA) 180 MG tablet Take 1 tablet (180 mg total) by mouth daily.  90 tablet  3  . glucose blood test strip 1 each by Other route as needed for other. 1/day, and lancets 1/day 250.00  100 each  3  . metFORMIN (GLUCOPHAGE) 500 MG tablet TAKE 1 TABLET (500 MG TOTAL) BY MOUTH 2 (TWO) TIMES DAILY.  60 tablet  6  . naproxen (NAPROSYN) 375 MG tablet Take 1 tablet (375 mg total) by mouth 2 (two) times daily.  20 tablet  0  . olmesartan (BENICAR) 20 MG tablet Take 1 tablet (20 mg total) by mouth  daily.  90 tablet  3  . pravastatin (PRAVACHOL) 40 MG tablet TAKE 1 TABLET DAILY  90 tablet  2  . predniSONE (STERAPRED UNI-PAK) 10 MG tablet Take 1 tablet (10 mg total) by mouth as directed. As directed x 6 days  21 tablet  0  . traMADol (ULTRAM) 50 MG tablet Take 1 tablet (50 mg total) by mouth every 6 (six) hours as needed for pain.  20 tablet  0     Review of Systems Constitutional:  Negative for fever, chills, activity change and unexpected weight change.  HEENT:  Negative for hearing loss, ear pain, congestion, neck stiffness and postnasal drip. Negative for sore throat or swallowing problems. Negative for dental complaints.   Eyes: Negative for vision loss or change in visual acuity.  Respiratory: Negative for chest tightness and wheezing. Negative for DOE.   Cardiovascular: Negative for chest pain or palpitations. No decreased exercise tolerance Gastrointestinal: No change in bowel habit. No bloating or gas. No reflux or indigestion Genitourinary: Negative for urgency, frequency, flank pain and difficulty urinating.  Musculoskeletal:  Negative for myalgias, back pain, arthralgias and gait problem.  Neurological: Negative for dizziness, tremors, weakness and headaches.  Hematological: Negative for adenopathy.  Psychiatric/Behavioral: Negative for behavioral problems and dysphoric mood.      Objective:   Physical Exam Filed Vitals:   03/01/12 1004  BP: 130/80  Pulse: 79  Temp: 96.5 F (35.8 C)  Resp: 16   Wt Readings from Last 3 Encounters:  03/01/12 116 lb (52.617 kg)  01/20/12 119 lb 12 oz (54.318 kg)  11/07/11 117 lb 8 oz (53.298 kg)   Gen'l- thin AA woman in no distress HEENT_ C&S clear, bilateral cerumen impaction Cor- 2+ radial, I-II/VI systolic murmur best at RSB; carotid bruit L>R, RRR Pulm - clear lungs       Assessment & Plan:  Cerumen impaction - successfully irrigated by CMA

## 2012-03-04 NOTE — Assessment & Plan Note (Signed)
Total cholesterol 315; LDL 246!!  Plan  Continue Welchol  D/C Prevastatin and start atrovastatin 40 mg  Follow-up lab 1 month

## 2012-03-04 NOTE — Assessment & Plan Note (Signed)
Patient with metabolic syndrome presenting with fatigue which on questioning includes increased DOE, minimal chest pressure with exertion along with decreased work tolerance. On exam she had carotid bruit   Plan Myoview perfusion stress test  Carotid dopplers  Instructed that for any increase in chest pressure to seek immediate attention either at the office or [facility] ED

## 2012-03-04 NOTE — Assessment & Plan Note (Signed)
Lab Results  Component Value Date   HGBA1C 6.0 03/01/2012   Excellent control on present medication.

## 2012-03-04 NOTE — Assessment & Plan Note (Signed)
Patient with long standing asthma. She has not been on a routine maintenance regime. She is instructed todays as to long term maintenance therapy - symbicort twice daily every day and rescue treatment with albuterol 2 puffs as needed for break-through wheezing. She, and her daughter, do voice understanding of this regimen

## 2012-03-04 NOTE — Assessment & Plan Note (Signed)
BP Readings from Last 3 Encounters:  03/01/12 130/80  02/24/12 148/69  01/20/12 146/78   Border line control.   Plan At next visit will recheck - if not controlled will either increase amlodipine or add diuretic.

## 2012-03-05 ENCOUNTER — Encounter: Payer: Self-pay | Admitting: Internal Medicine

## 2012-03-06 ENCOUNTER — Other Ambulatory Visit: Payer: Self-pay | Admitting: Internal Medicine

## 2012-03-06 DIAGNOSIS — E119 Type 2 diabetes mellitus without complications: Secondary | ICD-10-CM

## 2012-03-06 DIAGNOSIS — E785 Hyperlipidemia, unspecified: Secondary | ICD-10-CM

## 2012-03-06 DIAGNOSIS — I10 Essential (primary) hypertension: Secondary | ICD-10-CM

## 2012-03-06 MED ORDER — COLESEVELAM HCL 3.75 G PO PACK: 1.0000 | PACK | Freq: Every day | ORAL | Status: DC

## 2012-03-06 MED ORDER — OLMESARTAN MEDOXOMIL 20 MG PO TABS: 20.0000 mg | ORAL_TABLET | Freq: Every day | ORAL | Status: DC

## 2012-03-06 NOTE — Telephone Encounter (Signed)
MEDICATIONS REFILLED AND SENT TO [facility]. BENICAR AND COLESEVELAM

## 2012-03-06 NOTE — Telephone Encounter (Signed)
The pt's daughter called hoping to get a refill of two medications   benicar 20mg   Colesevelam hcl 3.75g

## 2012-03-09 ENCOUNTER — Other Ambulatory Visit: Payer: Self-pay | Admitting: Cardiology

## 2012-03-09 DIAGNOSIS — R0989 Other specified symptoms and signs involving the circulatory and respiratory systems: Secondary | ICD-10-CM

## 2012-03-14 ENCOUNTER — Ambulatory Visit (HOSPITAL_COMMUNITY): Payer: Medicare Other | Attending: Internal Medicine | Admitting: Radiology

## 2012-03-14 VITALS — BP 155/80 | Ht 62.0 in | Wt 114.0 lb

## 2012-03-14 DIAGNOSIS — R42 Dizziness and giddiness: Secondary | ICD-10-CM | POA: Insufficient documentation

## 2012-03-14 DIAGNOSIS — E119 Type 2 diabetes mellitus without complications: Secondary | ICD-10-CM | POA: Insufficient documentation

## 2012-03-14 DIAGNOSIS — R079 Chest pain, unspecified: Secondary | ICD-10-CM | POA: Insufficient documentation

## 2012-03-14 DIAGNOSIS — R0609 Other forms of dyspnea: Secondary | ICD-10-CM | POA: Insufficient documentation

## 2012-03-14 DIAGNOSIS — R5381 Other malaise: Secondary | ICD-10-CM | POA: Insufficient documentation

## 2012-03-14 DIAGNOSIS — Z87891 Personal history of nicotine dependence: Secondary | ICD-10-CM | POA: Insufficient documentation

## 2012-03-14 DIAGNOSIS — I10 Essential (primary) hypertension: Secondary | ICD-10-CM | POA: Insufficient documentation

## 2012-03-14 DIAGNOSIS — R0989 Other specified symptoms and signs involving the circulatory and respiratory systems: Secondary | ICD-10-CM | POA: Insufficient documentation

## 2012-03-14 MED ORDER — ADENOSINE (DIAGNOSTIC) 3 MG/ML IV SOLN
0.5600 mg/kg | Freq: Once | INTRAVENOUS | Status: AC
  Administered 2012-03-14: 29.1 mg via INTRAVENOUS

## 2012-03-14 MED ORDER — TECHNETIUM TC 99M SESTAMIBI GENERIC - CARDIOLITE
10.0000 | Freq: Once | INTRAVENOUS | Status: AC | PRN
  Administered 2012-03-14: 10 via INTRAVENOUS

## 2012-03-14 MED ORDER — TECHNETIUM TC 99M SESTAMIBI GENERIC - CARDIOLITE
30.0000 | Freq: Once | INTRAVENOUS | Status: AC | PRN
  Administered 2012-03-14: 30 via INTRAVENOUS

## 2012-03-14 NOTE — Progress Notes (Signed)
[facility] SITE 3 NUCLEAR MED [id number] [place] [address] [phone]  Cardiology Nuclear Med Study  Diane Richmond is a 66 y.o. female     MRN : [id number]     DOB: 1945/12/10  Procedure Date: 03/14/2012  Nuclear Med Background Indication for Stress Test:  Evaluation for Ischemia History:  Asthma and H/O Breast Ca (1994 Lumpectomy) Cardiac Risk Factors: Family History - CAD, History of Smoking, Hypertension, Lipids and NIDDM  Symptoms:  Chest Pressure, Dizziness, DOE and Fatigue   Nuclear Pre-Procedure Caffeine/Decaff Intake:  None > 12 hrs NPO After: 9:00pm   Lungs:  clear O2 Sat: 96% on room air. IV 0.9% NS with Angio Cath:  22g  IV Site: R Antecubital x 1, tolerated well IV Started by:  Irean Hong, RN  Chest Size (in):  36 Cup Size: B  Height: 5\' 2"  (1.575 m)  Weight:  114 lb (51.71 kg)  BMI:  Body mass index is 20.85 kg/(m^2). Tech Comments:  n/a    Nuclear Med Study 1 or 2 day study: 1 day  Stress Test Type:  Adenosine  Reading MD: Olga Millers, MD  Order Authorizing Provider:  Illene Regulus, MD  Resting Radionuclide: Technetium 56m Sestamibi  Resting Radionuclide Dose: 11.0 mCi   Stress Radionuclide:  Technetium 73m Sestamibi  Stress Radionuclide Dose: 33.0 mCi           Stress Protocol Rest HR: 70 Stress HR: 118  Rest BP: 155/80 Stress BP: 165/77  Exercise Time (min): n/a METS: n/a   Predicted Max HR: 155 bpm % Max HR: 68.39 bpm Rate Pressure Product: 62952   Dose of Adenosine (mg):  29.0 Dose of Lexiscan: n/a mg  Dose of Atropine (mg): n/a Dose of Dobutamine: n/a mcg/kg/min (at max HR)  Stress Test Technologist: Milana Na, EMT-P  Nuclear Technologist:  Domenic Polite, CNMT     Rest Procedure:  Myocardial perfusion imaging was performed at rest 45 minutes following the intravenous administration of Technetium 2m Sestamibi. Rest ECG: NSR-LBBB  Stress Procedure:  The patient received IV adenosine at  140 mcg/kg/min for 4 minutes.  There were non Dx without changes, + sob, and chest tightness with infusion.  Technetium 8m Sestamibi was injected at the 2 minute mark and quantitative spect images were obtained after a 45 minute delay. Stress ECG:  Uninteretable due to baseline LBBB  QPS Raw Data Images:  Acquisition technically good; normal left ventricular size. Stress Images:  Normal homogeneous uptake in all areas of the myocardium. Rest Images:  Normal homogeneous uptake in all areas of the myocardium. Subtraction (SDS):  No evidence of ischemia. Transient Ischemic Dilatation (Normal <1.22):  0.95 Lung/Heart Ratio (Normal <0.45):  0.25  Quantitative Gated Spect Images QGS EDV:  51 ml QGS ESV:  16 ml  Impression Exercise Capacity:  Adenosine study with no exercise. BP Response:  Normal blood pressure response. Clinical Symptoms:  There is dyspnea. ECG Impression:  Baseline:  LBBB.  EKG uninterpretable due to LBBB at rest and stress. Comparison with Prior Nuclear Study: No images to compare  Overall Impression:  Normal stress nuclear study.  LV Ejection Fraction: 69%.  LV Wall Motion:  NL LV Function; NL Wall Motion    Olga Millers

## 2012-04-02 ENCOUNTER — Ambulatory Visit (INDEPENDENT_AMBULATORY_CARE_PROVIDER_SITE_OTHER): Payer: Medicare Other | Admitting: Internal Medicine

## 2012-04-02 ENCOUNTER — Encounter: Payer: Self-pay | Admitting: Internal Medicine

## 2012-04-02 ENCOUNTER — Ambulatory Visit (INDEPENDENT_AMBULATORY_CARE_PROVIDER_SITE_OTHER)
Admission: RE | Admit: 2012-04-02 | Discharge: 2012-04-02 | Disposition: A | Discharge status: Home / Self Care | Payer: Medicare Other | Source: Ambulatory Visit | Attending: Internal Medicine | Admitting: Internal Medicine

## 2012-04-02 VITALS — BP 150/86 | HR 110 | Temp 100.4°F | Resp 16 | Ht 62.0 in | Wt 116.8 lb

## 2012-04-02 DIAGNOSIS — R059 Cough, unspecified: Secondary | ICD-10-CM

## 2012-04-02 DIAGNOSIS — J45901 Unspecified asthma with (acute) exacerbation: Secondary | ICD-10-CM

## 2012-04-02 DIAGNOSIS — R05 Cough: Secondary | ICD-10-CM

## 2012-04-02 DIAGNOSIS — J209 Acute bronchitis, unspecified: Secondary | ICD-10-CM

## 2012-04-02 MED ORDER — METHYLPREDNISOLONE ACETATE 80 MG/ML IJ SUSP
120.0000 mg | Freq: Once | INTRAMUSCULAR | Status: AC
  Administered 2012-04-02: 120 mg via INTRAMUSCULAR

## 2012-04-02 MED ORDER — HYDROCOD POLST-CPM POLST ER 10-8 MG PO CP12: 1.0000 | ORAL_CAPSULE | Freq: Two times a day (BID) | ORAL | Status: DC | PRN

## 2012-04-02 MED ORDER — LEVOFLOXACIN 500 MG PO TABS: 500.0000 mg | ORAL_TABLET | Freq: Every day | ORAL | Status: DC

## 2012-04-02 NOTE — Patient Instructions (Signed)

## 2012-04-03 ENCOUNTER — Encounter: Payer: Self-pay | Admitting: Internal Medicine

## 2012-04-03 NOTE — Progress Notes (Signed)
  Subjective:    Patient ID: Diane Richmond, female    DOB: 22-Jan-1946, 66 y.o.   MRN: [id number]  Cough This is a new problem. The current episode started in the past 7 days. The problem has been gradually worsening. The problem occurs every few hours. The cough is non-productive. Associated symptoms include chills, a fever, a sore throat and wheezing. Pertinent negatives include no chest pain, ear congestion, ear pain, headaches, heartburn, hemoptysis, myalgias, nasal congestion, postnasal drip, rash, rhinorrhea, shortness of breath, sweats or weight loss. She has tried a beta-agonist inhaler, OTC cough suppressant and steroid inhaler for the symptoms. The treatment provided mild relief. Her past medical history is significant for asthma. There is no history of pneumonia.      Review of Systems  Constitutional: Positive for fever and chills. Negative for weight loss, diaphoresis, activity change, appetite change, fatigue and unexpected weight change.  HENT: Positive for sore throat. Negative for ear pain, rhinorrhea and postnasal drip.   Eyes: Negative.   Respiratory: Positive for cough and wheezing. Negative for apnea, hemoptysis, choking, chest tightness, shortness of breath and stridor.   Cardiovascular: Negative for chest pain, palpitations and leg swelling.  Gastrointestinal: Negative for heartburn, nausea, vomiting, abdominal pain, diarrhea and constipation.  Genitourinary: Negative.   Musculoskeletal: Negative for myalgias, back pain, joint swelling, arthralgias and gait problem.  Skin: Negative for color change, pallor, rash and wound.  Neurological: Negative.  Negative for headaches.  Hematological: Negative for adenopathy. Does not bruise/bleed easily.  Psychiatric/Behavioral: Negative.        Objective:   Physical Exam  Vitals reviewed. Constitutional: She is oriented to person, place, and time. She appears well-developed and well-nourished.  Non-toxic appearance. She does not  have a sickly appearance. She does not appear ill. No distress.  HENT:  Head: Normocephalic and atraumatic.  Mouth/Throat: Oropharynx is clear and moist. No oropharyngeal exudate.  Eyes: Conjunctivae normal are normal. Right eye exhibits no discharge. Left eye exhibits no discharge. No scleral icterus.  Neck: Normal range of motion. Neck supple. No JVD present. No tracheal deviation present. No thyromegaly present.  Cardiovascular: Normal rate, regular rhythm, normal heart sounds and intact distal pulses.  Exam reveals no gallop and no friction rub.   No murmur heard. Pulmonary/Chest: Effort normal. No accessory muscle usage or stridor. Not tachypneic. No respiratory distress. She has no decreased breath sounds. She has wheezes in the right upper field and the left upper field. She has rhonchi in the right upper field and the left upper field. She has no rales. She exhibits no tenderness.  Abdominal: Soft. Bowel sounds are normal. She exhibits no distension and no mass. There is no tenderness. There is no rebound and no guarding.  Musculoskeletal: Normal range of motion. She exhibits no edema and no tenderness.  Lymphadenopathy:    She has no cervical adenopathy.  Neurological: She is oriented to person, place, and time.  Skin: Skin is warm and dry. No rash noted. She is not diaphoretic. No erythema. No pallor.  Psychiatric: She has a normal mood and affect. Her behavior is normal. Judgment and thought content normal.          Assessment & Plan:

## 2012-04-03 NOTE — Assessment & Plan Note (Signed)
She was treated with an injection of depo-medrol IM She will continue using all of her current inhalers

## 2012-04-03 NOTE — Assessment & Plan Note (Signed)
Start Levaquin for the infection and a cough suppressant

## 2012-04-03 NOTE — Assessment & Plan Note (Signed)
I will check her CXR today to see if she has pneumonia

## 2012-04-05 ENCOUNTER — Ambulatory Visit: Payer: Medicare Other | Admitting: Internal Medicine

## 2012-04-05 DIAGNOSIS — Z0289 Encounter for other administrative examinations: Secondary | ICD-10-CM

## 2012-04-06 ENCOUNTER — Telehealth: Payer: Self-pay | Admitting: *Deleted

## 2012-04-06 NOTE — Telephone Encounter (Signed)
Left msg on triage have ? About atorvastatin. Called daughter back she thought Dr. Yetta Barre had took mom off lipitor because it was making her sick. Per chart pt saw Dr. Kateri Mc 03/01/12 he gave rx for generic lipitor & for pt to come back in 4 weeks for labs. Daughter states mom actually hasn't been taking meds. Did not pick rx up for at[facility]vastain [facility] Daughter states she just started taking the welchol. Want mom to start taking meds like she suppose too. [facility]ing [facility] & she will make sure she start taking that will make appt to see md in 4 weeks to discuss labs...lmb

## 2012-04-13 ENCOUNTER — Encounter (INDEPENDENT_AMBULATORY_CARE_PROVIDER_SITE_OTHER): Payer: Medicare Other

## 2012-04-13 DIAGNOSIS — R0989 Other specified symptoms and signs involving the circulatory and respiratory systems: Secondary | ICD-10-CM

## 2012-04-20 ENCOUNTER — Encounter: Payer: Self-pay | Admitting: Internal Medicine

## 2012-06-12 ENCOUNTER — Other Ambulatory Visit: Payer: Self-pay | Admitting: Internal Medicine

## 2012-07-10 ENCOUNTER — Ambulatory Visit: Payer: Medicare Other | Admitting: Internal Medicine

## 2012-07-16 ENCOUNTER — Ambulatory Visit (INDEPENDENT_AMBULATORY_CARE_PROVIDER_SITE_OTHER): Payer: Medicare Other | Admitting: Internal Medicine

## 2012-07-16 ENCOUNTER — Encounter: Payer: Self-pay | Admitting: Internal Medicine

## 2012-07-16 VITALS — BP 112/74 | HR 88 | Temp 98.0°F | Resp 16 | Wt 117.8 lb

## 2012-07-16 DIAGNOSIS — E119 Type 2 diabetes mellitus without complications: Secondary | ICD-10-CM

## 2012-07-16 DIAGNOSIS — J309 Allergic rhinitis, unspecified: Secondary | ICD-10-CM

## 2012-07-16 DIAGNOSIS — I10 Essential (primary) hypertension: Secondary | ICD-10-CM

## 2012-07-16 DIAGNOSIS — J45909 Unspecified asthma, uncomplicated: Secondary | ICD-10-CM

## 2012-07-16 DIAGNOSIS — J209 Acute bronchitis, unspecified: Secondary | ICD-10-CM

## 2012-07-16 DIAGNOSIS — E785 Hyperlipidemia, unspecified: Secondary | ICD-10-CM

## 2012-07-16 MED ORDER — ATORVASTATIN CALCIUM 80 MG PO TABS: 80.0000 mg | ORAL_TABLET | Freq: Every day | ORAL | Status: DC

## 2012-07-16 MED ORDER — FLUTICASONE PROPIONATE 50 MCG/ACT NA SUSP: 2.0000 | Freq: Every day | NASAL | Status: DC

## 2012-07-16 NOTE — Patient Instructions (Addendum)
1. Allergy flare - it is the season. Plan Continue the allegra  Add Fluticasone nasal spray - 2 sprays to each nostril once a day. As your symptoms improve you may reduce to 1 spray per nostril once a day.  2. Cholesterol - was very high but ok to stop welchol if it was causing problems Plan Increase Lipitor (atorvastatin) to 80 mg once a day. You may take 2 x 40 mg once a day until current supply is gone.  Lab work any time after May 1  3. Diabetes - last A1C Nov '13 was good at 6%. Plan  continue present medication  Lab work any time after May 1.  4. Blood pressure -  BP Readings from Last 3 Encounters:  07/16/12 112/74  04/02/12 150/86  03/14/12 155/80   Good control today. Continue the present medications.

## 2012-07-16 NOTE — Progress Notes (Signed)
Subjective:    Patient ID: Diane Richmond, female    DOB: 01-Jul-1945, 67 y.o.   MRN: [id number]  HPI Diane Richmond presents today for a flare of asthma and allergy. She has not been having to use Albuterol more than twice a week. She also has a sore throat. No fever or chills. No respiratory distress. Having rhinorrhea, sneezing, watery eyes. She takes Careers adviser which helps a lot. She has used nasonex in the past with good results.   She c/o left neck,shoulder and arm pain since moving furniture over the weekend. No c/p, no chest pressure, no SOB.   Past Medical History  Diagnosis Date  . Hypertension   . Hyperlipidemia   . Diabetes mellitus     type II  . HX: breast cancer   . Asthma   . Bronchitis    Past Surgical History  Procedure Laterality Date  . Breast lumpectomy  1994    left  . Tonsillectomy    . Right port-a-cath  1994   Family History  Problem Relation Age of Onset  . Cancer Neg Hx   . Breast cancer    . Diabetes    . Hypertension    . Heart disease Mother     CAD/MI  . Aneurysm Father   . Stroke Sister   . Hypertension Sister    History   Social History  . Marital Status: Married    Spouse Name: N/A    Number of Children: 4  . Years of Education: 8   Occupational History  . nurse assistant    Social History Main Topics  . Smoking status: Never Smoker   . Smokeless tobacco: Former Neurosurgeon    Quit date: 03/02/1963  . Alcohol Use: No  . Drug Use: No  . Sexually Active: Not Currently   Other Topics Concern  . Not on file   Social History Narrative   8th grade. Married '67 - .  4 dtrs - '67, '70, '72, '78. Grandchildren - 4. 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 Lives with husband.     Current Outpatient Prescriptions on File Prior to Visit  Medication Sig Dispense Refill  . albuterol (PROVENTIL HFA;VENTOLIN HFA) 108 (90 BASE) MCG/ACT inhaler Inhale 2 puffs into the lungs every 6 (six) hours as needed for wheezing or shortness of breath.  1 Inhaler  0  .  amLODipine (NORVASC) 5 MG tablet Take 1 tablet (5 mg total) by mouth daily.  30 tablet  0  . aspirin 81 MG tablet Take 81 mg by mouth daily.        Marland Kitchen atorvastatin (LIPITOR) 40 MG tablet Take 1 tablet (40 mg total) by mouth daily.  30 tablet  11  . Blood Glucose Monitoring Suppl (ACCU-CHEK NANO SMARTVIEW) W/DEVICE KIT by Does not apply route.        . budesonide-formoterol (SYMBICORT) 160-4.5 MCG/ACT inhaler Inhale 2 puffs into the lungs 2 (two) times daily.  1 Inhaler  12  . fexofenadine (ALLEGRA) 180 MG tablet Take 1 tablet (180 mg total) by mouth daily.  90 tablet  3  . glucose blood test strip 1 each by Other route as needed for other. 1/day, and lancets 1/day 250.00  100 each  3  . Hydrocod Polst-Chlorphen Polst 10-8 MG CP12 Take 1 tablet by mouth 2 (two) times daily as needed.  8 each  0  . naproxen (NAPROSYN) 375 MG tablet Take 1 tablet (375 mg total) by mouth 2 (two) times daily.  20 tablet  0  . olmesartan (BENICAR) 20 MG tablet Take 1 tablet (20 mg total) by mouth daily.  90 tablet  3  . metFORMIN (GLUCOPHAGE) 500 MG tablet TAKE 1 TABLET (500 MG TOTAL) BY MOUTH 2 (TWO) TIMES DAILY.  60 tablet  6   No current facility-administered medications on file prior to visit.      Review of Systems     Objective:   Physical Exam        Assessment & Plan:

## 2012-07-17 NOTE — Assessment & Plan Note (Signed)
Allergy flare - it is the season. Plan Continue the allegra  Add Fluticasone nasal spray - 2 sprays to each nostril once a day. As your symptoms improve you may reduce to 1 spray per nostril once a day.

## 2012-07-17 NOTE — Assessment & Plan Note (Signed)
Stable on her present regimen. She DOES understand the difference between her maintenance medication, symbicort, and her rescue medication, albuterol.  Plan  Continue present regimen

## 2012-07-17 NOTE — Assessment & Plan Note (Signed)
Patient reports her symptoms have cleared.

## 2012-08-01 ENCOUNTER — Other Ambulatory Visit: Payer: Self-pay | Admitting: Internal Medicine

## 2012-08-29 ENCOUNTER — Other Ambulatory Visit: Payer: Self-pay

## 2012-08-29 MED ORDER — GLUCOSE BLOOD VI STRP: 1.0000 | ORAL_STRIP | Status: DC | PRN

## 2012-08-31 ENCOUNTER — Emergency Department (HOSPITAL_COMMUNITY)
Admission: EM | Admit: 2012-08-31 | Discharge: 2012-08-31 | Disposition: A | Discharge status: Home / Self Care | Payer: Medicare Other | Attending: Emergency Medicine | Admitting: Emergency Medicine

## 2012-08-31 ENCOUNTER — Emergency Department (HOSPITAL_COMMUNITY): Payer: Medicare Other

## 2012-08-31 DIAGNOSIS — E119 Type 2 diabetes mellitus without complications: Secondary | ICD-10-CM | POA: Insufficient documentation

## 2012-08-31 DIAGNOSIS — Z8709 Personal history of other diseases of the respiratory system: Secondary | ICD-10-CM | POA: Insufficient documentation

## 2012-08-31 DIAGNOSIS — H9209 Otalgia, unspecified ear: Secondary | ICD-10-CM | POA: Insufficient documentation

## 2012-08-31 DIAGNOSIS — I10 Essential (primary) hypertension: Secondary | ICD-10-CM | POA: Insufficient documentation

## 2012-08-31 DIAGNOSIS — Z79899 Other long term (current) drug therapy: Secondary | ICD-10-CM | POA: Insufficient documentation

## 2012-08-31 DIAGNOSIS — J45909 Unspecified asthma, uncomplicated: Secondary | ICD-10-CM | POA: Insufficient documentation

## 2012-08-31 DIAGNOSIS — R599 Enlarged lymph nodes, unspecified: Secondary | ICD-10-CM | POA: Insufficient documentation

## 2012-08-31 DIAGNOSIS — J039 Acute tonsillitis, unspecified: Secondary | ICD-10-CM | POA: Insufficient documentation

## 2012-08-31 DIAGNOSIS — J019 Acute sinusitis, unspecified: Secondary | ICD-10-CM | POA: Insufficient documentation

## 2012-08-31 DIAGNOSIS — J029 Acute pharyngitis, unspecified: Secondary | ICD-10-CM | POA: Insufficient documentation

## 2012-08-31 DIAGNOSIS — Z853 Personal history of malignant neoplasm of breast: Secondary | ICD-10-CM | POA: Insufficient documentation

## 2012-08-31 DIAGNOSIS — E785 Hyperlipidemia, unspecified: Secondary | ICD-10-CM | POA: Insufficient documentation

## 2012-08-31 DIAGNOSIS — J3489 Other specified disorders of nose and nasal sinuses: Secondary | ICD-10-CM | POA: Insufficient documentation

## 2012-08-31 DIAGNOSIS — Z7982 Long term (current) use of aspirin: Secondary | ICD-10-CM | POA: Insufficient documentation

## 2012-08-31 MED ORDER — AMOXICILLIN 875 MG PO TABS: 875.0000 mg | ORAL_TABLET | Freq: Two times a day (BID) | ORAL | Status: DC

## 2012-08-31 NOTE — ED Notes (Signed)
Pt verbalizes understanding 

## 2012-08-31 NOTE — ED Provider Notes (Signed)
History    This chart was scribed for Diane Sinning, PA working with Diane Munch, MD by ED Scribe, Burman Nieves. This patient was seen in room WTR5/WTR5 and the patient's care was started at 7:38 PM.   CSN: [id number]  Arrival date & time 08/31/12  4098   First MD Initiated Contact with Patient 08/31/12 1938      No chief complaint on file.   (Consider location/radiation/quality/duration/timing/severity/associated sxs/prior treatment) HPI HPI Comments: Diane Richmond is a 67 y.o. female who presents to the Emergency Department complaining of left sided swelling in her throat with associated left ear pain and nasal drainage which started this earlier this morning. Pt is complaining that the left side of her throat is swollen. She states she woke up from a nap today and noticed the swelling. She states the pain is mild in her throat. Pt denies any difficulty breathing/swallowing, fever, chills, cough, nausea, vomiting, diarrhea, SOB, weakness, and any other associated symptoms. Pt's PCP is Dr. Debby Bud. She has not taken anything for symptoms prior to arrival.    Past Medical History  Diagnosis Date  . Hypertension   . Hyperlipidemia   . Diabetes mellitus     type II  . HX: breast cancer   . Asthma   . Bronchitis     Past Surgical History  Procedure Laterality Date  . Breast lumpectomy  1994    left  . Tonsillectomy    . Right port-a-cath  1994    Family History  Problem Relation Age of Onset  . Cancer Neg Hx   . Breast cancer    . Diabetes    . Hypertension    . Heart disease Mother     CAD/MI  . Aneurysm Father   . Stroke Sister   . Hypertension Sister     History  Substance Use Topics  . Smoking status: Never Smoker   . Smokeless tobacco: Former Neurosurgeon    Quit date: 03/02/1963  . Alcohol Use: No    OB History   Grav Para Term Preterm Abortions TAB SAB Ect Mult Living                  Review of Systems  HENT: Positive for ear pain, sore throat and  postnasal drip.   All other systems reviewed and are negative.    Allergies  Atorvastatin and Promethazine-codeine  Home Medications   Current Outpatient Rx  Name  Route  Sig  Dispense  Refill  . albuterol (PROVENTIL HFA;VENTOLIN HFA) 108 (90 BASE) MCG/ACT inhaler   Inhalation   Inhale 2 puffs into the lungs every 6 (six) hours as needed for wheezing or shortness of breath.   1 Inhaler   0   . amLODipine (NORVASC) 5 MG tablet   Oral   Take 1 tablet (5 mg total) by mouth daily.   30 tablet   0   . aspirin 81 MG tablet   Oral   Take 81 mg by mouth daily.           Marland Kitchen atorvastatin (LIPITOR) 80 MG tablet   Oral   Take 1 tablet (80 mg total) by mouth daily.   30 tablet   11   . Blood Glucose Monitoring Suppl (ACCU-CHEK NANO SMARTVIEW) W/DEVICE KIT   Does not apply   by Does not apply route.           . fexofenadine (ALLEGRA) 180 MG tablet   Oral   Take 1  tablet (180 mg total) by mouth daily.   90 tablet   3   . fluticasone (FLONASE) 50 MCG/ACT nasal spray   Nasal   Place 2 sprays into the nose daily.   16 g   6   . glucose blood test strip   Other   1 each by Other route as needed for other. 1/day, and lancets 1/day 250.00   100 each   3   . Hydrocod Polst-Chlorphen Polst 10-8 MG CP12   Oral   Take 1 tablet by mouth 2 (two) times daily as needed.   8 each   0   . metFORMIN (GLUCOPHAGE) 500 MG tablet      TAKE 1 TABLET (500 MG TOTAL) BY MOUTH 2 (TWO) TIMES DAILY.   60 tablet   6   . naproxen (NAPROSYN) 375 MG tablet   Oral   Take 1 tablet (375 mg total) by mouth 2 (two) times daily.   20 tablet   0   . olmesartan (BENICAR) 20 MG tablet   Oral   Take 1 tablet (20 mg total) by mouth daily.   90 tablet   3   . SYMBICORT 160-4.5 MCG/ACT inhaler      INHALE 2 PUFFS INTO THE LUNGS 2 (TWO) TIMES DAILY.   10.2 g   1     BP 164/70  Pulse 73  Temp(Src) 98.1 F (36.7 C) (Oral)  Resp 15  SpO2 100%  Physical Exam  Nursing note and vitals  reviewed. Constitutional: She is oriented to person, place, and time. She appears well-developed and well-nourished.  HENT:  Head: Normocephalic and atraumatic. No trismus in the jaw.  Right Ear: Tympanic membrane, external ear and ear canal normal.  Left Ear: Tympanic membrane, external ear and ear canal normal.  Nose: Nose normal.  Mouth/Throat: Uvula is midline and mucous membranes are normal. No edematous. Posterior oropharyngeal erythema present. No oropharyngeal exudate, posterior oropharyngeal edema or tonsillar abscesses.  Normal voice phonation Patient handling secretions well.  Eyes: EOM are normal. Pupils are equal, round, and reactive to light.  Neck: Normal range of motion. Neck supple.  Cardiovascular: Normal rate, regular rhythm and normal heart sounds.   Pulmonary/Chest: Effort normal and breath sounds normal. She has no wheezes.  Musculoskeletal: Normal range of motion.  Lymphadenopathy:    She has cervical adenopathy.  Neurological: She is alert and oriented to person, place, and time.  Skin: Skin is warm and dry.  Psychiatric: She has a normal mood and affect. Her behavior is normal.    ED Course  Procedures (including critical care time) DIAGNOSTIC STUDIES: Oxygen Saturation is 100% on room air, normal by my interpretation.    COORDINATION OF CARE:  8:11 PM Discussed getting an x-ray of pt's neck.    Labs Reviewed - No data to display Dg Neck Soft Tissue  08/31/2012  *RADIOLOGY REPORT*  Clinical Data: Left side upper throat swelling anteriorly and laterally, oral swelling, began today  NECK SOFT TISSUES - 1+ VIEW  Comparison: None  Findings: Epiglottis and aryepiglottic folds normal thickness. Prevertebral soft tissues normal thickness. Question slightly prominent lingual tonsils. Osseous structures unremarkable. Airway patent.  IMPRESSION: Suspect mildly enlarged lingual tonsils. Otherwise negative exam.   Original Report Authenticated By: Ulyses Southward, M.D.       No diagnosis found.    MDM  Patient presenting with sore throat and swelling of the left side of her neck.  No difficulty swallowing or breathing.  Normal ROM  of the neck.  No trismus.  Patient is afebrile.  No signs of peritonsillar abscess.  Soft tissue neck xray showing enlargement of the lingual tonsils, but no other masses.   Patient stable for discharge.  Return precautions given.  I personally performed the services described in this documentation, which was scribed in my presence. The recorded information has been reviewed and is accurate.    Pascal Lux [place], PA-C 09/02/12 [phone]

## 2012-08-31 NOTE — ED Notes (Signed)
Pt present to Ed with c/o left ear and nasal draininage down to her throat.  Pt present with left side throat swelling.  Pt reports "I woke up from nap and noticed my neck was swollen.  Pt denies and pain.  Pt deneis n/v or chills/fever

## 2012-09-02 NOTE — ED Provider Notes (Signed)
  Medical screening examination/treatment/procedure(s) were performed by non-physician practitioner and as supervising physician I was immediately available for consultation/collaboration.    Keondra Haydu, MD 09/02/12 1555 

## 2012-09-11 ENCOUNTER — Other Ambulatory Visit: Payer: Self-pay | Admitting: Internal Medicine

## 2012-10-31 ENCOUNTER — Other Ambulatory Visit: Payer: Self-pay | Admitting: Internal Medicine

## 2013-01-23 ENCOUNTER — Ambulatory Visit (INDEPENDENT_AMBULATORY_CARE_PROVIDER_SITE_OTHER): Payer: Medicare Other | Admitting: Internal Medicine

## 2013-01-23 ENCOUNTER — Encounter: Payer: Self-pay | Admitting: Internal Medicine

## 2013-01-23 ENCOUNTER — Ambulatory Visit (INDEPENDENT_AMBULATORY_CARE_PROVIDER_SITE_OTHER)
Admission: RE | Admit: 2013-01-23 | Discharge: 2013-01-23 | Disposition: A | Discharge status: Home / Self Care | Payer: Medicare Other | Source: Ambulatory Visit | Attending: Internal Medicine | Admitting: Internal Medicine

## 2013-01-23 VITALS — BP 122/80 | HR 75 | Temp 97.2°F | Wt 117.0 lb

## 2013-01-23 DIAGNOSIS — R05 Cough: Secondary | ICD-10-CM

## 2013-01-23 DIAGNOSIS — R0609 Other forms of dyspnea: Secondary | ICD-10-CM

## 2013-01-23 DIAGNOSIS — I7090 Unspecified atherosclerosis: Secondary | ICD-10-CM

## 2013-01-23 DIAGNOSIS — I10 Essential (primary) hypertension: Secondary | ICD-10-CM

## 2013-01-23 MED ORDER — FUROSEMIDE 20 MG PO TABS: 20.0000 mg | ORAL_TABLET | Freq: Every day | ORAL | Status: DC

## 2013-01-23 NOTE — Patient Instructions (Addendum)
Cough and shortness of breath with exertion: no evidence of a bacterial infection, no evidence of bronchitis, no flare of COPD. With frothy white sputum and decreased exercise tolerance with an increased ejection fraction of 69% on the test Nov '133 the concern is diastolic (filling problems) dysfunction of the heart.  Plan Furosemide 20 mg once a day.  Chest x-ray today  2D echo to be scheduled at [facility] on [address]  Call Friday to let me know if the frothy sputum and shortness of breath has cleared up.

## 2013-01-23 NOTE — Progress Notes (Signed)
Subjective:    Patient ID: Diane Richmond, female    DOB: Jul 17, 1945, 67 y.o.   MRN: [id number]  HPI Diane Richmond presents for a week long history of cough productive of frothy white sputum, intermittent wheezing, DOE with SOB at 150'. She will get an indigestion type discomfort after meals. She has a myoview stress test in Nov '13 - no ischemia, EF 69%. No fever, no rigors. No NPD, weight has been stable. O2 sat 97%  Past Medical History  Diagnosis Date  . Hypertension   . Hyperlipidemia   . Diabetes mellitus     type II  . HX: breast cancer   . Asthma   . Bronchitis    Past Surgical History  Procedure Laterality Date  . Breast lumpectomy  1994    left  . Tonsillectomy    . Right port-a-cath  1994   Family History  Problem Relation Age of Onset  . Cancer Neg Hx   . Breast cancer    . Diabetes    . Hypertension    . Heart disease Mother     CAD/MI  . Aneurysm Father   . Stroke Sister   . Hypertension Sister    History   Social History  . Marital Status: Married    Spouse Name: N/A    Number of Children: 4  . Years of Education: 8   Occupational History  . nurse assistant    Social History Main Topics  . Smoking status: Never Smoker   . Smokeless tobacco: Former Neurosurgeon    Quit date: 03/02/1963  . Alcohol Use: No  . Drug Use: No  . Sexual Activity: Not Currently   Other Topics Concern  . Not on file   Social History Narrative   8th grade. Married '67 - .  4 dtrs - '67, '70, '72, '78. Grandchildren - 4. 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 Lives with husband.      Current Outpatient Prescriptions on File Prior to Visit  Medication Sig Dispense Refill  . albuterol (PROVENTIL HFA;VENTOLIN HFA) 108 (90 BASE) MCG/ACT inhaler Inhale 2 puffs into the lungs every 6 (six) hours as needed for wheezing or shortness of breath.  1 Inhaler  0  . amLODipine (NORVASC) 5 MG tablet TAKE 1 TABLET BY MOUTH EVERY DAY  90 tablet  2  . amoxicillin (AMOXIL) 875 MG tablet Take 1 tablet (875  mg total) by mouth 2 (two) times daily.  20 tablet  0  . aspirin 81 MG tablet Take 81 mg by mouth daily.        . budesonide-formoterol (SYMBICORT) 160-4.5 MCG/ACT inhaler Inhale 2 puffs into the lungs 2 (two) times daily.      . fluticasone (FLONASE) 50 MCG/ACT nasal spray Place 2 sprays into the nose daily.  16 g  6  . metFORMIN (GLUCOPHAGE) 500 MG tablet Take 500 mg by mouth 2 (two) times daily with a meal.      . SYMBICORT 160-4.5 MCG/ACT inhaler INHALE 2 PUFFS INTO THE LUNGS 2 (TWO) TIMES DAILY.  10.2 g  11   No current facility-administered medications on file prior to visit.      Review of Systems System review is negative for any constitutional, cardiac, pulmonary, GI or neuro symptoms or complaints other than as described in the HPI.     Objective:   Physical Exam Filed Vitals:   01/23/13 1522  BP: 122/80  Pulse: 75  Temp: 97.2 F (36.2 C)   Gen'l-  slender AA woman in no distress HEENT- normal  Neck - no thyromegaly, 4-5 cm JVD at 40 degrees Pulm - good breath sounds, no rales, feint end - respiratory wheezing Cor - 2+ radial pulse, RRR Ext - no edema      Assessment & Plan:

## 2013-01-24 NOTE — Assessment & Plan Note (Signed)
BP Readings from Last 3 Encounters:  01/23/13 122/80  08/31/12 159/73  07/16/12 112/74   Good control of BP  Plan Continue present medications
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
## 2013-02-08 ENCOUNTER — Other Ambulatory Visit (HOSPITAL_COMMUNITY): Payer: Medicare Other

## 2013-02-18 ENCOUNTER — Other Ambulatory Visit: Payer: Self-pay | Admitting: Internal Medicine

## 2013-02-22 ENCOUNTER — Ambulatory Visit (HOSPITAL_COMMUNITY): Payer: Medicare Other | Attending: Internal Medicine

## 2013-02-22 DIAGNOSIS — I079 Rheumatic tricuspid valve disease, unspecified: Secondary | ICD-10-CM | POA: Insufficient documentation

## 2013-02-22 DIAGNOSIS — I379 Nonrheumatic pulmonary valve disorder, unspecified: Secondary | ICD-10-CM | POA: Insufficient documentation

## 2013-02-22 DIAGNOSIS — E785 Hyperlipidemia, unspecified: Secondary | ICD-10-CM | POA: Insufficient documentation

## 2013-02-22 DIAGNOSIS — I10 Essential (primary) hypertension: Secondary | ICD-10-CM | POA: Insufficient documentation

## 2013-02-22 DIAGNOSIS — Z9010 Acquired absence of unspecified breast and nipple: Secondary | ICD-10-CM | POA: Insufficient documentation

## 2013-02-22 DIAGNOSIS — R059 Cough, unspecified: Secondary | ICD-10-CM

## 2013-02-22 DIAGNOSIS — R0989 Other specified symptoms and signs involving the circulatory and respiratory systems: Secondary | ICD-10-CM | POA: Insufficient documentation

## 2013-02-22 DIAGNOSIS — E119 Type 2 diabetes mellitus without complications: Secondary | ICD-10-CM | POA: Insufficient documentation

## 2013-02-22 DIAGNOSIS — Z853 Personal history of malignant neoplasm of breast: Secondary | ICD-10-CM | POA: Insufficient documentation

## 2013-02-22 DIAGNOSIS — R0609 Other forms of dyspnea: Secondary | ICD-10-CM

## 2013-02-22 DIAGNOSIS — R05 Cough: Secondary | ICD-10-CM

## 2013-02-22 DIAGNOSIS — R0602 Shortness of breath: Secondary | ICD-10-CM

## 2013-02-22 NOTE — Progress Notes (Signed)
Echocardiogram performed.  

## 2013-02-25 ENCOUNTER — Encounter: Payer: Self-pay | Admitting: Internal Medicine

## 2013-04-26 ENCOUNTER — Encounter: Payer: Self-pay | Admitting: Internal Medicine

## 2013-04-26 ENCOUNTER — Ambulatory Visit (INDEPENDENT_AMBULATORY_CARE_PROVIDER_SITE_OTHER)
Admission: RE | Admit: 2013-04-26 | Discharge: 2013-04-26 | Disposition: A | Discharge status: Home / Self Care | Payer: Medicare Other | Source: Ambulatory Visit | Attending: Internal Medicine | Admitting: Internal Medicine

## 2013-04-26 ENCOUNTER — Ambulatory Visit (INDEPENDENT_AMBULATORY_CARE_PROVIDER_SITE_OTHER): Payer: Medicare Other | Admitting: Internal Medicine

## 2013-04-26 ENCOUNTER — Other Ambulatory Visit (INDEPENDENT_AMBULATORY_CARE_PROVIDER_SITE_OTHER): Payer: Medicare Other

## 2013-04-26 VITALS — BP 126/80 | HR 78 | Temp 97.5°F | Wt 115.8 lb

## 2013-04-26 DIAGNOSIS — R05 Cough: Secondary | ICD-10-CM

## 2013-04-26 DIAGNOSIS — E119 Type 2 diabetes mellitus without complications: Secondary | ICD-10-CM

## 2013-04-26 DIAGNOSIS — R059 Cough, unspecified: Secondary | ICD-10-CM

## 2013-04-26 DIAGNOSIS — E785 Hyperlipidemia, unspecified: Secondary | ICD-10-CM

## 2013-04-26 LAB — CBC WITH DIFFERENTIAL/PLATELET
BASOS ABS: 0.1 10*3/uL (ref 0.0–0.1)
Basophils Relative: 0.6 % (ref 0.0–3.0)
EOS ABS: 1 10*3/uL — AB (ref 0.0–0.7)
Eosinophils Relative: 10.2 % — ABNORMAL HIGH (ref 0.0–5.0)
HEMATOCRIT: 39.7 % (ref 36.0–46.0)
Hemoglobin: 13.3 g/dL (ref 12.0–15.0)
Lymphocytes Relative: 27.1 % (ref 12.0–46.0)
Lymphs Abs: 2.7 10*3/uL (ref 0.7–4.0)
MCHC: 33.4 g/dL (ref 30.0–36.0)
MCV: 89.5 fl (ref 78.0–100.0)
Monocytes Absolute: 0.9 10*3/uL (ref 0.1–1.0)
Monocytes Relative: 9 % (ref 3.0–12.0)
NEUTROS ABS: 5.2 10*3/uL (ref 1.4–7.7)
Neutrophils Relative %: 53.1 % (ref 43.0–77.0)
Platelets: 302 10*3/uL (ref 150.0–400.0)
RBC: 4.44 Mil/uL (ref 3.87–5.11)
RDW: 13.2 % (ref 11.5–14.6)
WBC: 9.9 10*3/uL (ref 4.5–10.5)

## 2013-04-26 LAB — HEMOGLOBIN A1C: Hgb A1c MFr Bld: 6.3 % (ref 4.6–6.5)

## 2013-04-26 MED ORDER — ROSUVASTATIN CALCIUM 20 MG PO TABS: 20.0000 mg | ORAL_TABLET | Freq: Every day | ORAL | Status: DC
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
## 2013-04-26 NOTE — Progress Notes (Signed)
Pre visit review using our clinic review tool, if applicable. No additional management support is needed unless otherwise documented below in the visit note. 

## 2013-04-26 NOTE — Patient Instructions (Signed)
Cough ad sore throat - on exam this appears to be a viral infection. But, there abnormal breath sounds on exam. Plan Complete blood count to rule out bacterial infection  Chest x-ray to rule out pneumonia  Diabetes - you have been well controlled with last A1C in Nov '13 being 6% Plan Will check lab today  Cholesterol - very high when last checked. You had been on lipitor which was stopped by ED due to leg pain Plan Start Crestor 20 mg once a day  Follow up lab in 4 weeks.

## 2013-04-27 ENCOUNTER — Other Ambulatory Visit: Payer: Self-pay | Admitting: Internal Medicine

## 2013-04-27 ENCOUNTER — Encounter: Payer: Self-pay | Admitting: Internal Medicine

## 2013-04-27 DIAGNOSIS — I10 Essential (primary) hypertension: Secondary | ICD-10-CM

## 2013-04-27 DIAGNOSIS — R911 Solitary pulmonary nodule: Secondary | ICD-10-CM

## 2013-04-28 NOTE — Assessment & Plan Note (Signed)
Last LDL off medication was 246!!! Lipitor had been stopped due to possible leg pain.  Plan Start Crestor 20 mg daily   Lab in 4 weeks

## 2013-04-28 NOTE — Assessment & Plan Note (Signed)
Lab Results  Component Value Date   HGBA1C 6.3 04/26/2013   Good control on present regimen

## 2013-04-29 ENCOUNTER — Other Ambulatory Visit: Payer: Self-pay | Admitting: Internal Medicine

## 2013-04-30 ENCOUNTER — Other Ambulatory Visit: Payer: Self-pay | Admitting: Internal Medicine

## 2013-04-30 ENCOUNTER — Telehealth: Payer: Self-pay

## 2013-04-30 ENCOUNTER — Other Ambulatory Visit: Payer: Medicare Other

## 2013-04-30 DIAGNOSIS — I10 Essential (primary) hypertension: Secondary | ICD-10-CM

## 2013-04-30 LAB — BASIC METABOLIC PANEL
BUN: 17 mg/dL (ref 6–23)
CO2: 29 mEq/L (ref 19–32)
CREATININE: 0.8 mg/dL (ref 0.4–1.2)
Calcium: 9.8 mg/dL (ref 8.4–10.5)
Chloride: 101 mEq/L (ref 96–112)
GFR: 90.69 mL/min (ref >60.00)
GLUCOSE: 93 mg/dL (ref 70–99)
POTASSIUM: 3.7 meq/L (ref 3.5–5.1)
Sodium: 138 mEq/L (ref 135–145)

## 2013-04-30 NOTE — Telephone Encounter (Signed)
I called patient and left a message to let her know she is to have lab work done before her CT

## 2013-05-01 ENCOUNTER — Ambulatory Visit (INDEPENDENT_AMBULATORY_CARE_PROVIDER_SITE_OTHER)
Admission: RE | Admit: 2013-05-01 | Discharge: 2013-05-01 | Disposition: A | Discharge status: Home / Self Care | Payer: Medicare Other | Source: Ambulatory Visit | Attending: Internal Medicine | Admitting: Internal Medicine

## 2013-05-01 DIAGNOSIS — R911 Solitary pulmonary nodule: Secondary | ICD-10-CM

## 2013-05-01 MED ORDER — IOHEXOL 300 MG/ML  SOLN
80.0000 mL | Freq: Once | INTRAMUSCULAR | Status: AC | PRN
  Administered 2013-05-01: 80 mL via INTRAVENOUS

## 2013-05-06 ENCOUNTER — Ambulatory Visit (INDEPENDENT_AMBULATORY_CARE_PROVIDER_SITE_OTHER): Payer: Medicare Other | Admitting: Internal Medicine

## 2013-05-06 ENCOUNTER — Encounter: Payer: Self-pay | Admitting: Internal Medicine

## 2013-05-06 VITALS — BP 158/86 | HR 73 | Temp 97.0°F | Wt 117.0 lb

## 2013-05-06 DIAGNOSIS — N63 Unspecified lump in unspecified breast: Secondary | ICD-10-CM

## 2013-05-06 DIAGNOSIS — R9389 Abnormal findings on diagnostic imaging of other specified body structures: Secondary | ICD-10-CM

## 2013-05-06 DIAGNOSIS — R928 Other abnormal and inconclusive findings on diagnostic imaging of breast: Secondary | ICD-10-CM

## 2013-05-06 DIAGNOSIS — R911 Solitary pulmonary nodule: Secondary | ICD-10-CM

## 2013-05-06 NOTE — Progress Notes (Signed)
Pre visit review using our clinic review tool, if applicable. No additional management support is needed unless otherwise documented below in the visit note. 

## 2013-05-06 NOTE — Patient Instructions (Signed)
Thanks for coming in.  The CT chest - revealed a nodule in the right upper lobe of the lung which is likely to be inflammatory. Plan F/u CT chest on Friday April 3rd - you will get a confirmation call.  The CT chest revealed a 0.9 cm nodule in the left breast Plan [facility].  The CT chest revealed a possible structual  Change in the pancrease - when the two issue above are resolved we can consider a follow up MRI of the pancreas.

## 2013-05-07 NOTE — Progress Notes (Signed)
   Subjective:    Patient ID: Rolly SalterBetty Pottenger, female    DOB: [birth year], 68 y.o.   MRN: [id number]  HPI Mrs. Thamas JaegersLennon and her daughter present to discuss recent CT chest. Mrs. Thamas JaegersLennon had presented with URI and had a chest x-ray. This revealed a RUL nodule leading to the CT scan. There were multiple findings: confirmed nodule in RUL likely to be inflammatory; borderline enlarge nodes hilar and mediastinum - likely to be reactive; 0.9 cm nodule left breast; abnormal structure tail of pancrease - not conclusive.  PMH, FamHx and SocHx reviewed for any changes and relevance. Current Outpatient Prescriptions on File Prior to Visit  Medication Sig Dispense Refill  . albuterol (PROVENTIL HFA;VENTOLIN HFA) 108 (90 BASE) MCG/ACT inhaler Inhale 2 puffs into the lungs every 6 (six) hours as needed for wheezing or shortness of breath.  1 Inhaler  0  . amLODipine (NORVASC) 5 MG tablet TAKE 1 TABLET BY MOUTH EVERY DAY  90 tablet  2  . amoxicillin (AMOXIL) 875 MG tablet Take 1 tablet (875 mg total) by mouth 2 (two) times daily.  20 tablet  0  . aspirin 81 MG tablet Take 81 mg by mouth daily.        . budesonide-formoterol (SYMBICORT) 160-4.5 MCG/ACT inhaler Inhale 2 puffs into the lungs 2 (two) times daily.      . fluticasone (FLONASE) 50 MCG/ACT nasal spray Place 2 sprays into the nose daily.  16 g  6  . furosemide (LASIX) 20 MG tablet Take 1 tablet (20 mg total) by mouth daily.  30 tablet  3  . metFORMIN (GLUCOPHAGE) 500 MG tablet TAKE 1 TABLET BY MOUTH TWICE A DAY  60 tablet  6  . rosuvastatin (CRESTOR) 20 MG tablet Take 1 tablet (20 mg total) by mouth daily.  30 tablet  3  . SYMBICORT 160-4.5 MCG/ACT inhaler INHALE 2 PUFFS INTO THE LUNGS 2 (TWO) TIMES DAILY.  10.2 g  11   No current facility-administered medications on file prior to visit.      Review of Systems System review is negative for any constitutional, cardiac, pulmonary, GI or neuro symptoms or complaints other than as described in the HPI.    Objective:   Physical Exam Filed Vitals:   05/06/13 1512  BP: 158/86  Pulse: 73  Temp: 97 F (36.1 C)   Wt Readings from Last 3 Encounters:  05/06/13 117 lb (53.071 kg)  04/26/13 115 lb 12.8 oz (52.527 kg)  01/23/13 117 lb (53.071 kg)   Gen'l- WNWD slender woman in no distress  Neuro - alert and oriented.       Assessment & Plan:  Abnormal radiology report: 1. Repeat CT chest w/o contrast  in 3 months to follow possible inflammatory nodule RUL and lymph nodes 2. Diagnostic bilateral mammograms in patient w/ h/o breast cancer now with 0.9 cm nodule left breast as incidental finding chest CT 3. If #s 1 & 2 are ok she may need to have MRI pancrease. She is asymptomatic: no weight loss, night sweats, digestive changes or abdominal pain.   (greater than 50% of 30 min visit spent on education and counseling, reviewing radiology report and viewing CT images and planning further evaluation)

## 2013-05-14 ENCOUNTER — Other Ambulatory Visit: Payer: Self-pay | Admitting: Internal Medicine

## 2013-05-14 DIAGNOSIS — N63 Unspecified lump in unspecified breast: Secondary | ICD-10-CM

## 2013-05-20 ENCOUNTER — Ambulatory Visit
Admission: RE | Admit: 2013-05-20 | Discharge: 2013-05-20 | Disposition: A | Discharge status: Home / Self Care | Payer: Medicare Other | Source: Ambulatory Visit | Attending: Internal Medicine | Admitting: Internal Medicine

## 2013-05-20 DIAGNOSIS — N63 Unspecified lump in unspecified breast: Secondary | ICD-10-CM

## 2013-07-26 ENCOUNTER — Other Ambulatory Visit: Payer: Medicare Other

## 2013-08-12 ENCOUNTER — Other Ambulatory Visit: Payer: Self-pay

## 2013-08-12 MED ORDER — AMLODIPINE BESYLATE 5 MG PO TABS: ORAL_TABLET | ORAL | Status: DC

## 2013-09-26 ENCOUNTER — Other Ambulatory Visit: Payer: Self-pay

## 2013-09-26 MED ORDER — ROSUVASTATIN CALCIUM 20 MG PO TABS: 20.0000 mg | ORAL_TABLET | Freq: Every day | ORAL | Status: DC

## 2013-10-13 ENCOUNTER — Other Ambulatory Visit: Payer: Self-pay | Admitting: Endocrinology

## 2013-10-17 ENCOUNTER — Ambulatory Visit (INDEPENDENT_AMBULATORY_CARE_PROVIDER_SITE_OTHER): Payer: Medicare Other | Admitting: Family Medicine

## 2013-10-17 ENCOUNTER — Encounter: Payer: Self-pay | Admitting: Family Medicine

## 2013-10-17 VITALS — BP 152/80 | HR 72 | Temp 98.3°F | Ht 63.0 in | Wt 119.4 lb

## 2013-10-17 DIAGNOSIS — E785 Hyperlipidemia, unspecified: Secondary | ICD-10-CM

## 2013-10-17 DIAGNOSIS — I10 Essential (primary) hypertension: Secondary | ICD-10-CM

## 2013-10-17 DIAGNOSIS — E1159 Type 2 diabetes mellitus with other circulatory complications: Secondary | ICD-10-CM

## 2013-10-17 MED ORDER — ROSUVASTATIN CALCIUM 20 MG PO TABS: 20.0000 mg | ORAL_TABLET | Freq: Every day | ORAL | Status: DC

## 2013-10-17 MED ORDER — FLUTICASONE PROPIONATE 50 MCG/ACT NA SUSP: 2.0000 | Freq: Every day | NASAL | Status: DC

## 2013-10-17 MED ORDER — METFORMIN HCL 500 MG PO TABS: ORAL_TABLET | ORAL | Status: DC

## 2013-10-17 MED ORDER — AMLODIPINE BESYLATE 5 MG PO TABS: ORAL_TABLET | ORAL | Status: DC

## 2013-10-17 MED ORDER — GLUCOSE BLOOD VI STRP: ORAL_STRIP | Status: DC

## 2013-10-17 NOTE — Patient Instructions (Signed)
Diabetes and Standards of Medical Care  Diabetes is complicated. You may find that your diabetes team includes a dietitian, nurse, diabetes educator, eye doctor, and more. To help everyone know what is going on and to help you get the care you deserve, the following schedule of care was developed to help keep you on track. Below are the tests, exams, vaccines, medicines, education, and plans you will need. HbA1c test This test shows how well you have controlled your glucose over the past 2-3 months. It is used to see if your diabetes management plan needs to be adjusted.   It is performed at least 2 times a year if you are meeting treatment goals.  It is performed 4 times a year if therapy has changed or if you are not meeting treatment goals. Blood pressure test  This test is performed at every routine medical visit. The goal is less than 140/90 mmHg for most people, but 130/80 mmHg in some cases. Ask your health care Diane Richmond about your goal. Dental exam  Follow up with the dentist regularly. Eye exam  If you are diagnosed with type 1 diabetes as a child, get an exam upon reaching the age of 10 years or older and have had diabetes for 3-5 years. Yearly eye exams are recommended after that initial eye exam.  If you are diagnosed with type 1 diabetes as an adult, get an exam within 5 years of diagnosis and then yearly.  If you are diagnosed with type 2 diabetes, get an exam as soon as possible after the diagnosis and then yearly. Foot care exam  Visual foot exams are performed at every routine medical visit. The exams check for cuts, injuries, or other problems with the feet.  A comprehensive foot exam should be done yearly. This includes visual inspection as well as assessing foot pulses and testing for loss of sensation.  Check your feet nightly for cuts, injuries, or other problems with your feet. Tell your health care Katyana Trolinger if anything is not healing. Kidney function test (urine  microalbumin)  This test is performed once a year.  Type 1 diabetes: The first test is performed 5 years after diagnosis.  Type 2 diabetes: The first test is performed at the time of diagnosis.  A serum creatinine and estimated glomerular filtration rate (eGFR) test is done once a year to assess the level of chronic kidney disease (CKD), if present. Lipid profile (cholesterol, HDL, LDL, triglycerides)  Performed every 5 years for most people.  The goal for LDL is less than 100 mg/dL. If you are at high risk, the goal is less than 70 mg/dL.  The goal for HDL is 40 mg/dL-50 mg/dL for men and 50 mg/dL-60 mg/dL for women. An HDL cholesterol of 60 mg/dL or higher gives some protection against heart disease.  The goal for triglycerides is less than 150 mg/dL. Influenza vaccine, pneumococcal vaccine, and hepatitis B vaccine  The influenza vaccine is recommended yearly.  The pneumococcal vaccine is generally given once in a lifetime. However, there are some instances when another vaccination is recommended. Check with your health care Diangelo Radel.  The hepatitis B vaccine is also recommended for adults with diabetes. Diabetes self-management education  Education is recommended at diagnosis and ongoing as needed. Treatment plan  Your treatment plan is reviewed at every medical visit. Document Released: 02/06/2009 Document Revised: 12/12/2012 Document Reviewed: 09/11/2012 ExitCare Patient Information 2015 [employer]. This information is not intended to replace advice given to you by your   health care Tyniah Kastens. Make sure you discuss any questions you have with your health care Cherokee Boccio.

## 2013-10-17 NOTE — Progress Notes (Signed)
   Subjective:    Patient ID: Diane Richmond, female    DOB: 1946/01/27, 68 y.o.   MRN: [id number]  HPI  HPI HYPERTENSION  Blood pressure range-not checking   Chest pain- no      Dyspnea- no Lightheadedness- no   Edema- no Other side effects - no   Medication compliance: good Low salt diet- yes  DIABETES  Blood Sugar ranges -- 70s-98  Polyuria- no New Visual problems- no Hypoglycemic symptoms- no Other side effects-no Medication compliance - good Last eye exam- 09/2012 Foot exam- today  HYPERLIPIDEMIA  Medication compliance- good RUQ pain- no  Muscle aches- no Other side effects-no   Review of Systems  Constitutional: Negative for diaphoresis, appetite change, fatigue and unexpected weight change.  Eyes: Negative for pain, redness and visual disturbance.  Respiratory: Negative for cough, chest tightness, shortness of breath and wheezing.   Cardiovascular: Negative for chest pain, palpitations and leg swelling.  Endocrine: Negative for cold intolerance, heat intolerance, polydipsia, polyphagia and polyuria.  Genitourinary: Negative for dysuria, frequency and difficulty urinating.  Neurological: Negative for dizziness, light-headedness, numbness and headaches.      Objective:   Physical Exam  BP 152/80  Pulse 72  Temp(Src) 98.3 F (36.8 C) (Oral)  Ht 5\' 3"  (1.6 m)  Wt 119 lb 6.4 oz (54.159 kg)  BMI 21.16 kg/m2  SpO2 97% General appearance: alert, cooperative, appears stated age and no distress Neck: no adenopathy, supple, symmetrical, trachea midline and thyroid not enlarged, symmetric, no tenderness/mass/nodules Lungs: clear to auscultation bilaterally Heart: S1, S2 normal Extremities: extremities normal, atraumatic, no cyanosis or edema Psych--no depression, no anxiety      Assessment & Plan:  1. Type II or unspecified type diabetes mellitus with peripheral circulatory disorders, uncontrolled(250.72) Check labs con't meds  2. Essential  hypertension Stable con't meds  3. Other and unspecified hyperlipidemia Check labs con't meds

## 2013-10-17 NOTE — Progress Notes (Signed)
Pre visit review using our clinic review tool, if applicable. No additional management support is needed unless otherwise documented below in the visit note. 

## 2013-10-18 ENCOUNTER — Telehealth: Payer: Self-pay | Admitting: Internal Medicine

## 2013-10-18 NOTE — Telephone Encounter (Signed)
Relevant patient education assigned to patient using Emmi. ° °

## 2013-10-29 ENCOUNTER — Telehealth: Payer: Self-pay | Admitting: *Deleted

## 2013-10-29 DIAGNOSIS — E119 Type 2 diabetes mellitus without complications: Secondary | ICD-10-CM

## 2013-10-29 NOTE — Telephone Encounter (Signed)
Patient has an appointment. Lipid, a1c, bmet ordered Diabetic bundle 

## 2013-12-19 ENCOUNTER — Ambulatory Visit: Payer: Medicare Other | Admitting: Family Medicine

## 2013-12-19 DIAGNOSIS — Z0289 Encounter for other administrative examinations: Secondary | ICD-10-CM

## 2014-02-20 ENCOUNTER — Ambulatory Visit (INDEPENDENT_AMBULATORY_CARE_PROVIDER_SITE_OTHER): Payer: Medicare Other | Admitting: Family

## 2014-02-20 ENCOUNTER — Encounter: Payer: Self-pay | Admitting: Family

## 2014-02-20 VITALS — BP 168/78 | HR 80 | Temp 98.0°F | Resp 18 | Ht 62.0 in | Wt 123.4 lb

## 2014-02-20 DIAGNOSIS — E785 Hyperlipidemia, unspecified: Secondary | ICD-10-CM

## 2014-02-20 DIAGNOSIS — E119 Type 2 diabetes mellitus without complications: Secondary | ICD-10-CM

## 2014-02-20 DIAGNOSIS — I10 Essential (primary) hypertension: Secondary | ICD-10-CM

## 2014-02-20 NOTE — Assessment & Plan Note (Signed)
Remains uncontrolled based on previous lipid panel. Obtain new lipid panel and hepatic panel when fasting. Continue current Crestor, consider adding medication if new lipid panel remains high.

## 2014-02-20 NOTE — Progress Notes (Signed)
Subjective:    Patient ID: Diane Richmond, female    DOB: [birth year], 68 y.o.   MRN: [id number]  Chief Complaint  Patient presents with  . Establish Care    would like a sugar check and cholesterol check    HPI:  Diane Richmond is a 68 y.o. female who presents today for diabetes    1) Diabetes - Currently taking metformin; morning sugars at home have been ranging 90-110's. Due for a diabetic exam. Last foot exam was in June. Describes occasional tingling in both feet.  Declined flu shot.   Lab Results  Component Value Date   HGBA1C 6.3 04/26/2013   2) Cholesterol - current taking Crestor. Denies any adverse side effects.  Lipid Panel     Component Value Date/Time   CHOL 315* 03/01/2012 1159   TRIG 123.0 03/01/2012 1159   HDL 27.00* 03/01/2012 1159   CHOLHDL 12 03/01/2012 1159   VLDL 24.6 03/01/2012 1159   LDLDIRECT 245.9 03/01/2012 1159   3) Hypertension - currently taking amlodipine. Denies any edema in lower extremities. Denies chest pain/discomfort, palpitations or edema.   BP Readings from Last 3 Encounters:  02/20/14 168/78  10/17/13 152/80  05/06/13 158/86   Allergies  Allergen Reactions  . Atorvastatin     REACTION: legs ache  . Promethazine-Codeine [Promethazine-Codeine] Nausea And Vomiting    Pt states cough syrup she can't take   Current Outpatient Prescriptions on File Prior to Visit  Medication Sig Dispense Refill  . albuterol (PROVENTIL HFA;VENTOLIN HFA) 108 (90 BASE) MCG/ACT inhaler Inhale 2 puffs into the lungs every 6 (six) hours as needed for wheezing or shortness of breath.  1 Inhaler  0  . amLODipine (NORVASC) 5 MG tablet TAKE 1 TABLET BY MOUTH EVERY DAY  90 tablet  2  . aspirin 81 MG tablet Take 81 mg by mouth daily.        . fluticasone (FLONASE) 50 MCG/ACT nasal spray Place 2 sprays into both nostrils daily.  16 g  6  . furosemide (LASIX) 20 MG tablet Take 1 tablet (20 mg total) by mouth daily.  30 tablet  3  . glucose blood test strip  Accu-chek Smart View Test strips check blood sugar daily  100 each  12  . metFORMIN (GLUCOPHAGE) 500 MG tablet TAKE 1 TABLET BY MOUTH TWICE A DAY  60 tablet  6  . rosuvastatin (CRESTOR) 20 MG tablet Take 1 tablet (20 mg total) by mouth daily.  90 tablet  0  . SYMBICORT 160-4.5 MCG/ACT inhaler INHALE 2 PUFFS INTO THE LUNGS 2 (TWO) TIMES DAILY.  10.2 g  11   No current facility-administered medications on file prior to visit.   Past Medical History  Diagnosis Date  . Hypertension   . Hyperlipidemia   . Diabetes mellitus     type II  . HX: breast cancer   . Asthma   . Bronchitis     Review of Systems    See HPI  Objective:    BP 168/78  Pulse 80  Temp(Src) 98 F (36.7 C) (Oral)  Resp 18  Ht 5\' 2"  (1.575 m)  Wt 123 lb 6.4 oz (55.974 kg)  BMI 22.56 kg/m2  SpO2 97% Nursing note and vital signs reviewed.  Physical Exam  Constitutional: She is oriented to person, place, and time. She appears well-developed and well-nourished. No distress.  Cardiovascular: Normal rate, regular rhythm and normal heart sounds.   Pulmonary/Chest: Effort normal and breath sounds normal.  Neurological: She is alert and oriented to person, place, and time.  Skin: Skin is warm and dry.  Psychiatric: She has a normal mood and affect. Her behavior is normal. Judgment and thought content normal.       Assessment & Plan:

## 2014-02-20 NOTE — Progress Notes (Signed)
Pre visit review using our clinic review tool, if applicable. No additional management support is needed unless otherwise documented below in the visit note. 

## 2014-02-20 NOTE — Assessment & Plan Note (Signed)
Blood pressure remains greater than goal. Currently on 5 mg of amlodipine. Follow up in 2 weeks for re-check. If continues to remain high will consider adding another medication.

## 2014-02-20 NOTE — Patient Instructions (Addendum)
Thank you for choosing [facility].  Summary/Instructions:   Please stop by the lab at your convenience for blood work - please be FASTING - water and black coffee are ok.  Please follow up in 2 weeks for a follow up of blood pressure and asthma.

## 2014-02-20 NOTE — Assessment & Plan Note (Signed)
Appears well controlled based on previous A1c. Currently taking metformin with no adverse effects. Obtain A1c to determine current status and BMET for kidney function. Pt will schedule diabetic eye exam. Foot exam up to date. Continue current level of metformin.

## 2014-02-25 ENCOUNTER — Other Ambulatory Visit: Payer: Self-pay

## 2014-02-25 ENCOUNTER — Other Ambulatory Visit (INDEPENDENT_AMBULATORY_CARE_PROVIDER_SITE_OTHER): Payer: Medicare Other

## 2014-02-25 DIAGNOSIS — E785 Hyperlipidemia, unspecified: Secondary | ICD-10-CM

## 2014-02-25 DIAGNOSIS — E119 Type 2 diabetes mellitus without complications: Secondary | ICD-10-CM

## 2014-02-25 LAB — CBC
HEMATOCRIT: 39.9 % (ref 36.0–46.0)
HEMOGLOBIN: 13 g/dL (ref 12.0–15.0)
MCHC: 32.6 g/dL (ref 30.0–36.0)
MCV: 90.6 fl (ref 78.0–100.0)
Platelets: 264 10*3/uL (ref 150.0–400.0)
RBC: 4.41 Mil/uL (ref 3.87–5.11)
RDW: 13.1 % (ref 11.5–15.5)
WBC: 10.9 10*3/uL — AB (ref 4.0–10.5)

## 2014-02-25 LAB — HEPATIC FUNCTION PANEL
ALT: 12 U/L (ref 0–35)
AST: 16 U/L (ref 0–37)
Albumin: 3.3 g/dL — ABNORMAL LOW (ref 3.5–5.2)
Alkaline Phosphatase: 68 U/L (ref 39–117)
BILIRUBIN TOTAL: 0.5 mg/dL (ref 0.2–1.2)
Bilirubin, Direct: 0.1 mg/dL (ref 0.0–0.3)
Total Protein: 8 g/dL (ref 6.0–8.3)

## 2014-02-25 LAB — BASIC METABOLIC PANEL
BUN: 13 mg/dL (ref 6–23)
CALCIUM: 9.4 mg/dL (ref 8.4–10.5)
CO2: 28 meq/L (ref 19–32)
CREATININE: 0.7 mg/dL (ref 0.4–1.2)
Chloride: 104 mEq/L (ref 96–112)
GFR: 103.64 mL/min (ref >60.00)
GLUCOSE: 97 mg/dL (ref 70–99)
Potassium: 4 mEq/L (ref 3.5–5.1)
SODIUM: 139 meq/L (ref 135–145)

## 2014-02-25 LAB — HEMOGLOBIN A1C: Hgb A1c MFr Bld: 6.2 % (ref 4.6–6.5)

## 2014-02-27 ENCOUNTER — Encounter: Payer: Self-pay | Admitting: Family

## 2014-03-06 ENCOUNTER — Encounter: Payer: Self-pay | Admitting: Family

## 2014-03-06 ENCOUNTER — Ambulatory Visit (INDEPENDENT_AMBULATORY_CARE_PROVIDER_SITE_OTHER): Payer: Medicare Other | Admitting: Family

## 2014-03-06 VITALS — BP 148/80 | HR 76 | Temp 97.5°F | Resp 18 | Ht 62.0 in | Wt 122.8 lb

## 2014-03-06 DIAGNOSIS — Z712 Person consulting for explanation of examination or test findings: Secondary | ICD-10-CM | POA: Insufficient documentation

## 2014-03-06 DIAGNOSIS — Z7189 Other specified counseling: Secondary | ICD-10-CM

## 2014-03-06 NOTE — Progress Notes (Signed)
Pre visit review using our clinic review tool, if applicable. No additional management support is needed unless otherwise documented below in the visit note. 

## 2014-03-06 NOTE — Patient Instructions (Signed)
Thank you for choosing [facility].  Summary/Instructions:   Continue taking your medication as prescribed.   Follow in about 3 months for your physical  Please let us know if we can be of assistance in any way.

## 2014-03-06 NOTE — Progress Notes (Signed)
   Subjective:    Patient ID: Diane Richmond, female    DOB: 1945/10/01, 68 y.o.   MRN: [id number]  Chief Complaint  Patient presents with  . Follow-up    wants to go over lab work    HPI:  Diane JewBettie R Valenti is a 68 y.o. female who presents today for to discuss previous lab work.   Allergies  Allergen Reactions  . Atorvastatin     REACTION: legs ache  . Promethazine-Codeine [Promethazine-Codeine] Nausea And Vomiting    Pt states cough syrup she can't take   Current Outpatient Prescriptions on File Prior to Visit  Medication Sig Dispense Refill  . albuterol (PROVENTIL HFA;VENTOLIN HFA) 108 (90 BASE) MCG/ACT inhaler Inhale 2 puffs into the lungs every 6 (six) hours as needed for wheezing or shortness of breath. 1 Inhaler 0  . amLODipine (NORVASC) 5 MG tablet TAKE 1 TABLET BY MOUTH EVERY DAY 90 tablet 2  . aspirin 81 MG tablet Take 81 mg by mouth daily.      . fluticasone (FLONASE) 50 MCG/ACT nasal spray Place 2 sprays into both nostrils daily. 16 g 6  . furosemide (LASIX) 20 MG tablet Take 1 tablet (20 mg total) by mouth daily. 30 tablet 3  . glucose blood test strip Accu-chek Smart View Test strips check blood sugar daily 100 each 12  . metFORMIN (GLUCOPHAGE) 500 MG tablet TAKE 1 TABLET BY MOUTH TWICE A DAY 60 tablet 6  . rosuvastatin (CRESTOR) 20 MG tablet Take 1 tablet (20 mg total) by mouth daily. 90 tablet 0  . SYMBICORT 160-4.5 MCG/ACT inhaler INHALE 2 PUFFS INTO THE LUNGS 2 (TWO) TIMES DAILY. 10.2 g 11   No current facility-administered medications on file prior to visit.    Review of Systems    See HPI  Objective:    BP 148/80 mmHg  Pulse 76  Temp(Src) 97.5 F (36.4 C) (Oral)  Resp 18  Ht 5\' 2"  (1.575 m)  Wt 122 lb 12.8 oz (55.702 kg)  BMI 22.45 kg/m2  SpO2 99% Nursing note and vital signs reviewed.  Physical Exam  Constitutional: She is oriented to person, place, and time. She appears well-developed and well-nourished. No distress.  Cardiovascular: Normal  rate, regular rhythm, normal heart sounds and intact distal pulses.   Pulmonary/Chest: Effort normal and breath sounds normal.  Neurological: She is alert and oriented to person, place, and time.  Skin: Skin is warm and dry.  Psychiatric: She has a normal mood and affect. Her behavior is normal. Judgment and thought content normal.       Assessment & Plan:

## 2014-03-06 NOTE — Assessment & Plan Note (Signed)
Lab results from physical reviewed.

## 2014-07-15 ENCOUNTER — Other Ambulatory Visit: Payer: Self-pay

## 2014-07-15 MED ORDER — METFORMIN HCL 500 MG PO TABS: ORAL_TABLET | ORAL | Status: DC

## 2014-07-30 ENCOUNTER — Other Ambulatory Visit: Payer: Self-pay

## 2014-07-30 DIAGNOSIS — Z1231 Encounter for screening mammogram for malignant neoplasm of breast: Secondary | ICD-10-CM

## 2014-08-06 ENCOUNTER — Ambulatory Visit
Admission: RE | Admit: 2014-08-06 | Discharge: 2014-08-06 | Disposition: A | Discharge status: Home / Self Care | Payer: Medicare Other | Source: Ambulatory Visit

## 2014-08-06 DIAGNOSIS — Z1231 Encounter for screening mammogram for malignant neoplasm of breast: Secondary | ICD-10-CM

## 2014-10-23 ENCOUNTER — Other Ambulatory Visit: Payer: Self-pay | Admitting: Family Medicine

## 2014-11-10 ENCOUNTER — Other Ambulatory Visit: Payer: Self-pay | Admitting: Family Medicine

## 2014-11-14 ENCOUNTER — Other Ambulatory Visit (INDEPENDENT_AMBULATORY_CARE_PROVIDER_SITE_OTHER): Payer: Medicare Other

## 2014-11-14 ENCOUNTER — Encounter: Payer: Self-pay | Admitting: Family

## 2014-11-14 ENCOUNTER — Other Ambulatory Visit: Payer: Self-pay | Admitting: Family

## 2014-11-14 ENCOUNTER — Ambulatory Visit: Payer: Self-pay | Admitting: Family

## 2014-11-14 ENCOUNTER — Ambulatory Visit (INDEPENDENT_AMBULATORY_CARE_PROVIDER_SITE_OTHER): Payer: Medicare Other | Admitting: Family

## 2014-11-14 VITALS — BP 138/78 | HR 79 | Temp 97.4°F | Resp 18 | Ht 62.0 in | Wt 116.0 lb

## 2014-11-14 DIAGNOSIS — E785 Hyperlipidemia, unspecified: Secondary | ICD-10-CM

## 2014-11-14 DIAGNOSIS — I10 Essential (primary) hypertension: Secondary | ICD-10-CM

## 2014-11-14 DIAGNOSIS — R21 Rash and other nonspecific skin eruption: Secondary | ICD-10-CM | POA: Diagnosis not present

## 2014-11-14 LAB — BASIC METABOLIC PANEL
BUN: 19 mg/dL (ref 6–23)
CALCIUM: 9.7 mg/dL (ref 8.4–10.5)
CO2: 30 mEq/L (ref 19–32)
Chloride: 103 mEq/L (ref 96–112)
Creatinine, Ser: 0.74 mg/dL (ref 0.40–1.20)
GFR: 100.2 mL/min (ref >60.00)
GLUCOSE: 87 mg/dL (ref 70–99)
Potassium: 4.2 mEq/L (ref 3.5–5.1)
SODIUM: 139 meq/L (ref 135–145)

## 2014-11-14 LAB — LIPID PANEL
Cholesterol: 283 mg/dL — ABNORMAL HIGH (ref 0–200)
HDL: 30.6 mg/dL — ABNORMAL LOW (ref >39.00)
LDL Cholesterol: 226 mg/dL — ABNORMAL HIGH (ref 0–99)
NonHDL: 252.4
Total CHOL/HDL Ratio: 9
Triglycerides: 132 mg/dL (ref 0.0–149.0)
VLDL: 26.4 mg/dL (ref 0.0–40.0)

## 2014-11-14 MED ORDER — TRIAMCINOLONE ACETONIDE 0.025 % EX OINT: 1.0000 "application " | TOPICAL_OINTMENT | Freq: Two times a day (BID) | CUTANEOUS | Status: DC

## 2014-11-14 MED ORDER — AMLODIPINE BESYLATE 5 MG PO TABS: ORAL_TABLET | ORAL | Status: DC

## 2014-11-14 MED ORDER — ROSUVASTATIN CALCIUM 20 MG PO TABS: 20.0000 mg | ORAL_TABLET | Freq: Every day | ORAL | Status: DC

## 2014-11-14 NOTE — Patient Instructions (Signed)
Thank you for choosing [facility].  Summary/Instructions:  Decrease checking blood sugar to 2 times per week.  Continue taking other medications as prescribed.   Your prescription(s) have been submitted to your pharmacy or been printed and provided for you. Please take as directed and contact our office if you believe you are having problem(s) with the medication(s) or have any questions.  Please stop by the lab on the basement level of the building for your blood work. Your results will be released to MyChart (or called to you) after review, usually within 72 hours after test completion. If any changes need to be made, you will be notified at that same time.  If your symptoms worsen or fail to improve, please contact our office for further instruction, or in case of emergency go directly to the emergency room at the closest medical facility.

## 2014-11-14 NOTE — Assessment & Plan Note (Signed)
Stable with current dosage of amlodipine and blood pressure remains below goal of 140/90. Continue current dosage of amlodipine. Follow up in 6 months for recheck.

## 2014-11-14 NOTE — Progress Notes (Signed)
Pre visit review using our clinic review tool, if applicable. No additional management support is needed unless otherwise documented below in the visit note. 

## 2014-11-14 NOTE — Assessment & Plan Note (Signed)
Trial of medication holiday completed. Obtain lipid profile. Restart medication pending lipid profile results.

## 2014-11-14 NOTE — Assessment & Plan Note (Signed)
Small dermatitis appearing rash. Start triamcinalone cream. Follow up if symptoms worsen or fail to improve.

## 2014-11-14 NOTE — Progress Notes (Signed)
Subjective:    Patient ID: Diane Richmond, female    DOB: Jul 22, 1945, 70 y.o.   MRN: [id number]  Chief Complaint  Patient presents with  . Medication Refill    needs refill of amlodipine, wants to talk about aspirin, and cholesterol check    HPI:  Diane Richmond is a 69 y.o. female with a PMH of asthma, type 2 diabetes, hyperlipidemia, and hypertension who presents today for medication refill.    1.) Hypertension - Currently maintained on amlodipine. Takes her medication as prescribed and denies any adverse side effects.   BP Readings from Last 3 Encounters:  11/14/14 138/78  03/06/14 148/80  02/20/14 168/78    2.) Cholesterol - Currently on a medication holiday from rosuvastatin and presents today to get her cholesterol. She did have a previous reaction to the atorvastain but did not have a reaction to the rosuvastatin.   3.) Rash - Associated symptom of a rash located on the lateral aspect of her left leg has been going on for several days. Described as red and itchy. Denies any modifying factors that make it better or worse.   Allergies  Allergen Reactions  . Atorvastatin     REACTION: legs ache  . Promethazine-Codeine [Promethazine-Codeine] Nausea And Vomiting    Pt states cough syrup she can't take    Current Outpatient Prescriptions on File Prior to Visit  Medication Sig Dispense Refill  . albuterol (PROVENTIL HFA;VENTOLIN HFA) 108 (90 BASE) MCG/ACT inhaler Inhale 2 puffs into the lungs every 6 (six) hours as needed for wheezing or shortness of breath. 1 Inhaler 0  . aspirin 81 MG tablet Take 81 mg by mouth daily.      . fluticasone (FLONASE) 50 MCG/ACT nasal spray Place 2 sprays into both nostrils daily. 16 g 6  . furosemide (LASIX) 20 MG tablet Take 1 tablet (20 mg total) by mouth daily. 30 tablet 3  . glucose blood test strip Accu-chek Smart View Test strips check blood sugar daily 100 each 12  . metFORMIN (GLUCOPHAGE) 500 MG tablet TAKE 1 TABLET BY MOUTH TWICE  A DAY 60 tablet 6  . rosuvastatin (CRESTOR) 20 MG tablet Take 1 tablet (20 mg total) by mouth daily. 90 tablet 0  . SYMBICORT 160-4.5 MCG/ACT inhaler INHALE 2 PUFFS INTO THE LUNGS 2 (TWO) TIMES DAILY. 10.2 g 11   No current facility-administered medications on file prior to visit.    Past Medical History  Diagnosis Date  . Hypertension   . Hyperlipidemia   . Diabetes mellitus     type II  . HX: breast cancer   . Asthma   . Bronchitis     Review of Systems  Eyes:       Negative for changes in vision.  Respiratory: Negative for chest tightness.   Cardiovascular: Negative for chest pain, palpitations and leg swelling.  Neurological: Negative for headaches.      Objective:    BP 138/78 mmHg  Pulse 79  Temp(Src) 97.4 F (36.3 C) (Oral)  Resp 18  Ht  (1.575 m)  Wt 116 lb (52.617 kg)  BMI 21.21 kg/m2  SpO2 98% Nursing note and vital signs reviewed.  Physical Exam  Constitutional: She is oriented to person, place, and time. She appears well-developed and well-nourished. No distress.  Cardiovascular: Normal rate, regular rhythm, normal heart sounds and intact distal pulses.   Pulmonary/Chest: Effort normal and breath sounds normal.  Neurological: She is alert and oriented to person, place, and  time.  Skin: Skin is warm and dry.  Oblong shaped maculopapular rash noted on the lateral aspect of the left knee.   Psychiatric: She has a normal mood and affect. Her behavior is normal. Judgment and thought content normal.       Assessment & Plan:   Problem List Items Addressed This Visit      Cardiovascular and Mediastinum   Essential hypertension    Stable with current dosage of amlodipine and blood pressure remains below goal of 140/90. Continue current dosage of amlodipine. Follow up in 6 months for recheck.       Relevant Medications   amLODipine (NORVASC) 5 MG tablet     Musculoskeletal and Integument   Rash and nonspecific skin eruption    Small dermatitis  appearing rash. Start triamcinalone cream. Follow up if symptoms worsen or fail to improve.       Relevant Medications   triamcinolone (KENALOG) 0.025 % ointment     Other   Hyperlipidemia - Primary    Trial of medication holiday completed. Obtain lipid profile. Restart medication pending lipid profile results.       Relevant Medications   amLODipine (NORVASC) 5 MG tablet   Other Relevant Orders   Basic Metabolic Panel (BMET)   Lipid Profile

## 2014-11-18 ENCOUNTER — Ambulatory Visit: Payer: Medicare Other | Admitting: Family

## 2014-11-19 ENCOUNTER — Telehealth: Payer: Self-pay | Admitting: Family

## 2014-11-19 NOTE — Telephone Encounter (Signed)
Patient no showed for OV on 7/26.  Please advise.

## 2014-11-20 NOTE — Telephone Encounter (Signed)
noted 

## 2014-11-20 NOTE — Telephone Encounter (Signed)
Ok to reschedule if pt calls

## 2014-11-27 ENCOUNTER — Telehealth: Payer: Self-pay | Admitting: Family

## 2014-11-27 NOTE — Telephone Encounter (Signed)
Is requesting crestor  to be reduced.  Daughter believes this is too high a dose b/c mother is a small woman and is making patient sick when she takes it.

## 2014-11-27 NOTE — Telephone Encounter (Signed)
It is fine to reduce the dose to 10 mg daily. Please have her make a follow up office visit to discuss how the medication is making her sick.

## 2014-11-28 NOTE — Telephone Encounter (Signed)
LVM for pts daughter to call back.  

## 2014-12-02 ENCOUNTER — Other Ambulatory Visit: Payer: Self-pay | Admitting: Family

## 2014-12-02 MED ORDER — EZETIMIBE 10 MG PO TABS: 10.0000 mg | ORAL_TABLET | Freq: Every day | ORAL | Status: DC

## 2014-12-02 NOTE — Telephone Encounter (Signed)
Please have her discontinue the medication and I will send in a prescription for Zetia to her pharmacy. Please have her follow up after starting the medication.

## 2014-12-02 NOTE — Telephone Encounter (Signed)
She just feels like its too strong. She didn't feel good like body aches when she took it.

## 2014-12-03 MED ORDER — EZETIMIBE 10 MG PO TABS: 10.0000 mg | ORAL_TABLET | Freq: Every day | ORAL | Status: DC

## 2014-12-03 NOTE — Telephone Encounter (Signed)
Pt aware.

## 2015-03-01 ENCOUNTER — Other Ambulatory Visit: Payer: Self-pay | Admitting: Family Medicine

## 2015-03-11 ENCOUNTER — Other Ambulatory Visit: Payer: Self-pay | Admitting: Family Medicine

## 2015-03-16 ENCOUNTER — Telehealth: Payer: Self-pay | Admitting: Family

## 2015-03-16 NOTE — Telephone Encounter (Signed)
Pt requesting refill for glucose blood test strip [[id number] Pharmacy is [facility] on [address]

## 2015-03-17 MED ORDER — GLUCOSE BLOOD VI STRP: ORAL_STRIP | Status: DC

## 2015-03-17 NOTE — Telephone Encounter (Signed)
Rx refilled.

## 2015-06-04 ENCOUNTER — Encounter: Payer: Self-pay | Admitting: Family

## 2015-06-11 ENCOUNTER — Other Ambulatory Visit (INDEPENDENT_AMBULATORY_CARE_PROVIDER_SITE_OTHER): Payer: Medicare Other

## 2015-06-11 ENCOUNTER — Ambulatory Visit (INDEPENDENT_AMBULATORY_CARE_PROVIDER_SITE_OTHER): Payer: Medicare Other | Admitting: Family

## 2015-06-11 ENCOUNTER — Encounter: Payer: Self-pay | Admitting: Family

## 2015-06-11 VITALS — BP 160/82 | HR 83 | Temp 97.6°F | Resp 14 | Ht 62.0 in | Wt 118.0 lb

## 2015-06-11 DIAGNOSIS — Z0000 Encounter for general adult medical examination without abnormal findings: Secondary | ICD-10-CM

## 2015-06-11 DIAGNOSIS — E119 Type 2 diabetes mellitus without complications: Secondary | ICD-10-CM | POA: Diagnosis not present

## 2015-06-11 LAB — CBC
HEMATOCRIT: 38.9 % (ref 36.0–46.0)
HEMOGLOBIN: 12.9 g/dL (ref 12.0–15.0)
MCHC: 33.1 g/dL (ref 30.0–36.0)
MCV: 89.2 fl (ref 78.0–100.0)
Platelets: 326 10*3/uL (ref 150.0–400.0)
RBC: 4.36 Mil/uL (ref 3.87–5.11)
RDW: 13.4 % (ref 11.5–15.5)
WBC: 11.5 10*3/uL — ABNORMAL HIGH (ref 4.0–10.5)

## 2015-06-11 LAB — COMPREHENSIVE METABOLIC PANEL
ALK PHOS: 79 U/L (ref 39–117)
ALT: 9 U/L (ref 0–35)
AST: 15 U/L (ref 0–37)
Albumin: 4.1 g/dL (ref 3.5–5.2)
BILIRUBIN TOTAL: 0.4 mg/dL (ref 0.2–1.2)
BUN: 18 mg/dL (ref 6–23)
CALCIUM: 9.7 mg/dL (ref 8.4–10.5)
CO2: 30 mEq/L (ref 19–32)
Chloride: 104 mEq/L (ref 96–112)
Creatinine, Ser: 0.77 mg/dL (ref 0.40–1.20)
GFR: 95.54 mL/min (ref >60.00)
GLUCOSE: 83 mg/dL (ref 70–99)
Potassium: 4.2 mEq/L (ref 3.5–5.1)
Sodium: 140 mEq/L (ref 135–145)
TOTAL PROTEIN: 8.2 g/dL (ref 6.0–8.3)

## 2015-06-11 LAB — TSH: TSH: 0.94 u[IU]/mL (ref 0.35–4.50)

## 2015-06-11 LAB — LIPID PANEL
CHOL/HDL RATIO: 10
Cholesterol: 302 mg/dL — ABNORMAL HIGH (ref 0–200)
HDL: 31 mg/dL — AB (ref >39.00)
LDL Cholesterol: 237 mg/dL — ABNORMAL HIGH (ref 0–99)
NONHDL: 270.65
TRIGLYCERIDES: 167 mg/dL — AB (ref 0.0–149.0)
VLDL: 33.4 mg/dL (ref 0.0–40.0)

## 2015-06-11 LAB — HEMOGLOBIN A1C: Hgb A1c MFr Bld: 6.3 % (ref 4.6–6.5)

## 2015-06-11 NOTE — Progress Notes (Addendum)
Subjective:    Patient ID: Diane Richmond, female    DOB: Sep 07, 1945, 70 y.o.   MRN: [id number]  Chief Complaint  Patient presents with  . CPE    Fasting    HPI:  Diane Richmond is a 70 y.o. female who presents today for an annual wellness visit.   1) Health Maintenance -   Diet - Averages of about 3 meals per day consisting of chicken, fish, cereal, vegetables, fruit; 4-5 cups of caffeine  Exercise -  2) Preventative Exams / Immunizations:  Dental -- Due for exam  Vision -- Due for exam   Health Maintenance  Topic Date Due  . Hepatitis C Screening  03/05/1946  . OPHTHALMOLOGY EXAM  04/13/1956  . URINE MICROALBUMIN  04/13/1956  . DEXA SCAN  04/14/2011  . PNA vac Low Risk Adult (1 of 2 - PCV13) 04/14/2011  . HEMOGLOBIN A1C  08/26/2014  . FOOT EXAM  09/24/2014  . INFLUENZA VACCINE  01/08/2016 (Originally 11/24/2014)  . MAMMOGRAM  08/05/2016  . COLONOSCOPY  07/20/2021  . TETANUS/TDAP  07/20/2021  . ZOSTAVAX  Addressed  Declined PNA.   There is no immunization history for the selected administration types on file for this patient.   RISK FACTORS  Tobacco History  Smoking status  . Never Smoker   Smokeless tobacco  . Former Neurosurgeon  . Quit date: 03/02/1963     Cardiac risk factors: advanced age (older than 24 for men, 76 for women), diabetes mellitus, dyslipidemia and hypertension.  Depression Screen  Q1: Over the past two weeks, have you felt down, depressed or hopeless? yes  Q2: Over the past two weeks, have you felt little interest or pleasure in doing things? No  Have you lost interest or pleasure in daily life? No  Do you often feel hopeless? No  Do you cry easily over simple problems? No  Activities of Daily Living In your present state of health, do you have any difficulty performing the following activities?:  Driving? No Managing money?  No Feeding yourself? No Getting from bed to chair? No Climbing a flight of stairs? No Preparing food  and eating?: No Bathing or showering? No Getting dressed: No Getting to the toilet? No Using the toilet: No Moving around from place to place: No In the past year have you fallen or had a near fall?:No   Home Safety Has smoke detector and wears seat belts. No firearms. No excess sun exposure. Are there smokers in your home (other than you)?  No Do you feel safe at home?  Yes  Hearing Difficulties: No Do you often ask people to speak up or repeat themselves? No Do you experience ringing or noises in your ears? No  Do you have difficulty understanding soft or whispered voices? No    Cognitive Testing  Alert? Yes   Normal Appearance? Yes  Oriented to person? Yes  Place? Yes   Time? Yes  Recall of three objects?  Yes  Can perform simple calculations? Yes  Displays appropriate judgment? Yes  Can read the correct time from a watch face? Yes  Do you feel that you have a problem with memory? No  Do you often misplace items? No   Advanced Directives have been discussed with the patient? Yes  Current Physicians/Providers and Suppliers  1. Marcos Eke, FNP - Internal Medicine   Indicate any recent Medical Services you may have received from other than [facility] providers in the past year (date may be  approximate).  All answers were reviewed with the patient and necessary referrals were made:  Jeanine Luz, FNP   06/11/2015   Allergies  Allergen Reactions  . Atorvastatin     REACTION: legs ache  . Promethazine-Codeine [Promethazine-Codeine] Nausea And Vomiting    Pt states cough syrup she can't take     Outpatient Prescriptions Prior to Visit  Medication Sig Dispense Refill  . albuterol (PROVENTIL HFA;VENTOLIN HFA) 108 (90 BASE) MCG/ACT inhaler Inhale 2 puffs into the lungs every 6 (six) hours as needed for wheezing or shortness of breath. 1 Inhaler 0  . amLODipine (NORVASC) 5 MG tablet TAKE 1 TABLET BY MOUTH EVERY DAY 90 tablet 3  . aspirin 81 MG tablet Take 81 mg by mouth  daily.      . fluticasone (FLONASE) 50 MCG/ACT nasal spray Place 2 sprays into both nostrils daily. 16 g 6  . glucose blood test strip Accu-chek Smart View Test strips check blood sugar daily 100 each 12  . metFORMIN (GLUCOPHAGE) 500 MG tablet TAKE 1 TABLET BY MOUTH TWICE A DAY 60 tablet 2  . rosuvastatin (CRESTOR) 20 MG tablet Take 1 tablet (20 mg total) by mouth daily. 90 tablet 1  . SYMBICORT 160-4.5 MCG/ACT inhaler INHALE 2 PUFFS INTO THE LUNGS 2 (TWO) TIMES DAILY. 10.2 g 11  . ezetimibe (ZETIA) 10 MG tablet Take 1 tablet (10 mg total) by mouth daily. 30 tablet 0  . ezetimibe (ZETIA) 10 MG tablet Take 1 tablet (10 mg total) by mouth daily. 90 tablet 3  . furosemide (LASIX) 20 MG tablet Take 1 tablet (20 mg total) by mouth daily. 30 tablet 3  . triamcinolone (KENALOG) 0.025 % ointment Apply 1 application topically 2 (two) times daily. 30 g 0   No facility-administered medications prior to visit.     Past Medical History  Diagnosis Date  . Hypertension   . Hyperlipidemia   . Diabetes mellitus     type II  . HX: breast cancer   . Asthma   . Bronchitis      Past Surgical History  Procedure Laterality Date  . Breast lumpectomy  1994    left  . Tonsillectomy    . Right port-a-cath  1994     Family History  Problem Relation Age of Onset  . Cancer Neg Hx   . Breast cancer    . Diabetes    . Hypertension    . Heart disease Mother     CAD/MI  . Aneurysm Father   . Stroke Sister   . Hypertension Sister      Social History   Social History  . Marital Status: Married    Spouse Name: N/A  . Number of Children: 4  . Years of Education: 8   Occupational History  . nurse assistant - retired    Social History Main Topics  . Smoking status: Never Smoker   . Smokeless tobacco: Former Neurosurgeon    Quit date: 03/02/1963  . Alcohol Use: No  . Drug Use: No  . Sexual Activity: Not Currently   Other Topics Concern  . Not on file   Social History Narrative   8th grade.  Married '67 - .  4 dtrs - '67, '70, '72, '78. Grandchildren - 4. 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 Lives with husband.    Denies abuse and feels safe at home.      Review of Systems  Constitutional: Denies fever, chills, fatigue, or significant weight gain/loss. HENT:  Head: Denies headache or neck pain Ears: Denies changes in hearing, ringing in ears, earache, drainage Nose: Denies discharge, stuffiness, itching, nosebleed, sinus pain Throat: Denies sore throat, hoarseness, dry mouth, sores, thrush Eyes: Denies loss/changes in vision, pain, redness, blurry/double vision, flashing lights Cardiovascular: Denies chest pain/discomfort, tightness, palpitations, shortness of breath with activity, difficulty lying down, swelling, sudden awakening with shortness of breath Respiratory: Denies shortness of breath, cough, sputum production, wheezing Gastrointestinal: Denies dysphasia, heartburn, change in appetite, nausea, change in bowel habits, rectal bleeding, constipation, diarrhea, yellow skin or eyes Genitourinary: Denies frequency, urgency, burning/pain, blood in urine, incontinence, change in urinary strength. Musculoskeletal: Denies muscle/joint pain, stiffness, back pain, redness or swelling of joints, trauma Skin: Denies rashes, lumps, itching, dryness, color changes, or hair/nail changes Neurological: Denies dizziness, fainting, seizures, weakness, numbness, tingling, tremor Psychiatric - Denies nervousness, stress, depression or memory loss Endocrine: Denies heat or cold intolerance, sweating, frequent urination, excessive thirst, changes in appetite Hematologic: Denies ease of bruising or bleeding    Objective:     BP 160/82 mmHg  Pulse 83  Temp(Src) 97.6 F (36.4 C) (Oral)  Resp 14  Ht  (1.575 m)  Wt 118 lb (53.524 kg)  BMI 21.58 kg/m2  SpO2 99% Nursing note and vital signs reviewed.  Physical Exam  Constitutional: She is oriented to person, place, and time. She appears  well-developed and well-nourished.  HENT:  Head: Normocephalic.  Right Ear: Hearing, tympanic membrane, external ear and ear canal normal.  Left Ear: Hearing, tympanic membrane, external ear and ear canal normal.  Nose: Nose normal.  Mouth/Throat: Uvula is midline, oropharynx is clear and moist and mucous membranes are normal.  Eyes: Conjunctivae and EOM are normal. Pupils are equal, round, and reactive to light.  Neck: Neck supple. No JVD present. No tracheal deviation present. No thyromegaly present.  Cardiovascular: Normal rate, regular rhythm, normal heart sounds and intact distal pulses.   Pulmonary/Chest: Effort normal and breath sounds normal.  Abdominal: Soft. Bowel sounds are normal. She exhibits no distension and no mass. There is no tenderness. There is no rebound and no guarding.  Musculoskeletal: Normal range of motion. She exhibits no edema or tenderness.  Lymphadenopathy:    She has no cervical adenopathy.  Neurological: She is alert and oriented to person, place, and time. She has normal reflexes. No cranial nerve deficit. She exhibits normal muscle tone. Coordination normal.  Skin: Skin is warm and dry.  Psychiatric: She has a normal mood and affect. Her behavior is normal. Judgment and thought content normal.       Assessment & Plan:   During the course of the visit the patient was educated and counseled about appropriate screening and preventive services including:    Pneumococcal vaccine   Influenza vaccine  Td vaccine  Prostate cancer screening  Colorectal cancer screening  Nutrition counseling   Diet review for nutrition referral? Yes ____  Not Indicated _X___   Patient Instructions (the written plan) was given to the patient.  Medicare Attestation I have personally reviewed: The patient's medical and social history Their use of alcohol, tobacco or illicit drugs Their current medications and supplements The patient's functional ability including  ADLs,fall risks, home safety risks, cognitive, and hearing and visual impairment Diet and physical activities Evidence for depression or mood disorders  The patient's weight, height, BMI,  have been recorded in the chart.  I have made referrals, counseling, and provided education to the patient based on review of the above and I  have provided the patient with a written personalized care plan for preventive services.     Jeanine Luz, FNP   06/11/2015    Problem List Items Addressed This Visit      Endocrine   DM II (diabetes mellitus, type II), controlled (HCC)   Relevant Orders   Hemoglobin A1c (Completed)   Urine Microalbumin w/creat. ratio     Other   Routine general medical examination at a health care facility    1) Anticipatory Guidance: Discussed importance of wearing a seatbelt while driving and not texting while driving; changing batteries in smoke detector at least once annually; wearing suntan lotion when outside; eating a balanced and moderate diet; getting physical activity at least 30 minutes per day.  2) Immunizations / Screenings / Labs:  Declines influenza and pneumonia vaccinations. All other immunizations are up to date per recommendations. Depression screen completed and negative. Obtain A1c and urine microalbumin for diabetes screen.  Due for a vision and dental screen encouraged to be completed independently. All other screenings are up-to-date per recommendations. Obtain CBC, CMET, Lipid profile and TSH.   Overall well exam with risk factors for cardiovascular disease including hypertension, type 2 diabetes, and hyperlipidemia. Chronic conditions are currently managed with medication  And will be evaluated through blood work. Continue current medication at this time. Encouraged increasing physical activity to 30 minutes of moderate level activity daily. Encouraged consumption of a diet that is balanced, varied, and moderate and emphasizes nutrient dense foods and is  low in saturated fats and processed/sugary foods. Continue other healthy lifestyle behaviors and choices. Follow-up prevention exam in 1 year. Follow-up office visit for chronic conditions pending blood work.        Relevant Orders   TSH (Completed)   Lipid panel (Completed)   Comprehensive metabolic panel (Completed)   CBC (Completed)   Hemoglobin A1c (Completed)   Urine Microalbumin w/creat. ratio   Medicare annual wellness visit, subsequent - Primary    Reviewed and updated patient's medical, surgical, family and social history. Medications and allergies were also reviewed. Basic screenings for depression, activities of daily living, hearing, cognition and safety were performed. Provider list was updated and health plan was provided to the patient.

## 2015-06-11 NOTE — Progress Notes (Signed)
Pre visit review using our clinic review tool, if applicable. No additional management support is needed unless otherwise documented below in the visit note. 

## 2015-06-11 NOTE — Assessment & Plan Note (Signed)
1) Anticipatory Guidance: Discussed importance of wearing a seatbelt while driving and not texting while driving; changing batteries in smoke detector at least once annually; wearing suntan lotion when outside; eating a balanced and moderate diet; getting physical activity at least 30 minutes per day.  2) Immunizations / Screenings / Labs:  Declines influenza and pneumonia vaccinations. All other immunizations are up to date per recommendations. Depression screen completed and negative. Obtain A1c and urine microalbumin for diabetes screen.  Due for a vision and dental screen encouraged to be completed independently. All other screenings are up-to-date per recommendations. Obtain CBC, CMET, Lipid profile and TSH.   Overall well exam with risk factors for cardiovascular disease including hypertension, type 2 diabetes, and hyperlipidemia. Chronic conditions are currently managed with medication  And will be evaluated through blood work. Continue current medication at this time. Encouraged increasing physical activity to 30 minutes of moderate level activity daily. Encouraged consumption of a diet that is balanced, varied, and moderate and emphasizes nutrient dense foods and is low in saturated fats and processed/sugary foods. Continue other healthy lifestyle behaviors and choices. Follow-up prevention exam in 1 year. Follow-up office visit for chronic conditions pending blood work.

## 2015-06-11 NOTE — Assessment & Plan Note (Signed)
Reviewed and updated patient's medical, surgical, family and social history. Medications and allergies were also reviewed. Basic screenings for depression, activities of daily living, hearing, cognition and safety were performed. Provider list was updated and health plan was provided to the patient.  

## 2015-06-11 NOTE — Patient Instructions (Signed)
Thank you for choosing [facility].  Summary/Instructions:  [facility]  Topic Date Due  . Hepatitis C Screening  70-Jun-1947  . OPHTHALMOLOGY EXAM  04/13/1986  . URINE MICROALBUMIN  04/13/1986  . DEXA SCAN  04/13/2041  . PNA vac Low Risk Adult (1 of 2 - PCV13) 04/13/2041  . HEMOGLOBIN A1C  08/25/2044  . FOOT EXAM  09/23/2044  . INFLUENZA VACCINE  01/07/2046 (Originally 11/23/2044)  . MAMMOGRAM  08/06/2046  . COLONOSCOPY  07/21/2071  . TETANUS/TDAP  07/21/2071  . [facility], Female Adopting a healthy lifestyle and getting preventive care can go a long way to promote health and wellness. Talk with your health care provider about what schedule of regular examinations is right for you. This is a good chance for you to check in with your provider about disease prevention and staying healthy. In between checkups, there are plenty of things you can do on your own. Experts have done a lot of research about which lifestyle changes and preventive measures are most likely to keep you healthy. Ask your health care provider for more information. WEIGHT AND DIET  Eat a healthy diet  Be sure to include plenty of vegetables, fruits, low-fat dairy products, and lean protein.  Do not eat a lot of foods high in solid fats, added sugars, or salt.  Get regular exercise. This is one of the most important things you can do for your health.  Most adults should exercise for at least 150 minutes each week. The exercise should increase your heart rate and make you sweat (moderate-intensity exercise).  Most adults should also do strengthening exercises at least twice a week. This is in addition to the moderate-intensity exercise.  Maintain a healthy weight  Body mass index (BMI) is a measurement that can be used to identify possible weight problems. It estimates body fat based on height and weight. Your health care provider can help determine your BMI and help you  achieve or maintain a healthy weight.  For females 70 years of age and older:   A BMI below 18.5 is considered underweight.  A BMI of 18.5 to 24.9 is normal.  A BMI of 25 to 29.9 is considered overweight.  A BMI of 30 and above is considered obese.  Watch levels of cholesterol and blood lipids  You should start having your blood tested for lipids and cholesterol at 70 years of age, then have this test every 5 years.  You may need to have your cholesterol levels checked more often if:  Your lipid or cholesterol levels are high.  You are older than 70 years of age.  You are at high risk for heart disease.  CANCER SCREENING   Lung Cancer  Lung cancer screening is recommended for adults 70-80 years old who are at high risk for lung cancer because of a history of smoking.  A yearly low-dose CT scan of the lungs is recommended for people who:  Currently smoke.  Have quit within the past 15 years.  Have at least a 30-pack-year history of smoking. A pack year is smoking an average of one pack of cigarettes a day for 1 year.  Yearly screening should continue until it has been 70 years since you quit.  Yearly screening should stop if you develop a health problem that would prevent you from having lung cancer treatment.  Breast Cancer  Practice breast self-awareness. This means understanding how your breasts normally appear and feel.  It also means doing regular breast self-exams. Let your health care provider know about any changes, no matter how small.  If you are in your 70s or 70s, you should have a clinical breast exam (CBE) by a health care provider every 1-3 years as part of a regular health exam.  If you are 70 or older, have a CBE every year. Also consider having a breast X-ray (mammogram) every year.  If you have a family history of breast cancer, talk to your health care provider about genetic screening.  If you are at high risk for breast cancer, talk to your  health care provider about having an MRI and a mammogram every year.  Breast cancer gene (BRCA) assessment is recommended for women who have family members with BRCA-related cancers. BRCA-related cancers include:  Breast.  Ovarian.  Tubal.  Peritoneal cancers.  Results of the assessment will determine the need for genetic counseling and BRCA1 and BRCA2 testing. Cervical Cancer Your health care provider may recommend that you be screened regularly for cancer of the pelvic organs (ovaries, uterus, and vagina). This screening involves a pelvic examination, including checking for microscopic changes to the surface of your cervix (Pap test). You may be encouraged to have this screening done every 3 years, beginning at age 70.  For women ages 70-70, health care providers may recommend pelvic exams and Pap testing every 3 years, or they may recommend the Pap and pelvic exam, combined with testing for human papilloma virus (HPV), every 5 years. Some types of HPV increase your risk of cervical cancer. Testing for HPV may also be done on women of any age with unclear Pap test results.  Other health care providers may not recommend any screening for nonpregnant women who are considered low risk for pelvic cancer and who do not have symptoms. Ask your health care provider if a screening pelvic exam is right for you.  If you have had past treatment for cervical cancer or a condition that could lead to cancer, you need Pap tests and screening for cancer for at least 20 years after your treatment. If Pap tests have been discontinued, your risk factors (such as having a new sexual partner) need to be reassessed to determine if screening should resume. Some women have medical problems that increase the chance of getting cervical cancer. In these cases, your health care provider may recommend more frequent screening and Pap tests. Colorectal Cancer  This type of cancer can be detected and often  prevented.  Routine colorectal cancer screening usually begins at 70 years of age and continues through 70 years of age.  Your health care provider may recommend screening at an earlier age if you have risk factors for colon cancer.  Your health care provider may also recommend using home test kits to check for hidden blood in the stool.  A small camera at the end of a tube can be used to examine your colon directly (sigmoidoscopy or colonoscopy). This is done to check for the earliest forms of colorectal cancer.  Routine screening usually begins at age 66.  Direct examination of the colon should be repeated every 5-10 years through 70 years of age. However, you may need to be screened more often if early forms of precancerous polyps or small growths are found. Skin Cancer  Check your skin from head to toe regularly.  Tell your health care provider about any new moles or changes in moles, especially if there is a change in a  shape or color.  Also tell your health care provider if you have a mole that is larger than the size of a pencil eraser.  Always use sunscreen. Apply sunscreen liberally and repeatedly throughout the day.  Protect yourself by wearing long sleeves, pants, a wide-brimmed hat, and sunglasses whenever you are outside. HEART DISEASE, DIABETES, AND HIGH BLOOD PRESSURE   High blood pressure causes heart disease and increases the risk of stroke. High blood pressure is more likely to develop in:  People who have blood pressure in the high end of the normal range (130-139/85-89 mm Hg).  People who are overweight or obese.  People who are African American.  If you are 18-39 years of age, have your blood pressure checked every 3-5 years. If you are 40 years of age or older, have your blood pressure checked every year. You should have your blood pressure measured twice--once when you are at a hospital or clinic, and once when you are not at a hospital or clinic.  Record the average of the two measurements. To check your blood pressure when you are not at a hospital or clinic, you can use:  An automated blood pressure machine at a pharmacy.  A home blood pressure monitor.  If you are between 55 years and 79 years old, ask your health care provider if you should take aspirin to prevent strokes.  Have regular diabetes screenings. This involves taking a blood sample to check your fasting blood sugar level.  If you are at a normal weight and have a low risk for diabetes, have this test once every three years after 70 years of age.  If you are overweight and have a high risk for diabetes, consider being tested at a younger age or more often. PREVENTING INFECTION  Hepatitis B  If you have a higher risk for hepatitis B, you should be screened for this virus. You are considered at high risk for hepatitis B if:  You were born in a country where hepatitis B is common. Ask your health care provider which countries are considered high risk.  Your parents were born in a high-risk country, and you have not been immunized against hepatitis B (hepatitis B vaccine).  You have HIV or AIDS.  You use needles to inject street drugs.  You live with someone who has hepatitis B.  You have had sex with someone who has hepatitis B.  You get hemodialysis treatment.  You take certain medicines for conditions, including cancer, organ transplantation, and autoimmune conditions. Hepatitis C  Blood testing is recommended for:  Everyone born from 1945 through 1965.  Anyone with known risk factors for hepatitis C. Sexually transmitted infections (STIs)  You should be screened for sexually transmitted infections (STIs) including gonorrhea and chlamydia if:  You are sexually active and are younger than 70 years of age.  You are older than 70 years of age and your health care provider tells you that you are at risk for this type of infection.  Your sexual activity  has changed since you were last screened and you are at an increased risk for chlamydia or gonorrhea. Ask your health care provider if you are at risk.  If you do not have HIV, but are at risk, it may be recommended that you take a prescription medicine daily to prevent HIV infection. This is called pre-exposure prophylaxis (PrEP). You are considered at risk if:  You are sexually active and do not regularly use condoms or know the   the HIV status of your partner(s).  You take drugs by injection.  You are sexually active with a partner who has HIV. Talk with your health care provider about whether you are at high risk of being infected with HIV. If you choose to begin PrEP, you should first be tested for HIV. You should then be tested every 3 months for as long as you are taking PrEP.  PREGNANCY   If you are premenopausal and you may become pregnant, ask your health care provider about preconception counseling.  If you may become pregnant, take 400 to 800 micrograms (mcg) of folic acid every day.  If you want to prevent pregnancy, talk to your health care provider about birth control (contraception). OSTEOPOROSIS AND MENOPAUSE   Osteoporosis is a disease in which the bones lose minerals and strength with aging. This can result in serious bone fractures. Your risk for osteoporosis can be identified using a bone density scan.  If you are 76 years of age or older, or if you are at risk for osteoporosis and fractures, ask your health care provider if you should be screened.  Ask your health care provider whether you should take a calcium or vitamin D supplement to lower your risk for osteoporosis.  Menopause may have certain physical symptoms and risks.  Hormone replacement therapy may reduce some of these symptoms and risks. Talk to your health care provider about whether hormone replacement therapy is right for you.  HOME CARE INSTRUCTIONS   Schedule regular health, dental, and eye  exams.  Stay current with your immunizations.   Do not use any tobacco products including cigarettes, chewing tobacco, or electronic cigarettes.  If you are pregnant, do not drink alcohol.  If you are breastfeeding, limit how much and how often you drink alcohol.  Limit alcohol intake to no more than 1 drink per day for nonpregnant women. One drink equals 12 ounces of beer, 5 ounces of wine, or 1 ounces of hard liquor.  Do not use street drugs.  Do not share needles.  Ask your health care provider for help if you need support or information about quitting drugs.  Tell your health care provider if you often feel depressed.  Tell your health care provider if you have ever been abused or do not feel safe at home.   This information is not intended to replace advice given to you by your health care provider. Make sure you discuss any questions you have with your health care provider.   Document Released: 10/25/2010 Document Revised: 05/02/2014 Document Reviewed: 03/13/2013 Elsevier Interactive Patient Education 2016 [place] Directive Advance directives are the legal documents that allow you to make choices about your health care and medical treatment if you cannot speak for yourself. Advance directives are a way for you to communicate your wishes to family, friends, and health care providers. The specified people can then convey your decisions about end-of-life care to avoid confusion if you should become unable to communicate. Ideally, the process of discussing and writing advance directives should happen over time rather than making decisions all at once. Advance directives can be modified as your situation changes, and you can change your mind at any time, even after you have signed the advance directives. Each state has its own laws regarding advance directives. You may want to check with your health care provider, attorney, or state representative about the law in  your state. Below are some examples of advance directives. LIVING WILL  A living will is a set of instructions documenting your wishes about medical care when you cannot care for yourself. It is used if you become:  Terminally ill.  Incapacitated.  Unable to communicate.  Unable to make decisions. Items to consider in your living will include:  The use or non-use of life-sustaining equipment, such as dialysis machines and breathing machines (ventilators).  A do not resuscitate (DNR) order, which is the instruction not to use cardiopulmonary resuscitation (CPR) if breathing or heartbeat stops.  Tube feeding.  Withholding of food and fluids.  Comfort (palliative) care when the goal becomes comfort rather than a cure.  Organ and tissue donation. A living will does not give instructions about distribution of your money and property if you should pass away. It is advisable to seek the expert advice of a lawyer in drawing up a will regarding your possessions. Decisions about taxes, beneficiaries, and asset distribution will be legally binding. This process can relieve your family and friends of any burdens surrounding disputes or questions that may come up about the allocation of your assets. DO NOT RESUSCITATE (DNR) A do not resuscitate (DNR) order is a request to not have CPR in the event that your heart stops beating or you stop breathing. Unless given other instructions, a health care provider will try to help any patient whose heart has stopped or who has stopped breathing.  HEALTH CARE PROXY AND DURABLE POWER OF ATTORNEY FOR HEALTH CARE A health care proxy is a person (agent) appointed to make medical decisions for you if you cannot. Generally, people choose someone they know well and trust to represent their preferences when they can no longer do so. You should be sure to ask this person for agreement to act as your agent. An agent may have to exercise judgment in the event of a medical  decision for which your wishes are not known. The durable power of attorney for health care is the legal document that names your health care proxy. Once written, it should be:  Signed.  Notarized.  Dated.  Copied.  Witnessed.  Incorporated into your medical record. You may also want to appoint someone to manage your financial affairs if you cannot. This is called a durable power of attorney for finances. It is a separate legal document from the durable power of attorney for health care. You may choose the same person or someone different from your health care proxy to act as your agent in financial matters.   This information is not intended to replace advice given to you by your health care provider. Make sure you discuss any questions you have with your health care provider.   Document Released: 07/19/2007 Document Revised: 04/16/2013 Document Reviewed: 08/29/2012 Elsevier Interactive Patient Education [employer].

## 2015-06-14 ENCOUNTER — Other Ambulatory Visit: Payer: Self-pay | Admitting: Family

## 2015-06-14 ENCOUNTER — Encounter: Payer: Self-pay | Admitting: Family

## 2015-06-14 DIAGNOSIS — E785 Hyperlipidemia, unspecified: Secondary | ICD-10-CM

## 2015-06-16 ENCOUNTER — Other Ambulatory Visit: Payer: Self-pay | Admitting: Family Medicine

## 2015-07-24 ENCOUNTER — Ambulatory Visit: Payer: Medicare Other | Admitting: Internal Medicine

## 2015-07-31 ENCOUNTER — Encounter: Payer: Self-pay | Admitting: Internal Medicine

## 2015-07-31 ENCOUNTER — Ambulatory Visit (INDEPENDENT_AMBULATORY_CARE_PROVIDER_SITE_OTHER): Payer: Medicare Other | Admitting: Internal Medicine

## 2015-07-31 VITALS — BP 162/86 | HR 79 | Ht 62.0 in | Wt 119.8 lb

## 2015-07-31 DIAGNOSIS — E119 Type 2 diabetes mellitus without complications: Secondary | ICD-10-CM

## 2015-07-31 DIAGNOSIS — E7801 Familial hypercholesterolemia: Secondary | ICD-10-CM | POA: Diagnosis not present

## 2015-07-31 DIAGNOSIS — Z79899 Other long term (current) drug therapy: Secondary | ICD-10-CM

## 2015-07-31 DIAGNOSIS — I739 Peripheral vascular disease, unspecified: Secondary | ICD-10-CM

## 2015-07-31 DIAGNOSIS — I447 Left bundle-branch block, unspecified: Secondary | ICD-10-CM

## 2015-07-31 DIAGNOSIS — I10 Essential (primary) hypertension: Secondary | ICD-10-CM

## 2015-07-31 DIAGNOSIS — R5383 Other fatigue: Secondary | ICD-10-CM

## 2015-07-31 DIAGNOSIS — R0989 Other specified symptoms and signs involving the circulatory and respiratory systems: Secondary | ICD-10-CM

## 2015-07-31 DIAGNOSIS — R011 Cardiac murmur, unspecified: Secondary | ICD-10-CM

## 2015-07-31 DIAGNOSIS — I7090 Unspecified atherosclerosis: Secondary | ICD-10-CM

## 2015-07-31 MED ORDER — ROSUVASTATIN CALCIUM 10 MG PO TABS: 10.0000 mg | ORAL_TABLET | Freq: Every day | ORAL | Status: DC

## 2015-07-31 NOTE — Patient Instructions (Signed)
Medication Instructions:   START crestor 10mg  once daily  Labwork:  Lab work (CMET & CK) in ONE WEEK  Your physician recommends that you return for lab work in TWO MONTHS - fasting - to recheck cholesterol   Testing/Procedures:  Your physician has requested that you have a carotid duplex. This test is an ultrasound of the carotid arteries in your neck. It looks at blood flow through these arteries that supply the brain with blood. Allow one hour for this exam. There are no restrictions or special instructions.  Your physician has requested that you have an echocardiogram @ [address]. Echocardiography is a painless test that uses sound waves to create images of your heart. It provides your doctor with information about the size and shape of your heart and how well your heart's chambers and valves are working. This procedure takes approximately one hour. There are no restrictions for this procedure.  Your physician has requested that you have a lexiscan myoview. For further information please visit [email or web address]. Please follow instruction sheet, as given.  Your physician has requested that you have a lower extremity arterial duplex. This test is an ultrasound of the arteries in the legs. It looks at arterial blood flow in the legs. Allow one hour for Lower Arterial scans. There are no restrictions or special instructions   Follow-Up:  Your physician recommends that you schedule a follow-up appointment in TWO WEEKS with Diane [facility] (pharmacist) for a lipid clinic appointment   Your physician recommends that you schedule a follow-up appointment with Diane [facility] after your testing.     Any Other Special Instructions Will Be Listed Below (If Applicable).

## 2015-07-31 NOTE — Progress Notes (Signed)
OFFICE NOTE  Chief Complaint:  Evaluate high cholesterol  Primary Care Physician: Jeanine Luz, FNP  HPI:  Diane Richmond is a 70 y.o. female who is currently referred to me by Charlann Noss, FNP, for evaluation of elevated cholesterol. I reviewed several years of cholesterol data as of 2013. Currently her total cholesterol is 302 which compares to a total cholesterol 266 3 years ago. Her triglycerides are 167, HDL 31, LDL 237, and non-HDL cholesterol of 270. She had a direct LDL over 200 several years ago. In addition she has type 2 diabetes, hypertension, asthma, and an abnormal EKG demonstrating left bundle branch block. She denies any chest pain or worsening shortness of breath but has had some worsening fatigue and decreased exercise tolerance, particularly over the last 6 months. Her family history is significant for mother who died of massive heart attack at age 72 and father who had a stroke and died at age 64. She also had a sister who died of stroke. In the past she had been on Lipitor but had significant myalgias with this and was switched to Crestor 20 mg daily. She said this also bothered her and lower dose was recommended. Blood pressure is up today at 162/86 but she reports being nervous. She's never been a smoker and denies alcohol use. Her sleepiness score was 0. She mentions that she does get pain in her legs when walking certain distances that gets better when she rests.  PMHx:  Past Medical History  Diagnosis Date  . Hypertension   . Hyperlipidemia   . Diabetes mellitus     type II  . HX: breast cancer   . Asthma   . Bronchitis     Past Surgical History  Procedure Laterality Date  . Breast lumpectomy  1994    left  . Tonsillectomy    . Right port-a-cath  1994    FAMHx:  Family History  Problem Relation Age of Onset  . Cancer Neg Hx   . Breast cancer    . Diabetes    . Hypertension    . Heart disease Mother     CAD/MI  . Aneurysm Father   . Stroke  Sister   . Hypertension Sister     SOCHx:   reports that she has never smoked. She quit smokeless tobacco use about 52 years ago. She reports that she does not drink alcohol or use illicit drugs.  ALLERGIES:  Allergies  Allergen Reactions  . Atorvastatin     REACTION: legs ache  . Promethazine-Codeine [Promethazine-Codeine] Nausea And Vomiting    Pt states cough syrup she can't take    ROS: Pertinent items noted in HPI and remainder of comprehensive ROS otherwise negative.  HOME MEDS: Current Outpatient Prescriptions  Medication Sig Dispense Refill  . albuterol (PROVENTIL HFA;VENTOLIN HFA) 108 (90 BASE) MCG/ACT inhaler Inhale 2 puffs into the lungs every 6 (six) hours as needed for wheezing or shortness of breath. 1 Inhaler 0  . amLODipine (NORVASC) 5 MG tablet TAKE 1 TABLET BY MOUTH EVERY DAY 90 tablet 3  . fluticasone (FLONASE) 50 MCG/ACT nasal spray Place 2 sprays into both nostrils daily. 16 g 6  . glucose blood test strip Accu-chek Smart View Test strips check blood sugar daily 100 each 12  . metFORMIN (GLUCOPHAGE) 500 MG tablet TAKE 1 TABLET BY MOUTH TWICE A DAY 60 tablet 2  . SYMBICORT 160-4.5 MCG/ACT inhaler INHALE 2 PUFFS INTO THE LUNGS 2 (TWO) TIMES DAILY. 10.2 g  11  . rosuvastatin (CRESTOR) 10 MG tablet Take 1 tablet (10 mg total) by mouth daily. 90 tablet 3   No current facility-administered medications for this visit.    LABS/IMAGING: No results found for this or any previous visit (from the past 48 hour(s)). No results found.  WEIGHTS: Wt Readings from Last 3 Encounters:  07/31/15 119 lb 12.8 oz (54.341 kg)  06/11/15 118 lb (53.524 kg)  11/14/14 116 lb (52.617 kg)    VITALS: BP 162/86 mmHg  Pulse 79  Ht 5\' 2"  (1.575 m)  Wt 119 lb 12.8 oz (54.341 kg)  BMI 21.91 kg/m2  EXAM: General appearance: alert, appears older than stated age, no distress and thin Neck: no JVD and Bilateral carotid bruits, left greater than right, no evidence for arcus  senilis Lungs: clear to auscultation bilaterally Heart: regular rate and rhythm, S1, S2 normal and systolic murmur: early systolic 3/6, blowing at apex Abdomen: soft, non-tender; bowel sounds normal; no masses,  no organomegaly Extremities: extremities normal, atraumatic, no cyanosis or edema and no varicosities Pulses: Diminished bilateral DP/PT pulses Skin: Cool, dry Neurologic: Grossly normal Psych: Pleasant  EKG: Normal sinus rhythm at 79, left bundle branch block at 134  ASSESSMENT: 1. Severely elevated cholesterol-likely FH (familial hypercholesterolemia) 2. Statin intolerance 3. Bilateral carotid bruits concerning for carotid stenosis 4. Claudication concerning for PAD 5. Type 2 diabetes 6. Hypertension 7. LBBB 8. Progressive fatigue with exertion 9. Systolic murmur  PLAN: 1.   Mrs. Thamas JaegersLennon has a number of findings which are concerning. Her cholesterol is markedly elevated consistent with FH. She's been intolerant to Lipitor was changed to Crestor but cannot tolerate the 20 mg dose. I've asked her to take a lower dose of 10 mg daily. She should also take coenzyme Q10 100 mg daily. Atherosclerotic vascular disease is noted with bilateral carotid bruits concerning for stenosis. I recommend carotid Dopplers as well as lower extremity arterial Dopplers. She has symptoms of leg pain with walking that gets better at rest. He also has type 2 diabetes and hypertension with elevated blood pressure. Her EKG shows left bundle branch block. Based on her progressive fatigue with exertion I like to repeat a nuclear stress test. At also obtain an echocardiogram to further evaluate her systolic murmur. Last echo in 2014 showed an EF of 60-65% with borderline pulmonary hypertension, mild PI and trivial MR. We'll check a CK and compresses a metabolic profile in one week after starting statin therapy. I will also refer her to Belenda CruiseKristin, our pharmacist for evaluation to start a PCSK9 inhibitor  Repatha.  Thanks for the kind referral.  Chrystie NoseKenneth C. Hilty, MD, [facility] Center[facility]ACC Attending Cardiologist [facility]  Chrystie NoseKenneth C Hilty 07/31/2015, 3:38 PM

## 2015-08-03 ENCOUNTER — Encounter: Payer: Self-pay | Admitting: *Deleted

## 2015-08-06 ENCOUNTER — Other Ambulatory Visit: Payer: Self-pay | Admitting: Internal Medicine

## 2015-08-06 DIAGNOSIS — I739 Peripheral vascular disease, unspecified: Secondary | ICD-10-CM

## 2015-08-10 ENCOUNTER — Other Ambulatory Visit: Payer: Self-pay | Admitting: Family Medicine

## 2015-08-11 ENCOUNTER — Ambulatory Visit (INDEPENDENT_AMBULATORY_CARE_PROVIDER_SITE_OTHER): Payer: Medicare Other | Admitting: Pharmacist Clinician (PhC)/ Clinical Pharmacy Specialist

## 2015-08-11 VITALS — Ht 62.0 in | Wt 118.0 lb

## 2015-08-11 DIAGNOSIS — E7801 Familial hypercholesterolemia: Secondary | ICD-10-CM | POA: Diagnosis not present

## 2015-08-11 NOTE — Patient Instructions (Signed)
Continue with Crestor 10 mg daily.  Add ezetimide 10 mg once daily also.  If this is cost prohibitive please call.  Will repeat labs in 2 months.  Cholesterol Cholesterol is a fat. Your body needs a small amount of cholesterol. Cholesterol may build up in your blood vessels. This increases your chance of having a heart attack or stroke. You cannot feel your cholesterol levels. The only way to know your cholesterol level is high is with a blood test. Keep your test results. Work with your doctor to keep your cholesterol at a good level. WHAT DO THE TEST RESULTS MEAN?  Total cholesterol is how much cholesterol is in your blood.  LDL is bad cholesterol. This is the type that can build up. You want LDL to be low.  HDL is good cholesterol. It cleans your blood vessels and carries LDL away. You want HDL to be high.  Triglycerides are fat that the body can burn for energy or store. WHAT ARE GOOD LEVELS OF CHOLESTEROL?  Total cholesterol below 200.  LDL below 100 for people at risk. Below 70 for those at very high risk.  HDL above 50 is good. Above 60 is best.  Triglycerides below 150. HOW CAN I LOWER MY CHOLESTEROL?  Diet. Follow your diet programs as told by your doctor.  Choose fish, white meat chicken, roasted [place], or baked [place]. Try not to eat red meat, fried foods, or processed meats such as sausage and lunch meats.  Eat lots of fresh fruits and vegetables.  Choose whole grains, beans, pasta, potatoes, and cereals.  Use only small amounts of olive, corn, or canola oils.  Try not to eat butter, mayonnaise, shortening, or palm kernel oils.  Try not to eat foods with trans fats.  Drink skim or nonfat milk. Eat low-fat or nonfat yogurt and cheeses. Try not to drink whole milk or cream. Try not to eat ice cream, egg yolks, and full-fat cheeses.  Healthy desserts include angel food cake, ginger snaps, animal crackers, hard candy, popsicles, and low-fat or nonfat frozen yogurt.  Try not to eat pastries, cakes, pies, and cookies.  Exercise. Follow your exercise programs as told by your doctor.  Be more active. You can try gardening, walking, or taking the stairs. Ask your doctor about how you can be more active.  Medicine. Take medicine as told by your doctor.   This information is not intended to replace advice given to you by your health care provider. Make sure you discuss any questions you have with your health care provider.   Document Released: 07/08/2008 Document Revised: 05/02/2014 Document Reviewed: 01/23/2013 Elsevier Interactive Patient Education [employer]2016 Elsevier Inc.

## 2015-08-12 ENCOUNTER — Encounter: Payer: Self-pay | Admitting: Pharmacist Clinician (PhC)/ Clinical Pharmacy Specialist

## 2015-08-12 MED ORDER — EZETIMIBE 10 MG PO TABS: 10.0000 mg | ORAL_TABLET | Freq: Every day | ORAL | Status: DC

## 2015-08-12 NOTE — Progress Notes (Signed)
08/12/2015 Diane Richmond Aug 30, 1945 [id number]   HPI:  Diane Richmond is a 70 y.o. female patient of Dr Rennis GoldenHilty, who presents today for a lipid clinic evaluation.  She has a history of high LDL cholesterol, with our records showing a baseline of 245.9 back in November 2013.    RF: bilateral carotid bruits - patient scheduled for carotid and LE dopplers   Meds: rosuvastatin 10 mg daily   Intolerant/previously tried: atorvastatin 80 and 40 mg - myalgias; rosuvastatin 20 mg - myalgias; pravastatin 40 mg - myalgias  Family history: father died from stroke at age 70, mother from MI at 2863, sister from stroke in late 7140's  Diet:  Oatmeal or grits most mornings; chicken or fish and frozen vegetables most nights; occasional pasta or potatoes; admits to eating breads regularly  Exercise: no regular exercise, does keep up with house and yard work    Labs:  Results for Diane Richmond, Diane Richmond (MRN [id number]) as of 08/12/2015 13:50  Ref. Range 11/14/2014 12:02 06/11/2015 14:28  Cholesterol Latest Ref Range: 0-200 mg/dL 782283 (H) 956302 (H)  Triglycerides Latest Ref Range: 0.0-149.0 mg/dL 213.0132.0 [phone] (H)  HDL Cholesterol Latest Ref Range: >39.00 mg/dL 84.6930.60 (L) 62.9531.00 (L)  LDL (calc) Latest Ref Range: 0-99 mg/dL 284226 (H) 132237 (H)  VLDL Latest Ref Range: 0.0-40.0 mg/dL 44.026.4 10.233.4  Total CHOL/HDL Ratio Unknown 9 10  NonHDL Unknown 252.40 270.65     Current Outpatient Prescriptions  Medication Sig Dispense Refill  . albuterol (PROVENTIL HFA;VENTOLIN HFA) 108 (90 BASE) MCG/ACT inhaler Inhale 2 puffs into the lungs every 6 (six) hours as needed for wheezing or shortness of breath. 1 Inhaler 0  . amLODipine (NORVASC) 5 MG tablet TAKE 1 TABLET BY MOUTH EVERY DAY 90 tablet 3  . ezetimibe (ZETIA) 10 MG tablet Take 1 tablet (10 mg total) by mouth daily. 30 tablet 5  . fluticasone (FLONASE) 50 MCG/ACT nasal spray Place 2 sprays into both nostrils daily. 16 g 6  . glucose blood test strip Accu-chek Smart View Test strips  check blood sugar daily 100 each 12  . metFORMIN (GLUCOPHAGE) 500 MG tablet TAKE 1 TABLET BY MOUTH TWICE A DAY 60 tablet 2  . rosuvastatin (CRESTOR) 10 MG tablet Take 1 tablet (10 mg total) by mouth daily. 90 tablet 3  . SYMBICORT 160-4.5 MCG/ACT inhaler INHALE 2 PUFFS INTO THE LUNGS 2 (TWO) TIMES DAILY. 10.2 g 11   No current facility-administered medications for this visit.    Allergies  Allergen Reactions  . Atorvastatin     REACTION: legs ache  . Promethazine-Codeine [Promethazine-Codeine] Nausea And Vomiting    Pt states cough syrup she can't take    Past Medical History  Diagnosis Date  . Hypertension   . Hyperlipidemia   . Diabetes mellitus     type II  . HX: breast cancer   . Asthma   . Bronchitis     There were no vitals taken for this visit.   ASSESSMENT AND PLAN:  Phillips HayKristin Dene Landsberg PharmD CPP [facility]

## 2015-08-12 NOTE — Assessment & Plan Note (Signed)
Patient with strong history of familial hypercholesterolemia.  She currently tolerates rosuvastatin 10 mg without incident.  To qualify for PCSK-9 as familial, she would need a [place] Score of 6 or greater.  Unfortunately her LDL cholesterol is just below the 250 mark, leaving her with a [place] score of 4 (3 for LDL of 245.9 and 1 for family with premature coronary disease).  She has no evidence of ASCVD, although she is to have both carotid and lower extremity dopplers next week.  If either show evidence of disease will start process to get PCSK-9 inhibitor covered by her insurance.  In the meantime, will have her start ezetimibe 10 mg daily in addition to her rosuvastatin 10 mg.  She was shown both Repatha systems, and would prefer the monthly infusion.

## 2015-08-21 ENCOUNTER — Ambulatory Visit (HOSPITAL_COMMUNITY)
Admission: RE | Admit: 2015-08-21 | Discharge: 2015-08-21 | Disposition: A | Discharge status: Home / Self Care | Payer: Medicare Other | Source: Ambulatory Visit | Attending: Cardiovascular Disease | Admitting: Cardiovascular Disease

## 2015-08-21 DIAGNOSIS — I739 Peripheral vascular disease, unspecified: Secondary | ICD-10-CM

## 2015-08-21 DIAGNOSIS — E119 Type 2 diabetes mellitus without complications: Secondary | ICD-10-CM | POA: Insufficient documentation

## 2015-08-21 DIAGNOSIS — R0989 Other specified symptoms and signs involving the circulatory and respiratory systems: Secondary | ICD-10-CM | POA: Diagnosis not present

## 2015-08-21 DIAGNOSIS — I700 Atherosclerosis of aorta: Secondary | ICD-10-CM | POA: Insufficient documentation

## 2015-08-21 DIAGNOSIS — E785 Hyperlipidemia, unspecified: Secondary | ICD-10-CM | POA: Insufficient documentation

## 2015-08-21 DIAGNOSIS — I70203 Unspecified atherosclerosis of native arteries of extremities, bilateral legs: Secondary | ICD-10-CM | POA: Insufficient documentation

## 2015-08-21 DIAGNOSIS — I6523 Occlusion and stenosis of bilateral carotid arteries: Secondary | ICD-10-CM | POA: Insufficient documentation

## 2015-08-21 DIAGNOSIS — I10 Essential (primary) hypertension: Secondary | ICD-10-CM | POA: Diagnosis not present

## 2015-08-25 ENCOUNTER — Telehealth: Payer: Self-pay | Admitting: Internal Medicine

## 2015-08-25 NOTE — Telephone Encounter (Signed)
Closed encounter °

## 2015-08-25 NOTE — Telephone Encounter (Signed)
Spoke to patient's DAUGHTER RENA. CAROTID DOPPLER Result given . Verbalized understanding

## 2015-08-25 NOTE — Telephone Encounter (Signed)
New message   Pt daughter called requesting that her mother received a call and she is returning call about her test last week

## 2015-08-26 ENCOUNTER — Telehealth (HOSPITAL_COMMUNITY): Payer: Self-pay

## 2015-08-26 NOTE — Telephone Encounter (Signed)
Encounter complete. 

## 2015-08-27 ENCOUNTER — Encounter: Payer: Medicare Other | Admitting: Cardiovascular Disease

## 2015-08-28 ENCOUNTER — Ambulatory Visit (HOSPITAL_BASED_OUTPATIENT_CLINIC_OR_DEPARTMENT_OTHER)
Admission: RE | Admit: 2015-08-28 | Discharge: 2015-08-28 | Disposition: A | Discharge status: Home / Self Care | Payer: Medicare Other | Source: Ambulatory Visit | Attending: Cardiovascular Disease | Admitting: Cardiovascular Disease

## 2015-08-28 ENCOUNTER — Ambulatory Visit (HOSPITAL_COMMUNITY)
Admission: RE | Admit: 2015-08-28 | Discharge: 2015-08-28 | Disposition: A | Discharge status: Home / Self Care | Payer: Medicare Other | Source: Ambulatory Visit | Attending: Cardiovascular Disease | Admitting: Cardiovascular Disease

## 2015-08-28 DIAGNOSIS — R011 Cardiac murmur, unspecified: Secondary | ICD-10-CM

## 2015-08-28 DIAGNOSIS — R0989 Other specified symptoms and signs involving the circulatory and respiratory systems: Secondary | ICD-10-CM | POA: Insufficient documentation

## 2015-08-28 DIAGNOSIS — E119 Type 2 diabetes mellitus without complications: Secondary | ICD-10-CM | POA: Diagnosis not present

## 2015-08-28 DIAGNOSIS — R5383 Other fatigue: Secondary | ICD-10-CM | POA: Diagnosis not present

## 2015-08-28 DIAGNOSIS — I10 Essential (primary) hypertension: Secondary | ICD-10-CM | POA: Diagnosis not present

## 2015-08-28 DIAGNOSIS — I447 Left bundle-branch block, unspecified: Secondary | ICD-10-CM

## 2015-08-28 DIAGNOSIS — R0609 Other forms of dyspnea: Secondary | ICD-10-CM | POA: Insufficient documentation

## 2015-08-28 DIAGNOSIS — R079 Chest pain, unspecified: Secondary | ICD-10-CM | POA: Insufficient documentation

## 2015-08-28 DIAGNOSIS — I779 Disorder of arteries and arterioles, unspecified: Secondary | ICD-10-CM | POA: Insufficient documentation

## 2015-08-28 DIAGNOSIS — R42 Dizziness and giddiness: Secondary | ICD-10-CM | POA: Diagnosis not present

## 2015-08-28 DIAGNOSIS — E7801 Familial hypercholesterolemia: Secondary | ICD-10-CM

## 2015-08-28 DIAGNOSIS — Z8249 Family history of ischemic heart disease and other diseases of the circulatory system: Secondary | ICD-10-CM | POA: Diagnosis not present

## 2015-08-28 LAB — MYOCARDIAL PERFUSION IMAGING
CHL CUP NUCLEAR SDS: 0
CHL CUP NUCLEAR SRS: 3
CSEPPHR: 131 {beats}/min
LV sys vol: 23 mL
LVDIAVOL: 65 mL (ref 46–106)
Rest HR: 94 {beats}/min
SSS: 3
TID: 1.22

## 2015-08-28 MED ORDER — REGADENOSON 0.4 MG/5ML IV SOLN
0.4000 mg | Freq: Once | INTRAVENOUS | Status: AC
  Administered 2015-08-28: 0.4 mg via INTRAVENOUS

## 2015-08-28 MED ORDER — TECHNETIUM TC 99M SESTAMIBI GENERIC - CARDIOLITE
30.5000 | Freq: Once | INTRAVENOUS | Status: AC | PRN
  Administered 2015-08-28: 30.5 via INTRAVENOUS

## 2015-08-28 MED ORDER — TECHNETIUM TC 99M SESTAMIBI GENERIC - CARDIOLITE
10.3000 | Freq: Once | INTRAVENOUS | Status: AC | PRN
  Administered 2015-08-28: 10.3 via INTRAVENOUS

## 2015-09-07 ENCOUNTER — Encounter: Payer: Self-pay | Admitting: Internal Medicine

## 2015-09-08 ENCOUNTER — Other Ambulatory Visit: Payer: Self-pay | Admitting: Family Medicine

## 2015-09-08 NOTE — Telephone Encounter (Signed)
Rx denied-pt needs office visit have not been seen since (10-17-13).//AB/CMA

## 2015-09-24 ENCOUNTER — Ambulatory Visit: Payer: Medicare Other | Admitting: Internal Medicine

## 2015-10-16 ENCOUNTER — Other Ambulatory Visit: Payer: Self-pay

## 2015-10-16 MED ORDER — METFORMIN HCL 500 MG PO TABS: 500.0000 mg | ORAL_TABLET | Freq: Two times a day (BID) | ORAL | Status: DC

## 2015-11-15 ENCOUNTER — Other Ambulatory Visit: Payer: Self-pay | Admitting: Family

## 2015-11-15 DIAGNOSIS — I10 Essential (primary) hypertension: Secondary | ICD-10-CM

## 2015-12-23 ENCOUNTER — Other Ambulatory Visit: Payer: Self-pay

## 2015-12-23 DIAGNOSIS — I10 Essential (primary) hypertension: Secondary | ICD-10-CM

## 2015-12-23 MED ORDER — AMLODIPINE BESYLATE 5 MG PO TABS: ORAL_TABLET | ORAL | 3 refills | Status: DC

## 2015-12-29 ENCOUNTER — Other Ambulatory Visit: Payer: Self-pay | Admitting: Pulmonary Disease

## 2015-12-29 DIAGNOSIS — Z1231 Encounter for screening mammogram for malignant neoplasm of breast: Secondary | ICD-10-CM

## 2016-01-14 ENCOUNTER — Ambulatory Visit
Admission: RE | Admit: 2016-01-14 | Discharge: 2016-01-14 | Disposition: A | Discharge status: Home / Self Care | Payer: Medicare Other | Source: Ambulatory Visit | Attending: Pulmonary Disease | Admitting: Pulmonary Disease

## 2016-01-14 DIAGNOSIS — Z1231 Encounter for screening mammogram for malignant neoplasm of breast: Secondary | ICD-10-CM

## 2016-01-29 ENCOUNTER — Ambulatory Visit (INDEPENDENT_AMBULATORY_CARE_PROVIDER_SITE_OTHER): Payer: Medicare Other | Admitting: Internal Medicine

## 2016-01-29 ENCOUNTER — Encounter: Payer: Self-pay | Admitting: Internal Medicine

## 2016-01-29 VITALS — BP 162/72 | HR 97 | Ht 62.0 in | Wt 115.4 lb

## 2016-01-29 DIAGNOSIS — I739 Peripheral vascular disease, unspecified: Secondary | ICD-10-CM | POA: Diagnosis not present

## 2016-01-29 DIAGNOSIS — I10 Essential (primary) hypertension: Secondary | ICD-10-CM

## 2016-01-29 DIAGNOSIS — I7090 Unspecified atherosclerosis: Secondary | ICD-10-CM | POA: Diagnosis not present

## 2016-01-29 DIAGNOSIS — E7801 Familial hypercholesterolemia: Secondary | ICD-10-CM

## 2016-01-29 DIAGNOSIS — E119 Type 2 diabetes mellitus without complications: Secondary | ICD-10-CM | POA: Diagnosis not present

## 2016-01-29 DIAGNOSIS — I447 Left bundle-branch block, unspecified: Secondary | ICD-10-CM

## 2016-01-29 NOTE — Progress Notes (Signed)
OFFICE NOTE  Chief Complaint:  Follow-up studies  Primary Care Physician: Jeanine Luz, FNP  HPI:  MEEKA CARTELLI is a 70 y.o. female who is currently referred to me by Charlann Noss, FNP, for evaluation of elevated cholesterol. I reviewed several years of cholesterol data as of 2013. Currently her total cholesterol is 302 which compares to a total cholesterol 266 3 years ago. Her triglycerides are 167, HDL 31, LDL 237, and non-HDL cholesterol of 270. She had a direct LDL over 200 several years ago. In addition she has type 2 diabetes, hypertension, asthma, and an abnormal EKG demonstrating left bundle branch block. She denies any chest pain or worsening shortness of breath but has had some worsening fatigue and decreased exercise tolerance, particularly over the last 6 months. Her family history is significant for mother who died of massive heart attack at age 39 and father who had a stroke and died at age 23. She also had a sister who died of stroke. In the past she had been on Lipitor but had significant myalgias with this and was switched to Crestor 20 mg daily. She said this also bothered her and lower dose was recommended. Blood pressure is up today at 162/86 but she reports being nervous. She's never been a smoker and denies alcohol use. Her sleepiness score was 0. She mentions that she does get pain in her legs when walking certain distances that gets better when she rests.  01/29/2016  [name] returns today for follow-up. Unfortunately she did not make routine follow-up and we were unable to contact her for several months. We did relay the results of her extensive testing by mail. She was scheduled for follow-up appointment which she did not make and a referral to my partner which she canceled. She returns today continues to have symptoms concerning for claudication. She underwent a nuclear stress test which was negative for ischemia and demonstrated normal LV function with an EF of 64%.  Echocardiogram also showed an EF of 60-65%, grade 1 diastolic dysfunction, mild TR and PI with an RVSP of 31 mmHg. She had carotid Dopplers which showed mild bilateral carotid artery stenosis. Of note, she did have lower extremity arterial Dopplers which showed moderate to severe bilateral lower extremity arterial insufficiency. ABIs were 0.24 and the right and 0.4 on the left. Based on these findings I referred her to Dr. Allyson Sabal for evaluation and possible angiogram however she did not make this appointment. I also recommended additional lab work including an extended lipid profile as she has been intolerant to statins. We had to decrease her Crestor to 10 mg daily which she seems to be tolerating along with ezetimibe. She was being considered for Repatha and this will need to be reinitiated. 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  PMHx:  Past Medical History:  Diagnosis Date  . Asthma   . Bronchitis   . Diabetes mellitus    type II  . HX: breast cancer   . Hyperlipidemia   . Hypertension     Past Surgical History:  Procedure Laterality Date  . BREAST LUMPECTOMY  1994   left  . right port-a-cath  1994  . TONSILLECTOMY      FAMHx:  Family History  Problem Relation Age of Onset  . Heart disease Mother     CAD/MI  . Aneurysm Father     CVA  . Stroke Sister   . Hypertension Sister   . Breast cancer    . Diabetes    .  Hypertension    . Cancer Brother   . Cancer Brother     SOCHx:   reports that she has never smoked. She quit smokeless tobacco use about 52 years ago. She reports that she does not drink alcohol or use drugs.  ALLERGIES:  Allergies  Allergen Reactions  . Atorvastatin     REACTION: legs ache  . Promethazine-Codeine [Promethazine-Codeine] Nausea And Vomiting    Pt states cough syrup she can't take    ROS: Pertinent items noted in HPI and remainder of comprehensive ROS otherwise negative.  HOME MEDS: Current Outpatient Prescriptions  Medication  Sig Dispense Refill  . albuterol (PROVENTIL HFA;VENTOLIN HFA) 108 (90 BASE) MCG/ACT inhaler Inhale 2 puffs into the lungs every 6 (six) hours as needed for wheezing or shortness of breath. 1 Inhaler 0  . amLODipine (NORVASC) 5 MG tablet TAKE 1 TABLET BY MOUTH EVERY DAY 90 tablet 3  . Coenzyme Q10 (COQ10) 50 MG CAPS Take by mouth.    . ezetimibe (ZETIA) 10 MG tablet Take 1 tablet (10 mg total) by mouth daily. 30 tablet 5  . fluticasone (FLONASE) 50 MCG/ACT nasal spray Place 2 sprays into both nostrils daily. 16 g 6  . glucose blood test strip Accu-chek Smart View Test strips check blood sugar daily 100 each 12  . metFORMIN (GLUCOPHAGE) 500 MG tablet Take 1 tablet (500 mg total) by mouth 2 (two) times daily. 60 tablet 2  . rosuvastatin (CRESTOR) 10 MG tablet Take 1 tablet (10 mg total) by mouth daily. 90 tablet 3  . SYMBICORT 160-4.5 MCG/ACT inhaler INHALE 2 PUFFS INTO THE LUNGS 2 (TWO) TIMES DAILY. 10.2 g 11   No current facility-administered medications for this visit.     LABS/IMAGING: No results found for this or any previous visit (from the past 48 hour(s)). No results found.  WEIGHTS: Wt Readings from Last 3 Encounters:  01/29/16 115 lb 6.4 oz (52.3 kg)  08/28/15 119 lb (54 kg)  08/12/15 118 lb (53.5 kg)    VITALS: BP (!) 162/72   Pulse 97   Ht 5\' 2"  (1.575 m)   Wt 115 lb 6.4 oz (52.3 kg)   BMI 21.11 kg/m   EXAM: Deferred  EKG: Deferred  ASSESSMENT: 1. Severely elevated cholesterol-likely FH (familial hypercholesterolemia) 2. Statin intolerance 3. Bilateral carotid bruits- mild bilateral carotid stenosis (07/2015) 4. Claudication concerning for PAD - moderate to severe PAD (07/2015) 5. Type 2 diabetes 6. Hypertension 7. LBBB 8. Progressive fatigue with exertion 9. Systolic murmur  PLAN: 1.   Mrs. Thamas JaegersLennon has significant claudication unfortunately had a low risk Myoview and mildly abnormal echo with normal LV function. She has mild bilateral carotid artery stenosis  although she does have bruits. She is now interested in evaluation of her PAD and I'll refer her to Dr. Allyson SabalBerry for further evaluation. She will also need aggressive lipid management. It does not appear that she'll tolerate a Crestor dose greater than 10 mg and she has previously failed atorvastatin. She is also on ezetimibe. Her LDL on therapy was 237. We will repeat a lipid NMR as well as CK and CMET and refer her back to the lipid clinic for re-evaluation of Repatha therapy.  Follow-up with me in 3 months.  Chrystie NoseKenneth C. Laray Rivkin, MD, [facility] Attending Cardiologist [facility]  Chrystie NoseKenneth C Fergie Sherbert 01/29/2016, 3:17 PM

## 2016-01-29 NOTE — Patient Instructions (Signed)
Medication Instructions:  Continue current medications  Labwork: None Ordered  Testing/Procedures: None Ordered  Follow-Up: Your physician recommends that you schedule a follow-up appointment in: [facility]  Your physician recommends that you schedule a follow-up appointment in: with [facility]  Your physician recommends that you schedule a follow-up appointment in: 3 Months with Dr Rennis GoldenHilty   Any Other Special Instructions Will Be Listed Below (If Applicable).     If you need a refill on your cardiac medications before your next appointment, please call your pharmacy.

## 2016-02-04 DIAGNOSIS — Z79899 Other long term (current) drug therapy: Secondary | ICD-10-CM | POA: Diagnosis not present

## 2016-02-05 LAB — COMPREHENSIVE METABOLIC PANEL
ALBUMIN: 4.1 g/dL (ref 3.6–5.1)
ALK PHOS: 59 U/L (ref 33–130)
ALT: 15 U/L (ref 6–29)
AST: 22 U/L (ref 10–35)
BUN: 14 mg/dL (ref 7–25)
CALCIUM: 9.4 mg/dL (ref 8.6–10.4)
CO2: 26 mmol/L (ref 20–31)
Chloride: 101 mmol/L (ref 98–110)
Creat: 0.61 mg/dL (ref 0.50–0.99)
Glucose, Bld: 76 mg/dL (ref 65–99)
POTASSIUM: 3.7 mmol/L (ref 3.5–5.3)
Sodium: 138 mmol/L (ref 135–146)
TOTAL PROTEIN: 7.6 g/dL (ref 6.1–8.1)
Total Bilirubin: 0.2 mg/dL (ref 0.2–1.2)

## 2016-02-05 LAB — CK: CK TOTAL: 66 U/L (ref 7–177)

## 2016-02-08 LAB — CARDIO IQ(R) ADVANCED LIPID PANEL
APOLIPOPROTEIN (CARDIO IQ ADV LIPID PANEL): 102 mg/dL (ref 49–103)
CHOLESTEROL/HDL RATIO (CARDIO IQ ADV LIPID PANEL): 5.2 calc — AB (ref <5.0)
Cholesterol, Total: 155 mg/dL (ref <200)
HDL CHOLESTEROL (CARDIO IQ ADV LIPID PANEL): 30 mg/dL — AB (ref >50)
LDL CHOLESTEROL CALCULATED (CARDIO IQ ADV LIPID PANEL): 103 mg/dL — AB (ref <100)
LDL Large: 5550 nmol/L (ref 5038–17886)
LDL Medium: 168 nmol/L (ref 121–397)
LDL Particle Number: 1338 nmol/L (ref 1016–2185)
LDL Peak Size: 205.7 Angstrom — ABNORMAL LOW (ref >=218.2)
LDL SMALL: 223 nmol/L (ref 115–386)
LIPOPROTEIN (A) (CARDIO IQ ADV LIPID PANEL): 221 nmol/L — AB (ref <75)
NON-HDL CHOLESTEROL (CARDIO IQ ADV LIPID PANEL): 125 mg/dL (ref <130)
TRIGLYCERIDES (CARDIO IQ ADV LIPID PANEL): 128 mg/dL (ref <150)

## 2016-02-09 ENCOUNTER — Encounter: Payer: Self-pay | Admitting: Cardiovascular Disease

## 2016-02-09 ENCOUNTER — Ambulatory Visit (INDEPENDENT_AMBULATORY_CARE_PROVIDER_SITE_OTHER): Payer: Medicare Other | Admitting: Cardiovascular Disease

## 2016-02-09 VITALS — BP 178/78 | HR 85 | Ht 62.0 in | Wt 119.0 lb

## 2016-02-09 DIAGNOSIS — I739 Peripheral vascular disease, unspecified: Secondary | ICD-10-CM

## 2016-02-09 DIAGNOSIS — I10 Essential (primary) hypertension: Secondary | ICD-10-CM | POA: Diagnosis not present

## 2016-02-09 NOTE — Progress Notes (Signed)
02/09/2016 Diane Richmond   Mar 22, 1946  [id number]  Primary Physician Jeanine Luzalone, Gregory, FNP Primary Cardiologist: Runell GessJonathan J Berry MD Roseanne RenoFACP, FACC, FAHA, FSCAI  HPI:   Diane Richmond is a 70 year old married African-American female mother of 4, the mother for grandchildren copy by her daughter Diane Richmond today. She is a patient of Dr. Blanchie DessertHilty's. She is initiated to hypertension, diabetes and hyperlipidemia. She has never had a heart attack or stroke. She does have mild carotid disease. She has lifestyle limiting claudication for last 2 years right greater than left. Lower extremity arterial Doppler studies performed office 4//28/17 revealed a right ABI 0.5, a left ABI of 0.6 with moderate distal common femoral and proximal SFA disease bilaterally.   Current Outpatient Prescriptions  Medication Sig Dispense Refill  . albuterol (PROVENTIL HFA;VENTOLIN HFA) 108 (90 BASE) MCG/ACT inhaler Inhale 2 puffs into the lungs every 6 (six) hours as needed for wheezing or shortness of breath. 1 Inhaler 0  . amLODipine (NORVASC) 5 MG tablet TAKE 1 TABLET BY MOUTH EVERY DAY 90 tablet 3  . Coenzyme Q10 (COQ10) 50 MG CAPS Take by mouth.    . ezetimibe (ZETIA) 10 MG tablet Take 1 tablet (10 mg total) by mouth daily. 30 tablet 5  . fluticasone (FLONASE) 50 MCG/ACT nasal spray Place 2 sprays into both nostrils daily. 16 g 6  . glucose blood test strip Accu-chek Smart View Test strips check blood sugar daily 100 each 12  . metFORMIN (GLUCOPHAGE) 500 MG tablet Take 1 tablet (500 mg total) by mouth 2 (two) times daily. 60 tablet 2  . rosuvastatin (CRESTOR) 10 MG tablet Take 1 tablet (10 mg total) by mouth daily. 90 tablet 3  . SYMBICORT 160-4.5 MCG/ACT inhaler INHALE 2 PUFFS INTO THE LUNGS 2 (TWO) TIMES DAILY. 10.2 g 11   No current facility-administered medications for this visit.     Allergies  Allergen Reactions  . Atorvastatin     REACTION: legs ache  . Promethazine-Codeine [Promethazine-Codeine] Nausea And  Vomiting    Pt states cough syrup she can't take    Social History   Social History  . Marital status: Married    Spouse name: N/A  . Number of children: 4  . Years of education: 8   Occupational History  . nurse assistant - retired    Social History Main Topics  . Smoking status: Never Smoker  . Smokeless tobacco: Former NeurosurgeonUser    Quit date: 03/02/1963  . Alcohol use No  . Drug use: No  . Sexual activity: Not Currently   Other Topics Concern  . Not on file   Social History Narrative   8th grade. Married '67 - .  4 dtrs - '67, '70, '72, '78. Grandchildren - 4. 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 Lives with husband.    Denies abuse and feels safe at home.      Review of Systems: General: negative for chills, fever, night sweats or weight changes.  Cardiovascular: negative for chest pain, dyspnea on exertion, edema, orthopnea, palpitations, paroxysmal nocturnal dyspnea or shortness of breath Dermatological: negative for rash Respiratory: negative for cough or wheezing Urologic: negative for hematuria Abdominal: negative for nausea, vomiting, diarrhea, bright red blood per rectum, melena, or hematemesis Neurologic: negative for visual changes, syncope, or dizziness All other systems reviewed and are otherwise negative except as noted above.    Blood pressure (!) 178/78, pulse 85, height 5\' 2"  (1.575 m), weight 119 lb (54 kg).  General appearance: alert  and no distress Neck: no adenopathy, no JVD, supple, symmetrical, trachea midline, thyroid not enlarged, symmetric, no tenderness/mass/nodules and Soft bruits bilaterally Lungs: clear to auscultation bilaterally Heart: regular rate and rhythm, S1, S2 normal, no murmur, click, rub or gallop Extremities: extremities normal, atraumatic, no cyanosis or edema  EKG sinus rhythm 85 with left bundle branch block. I personally reviewed his EKG  ASSESSMENT AND PLAN:   PAD (peripheral artery disease) (HCC) Miss Ostrosky was referred to me by  Dr. Rennis Golden for evaluation of lifestyle limiting claudication. He has risk factors include treated hypertension, diabetes and hyperlipidemia. She has had symmetric lifestyle limiting claudication for the last 2 years. She had Dopplers performed in our office/20/17 revealing a right ABI 0.5 for the left of 0.6. She did have moderate distal common femoral and proximal SFA disease bilaterally. I discussed angiography intervention with the patient and her daughter who was present during the interview and they decided to consider pursuing an invasive approach and will get back to [place] if they've noted that they decide to do that. Otherwise, I will see her back when necessary.      Runell Gess MD FACP,FACC,FAHA, [facility] 02/09/2016 3:34 PM

## 2016-02-09 NOTE — Assessment & Plan Note (Signed)
Diane Richmond was referred to me by Dr. Rennis GoldenHilty for evaluation of lifestyle limiting claudication. He has risk factors include treated hypertension, diabetes and hyperlipidemia. She has had symmetric lifestyle limiting claudication for the last 2 years. She had Dopplers performed in our office/20/17 revealing a right ABI 0.5 for the left of 0.6. She did have moderate distal common femoral and proximal SFA disease bilaterally. I discussed angiography intervention with the patient and her daughter who was present during the interview and they decided to consider pursuing an invasive approach and will get back to us if they've noted that they decide to do that. Otherwise, I will see her back when necessary.

## 2016-02-09 NOTE — Patient Instructions (Signed)
Medication Instructions:  Continue current medications.   Testing/Procedures: Your physician has requested that you have a lower extremity arterial doppler- During this test, ultrasound is used to evaluate arterial blood flow in the legs. Allow approximately one hour for this exam.    Follow-Up: Your physician recommends that you schedule a follow-up appointment in: AS NEEDED.  If you need a refill on your cardiac medications before your next appointment, please call your pharmacy.

## 2016-02-12 ENCOUNTER — Other Ambulatory Visit: Payer: Self-pay | Admitting: Cardiovascular Disease

## 2016-02-12 DIAGNOSIS — I739 Peripheral vascular disease, unspecified: Secondary | ICD-10-CM

## 2016-02-12 DIAGNOSIS — I10 Essential (primary) hypertension: Secondary | ICD-10-CM

## 2016-02-18 ENCOUNTER — Ambulatory Visit (INDEPENDENT_AMBULATORY_CARE_PROVIDER_SITE_OTHER): Payer: Medicare Other | Admitting: Pharmacist Clinician (PhC)/ Clinical Pharmacy Specialist

## 2016-02-18 DIAGNOSIS — E7801 Familial hypercholesterolemia: Secondary | ICD-10-CM | POA: Diagnosis not present

## 2016-02-18 NOTE — Progress Notes (Signed)
02/18/2016 Diane Richmond 03-Oct-1945 [id number]   HPI:  Diane Richmond is a 70 y.o. female patient of Dr Rennis GoldenHilty, who presents today for a lipid clinic evaluation.  She has a history of high LDL cholesterol, with our records showing a baseline of 245.9 back in November 2013.  She has been able to tolerate rosuvastatin 10 mg daily, but 20 mg daily did cause myalgias.  Ezetimibe 10 mg daily was added in April of this year, and her most recent labs show the combination has dropped her LDL almost 60% to 103.  Since I saw her last she has been diagnosed with peripheral arterial disease and is looking to potentially start a PCSK-9 inhibitor to further lower her cholesterol.    RF: PAD with right ABI 0.5 and left ABI 0.6   Meds: rosuvastatin 10 mg daily, ezetimibe 10 mg daily   Intolerant/previously tried: atorvastatin 80 and 40 mg - myalgias; rosuvastatin 20 mg - myalgias; pravastatin 40 mg - myalgias  Family history: father died from stroke at age 70, mother from MI at 2863, sister from stroke in late 5040's  Diet:  Oatmeal or grits most mornings; chicken or fish and frozen vegetables most nights; occasional pasta or potatoes; admits to eating breads regularly  Exercise: no regular exercise, does keep up with house and yard work  [place] Lipid Score: 4 (3 for LDL, 1 for family hx)    Labs:  Results for Diane Richmond, Diane Richmond (MRN [id number]) as of 08/12/2015 13:50  Ref. Range 11/14/2014 12:02 06/11/2015 14:28 02/04/16  Cholesterol Latest Ref Range: 0-200 mg/dL 782283 (H) 956302 (H) 213155  Triglycerides Latest Ref Range: 0.0-149.0 mg/dL 086.5132.0 784.6167.0 (H) 962128  HDL Cholesterol Latest Ref Range: >39.00 mg/dL 95.2830.60 (L) 41.3231.00 (L) 30  LDL (calc) Latest Ref Range: 0-99 mg/dL 440226 (H) 102237 (H) 725103  VLDL Latest Ref Range: 0.0-40.0 mg/dL 36.626.4 44.033.4   Total CHOL/HDL Ratio Unknown 9 10 5.2  NonHDL Unknown 252.40 270.65 347125     Current Outpatient Prescriptions  Medication Sig Dispense Refill  . albuterol (PROVENTIL HFA;VENTOLIN  HFA) 108 (90 BASE) MCG/ACT inhaler Inhale 2 puffs into the lungs every 6 (six) hours as needed for wheezing or shortness of breath. 1 Inhaler 0  . amLODipine (NORVASC) 5 MG tablet TAKE 1 TABLET BY MOUTH EVERY DAY 90 tablet 3  . Coenzyme Q10 (COQ10) 50 MG CAPS Take by mouth.    . ezetimibe (ZETIA) 10 MG tablet Take 1 tablet (10 mg total) by mouth daily. 30 tablet 5  . fluticasone (FLONASE) 50 MCG/ACT nasal spray Place 2 sprays into both nostrils daily. 16 g 6  . glucose blood test strip Accu-chek Smart View Test strips check blood sugar daily 100 each 12  . metFORMIN (GLUCOPHAGE) 500 MG tablet Take 1 tablet (500 mg total) by mouth 2 (two) times daily. 60 tablet 2  . rosuvastatin (CRESTOR) 10 MG tablet Take 1 tablet (10 mg total) by mouth daily. 90 tablet 3  . SYMBICORT 160-4.5 MCG/ACT inhaler INHALE 2 PUFFS INTO THE LUNGS 2 (TWO) TIMES DAILY. 10.2 g 11   No current facility-administered medications for this visit.     Allergies  Allergen Reactions  . Atorvastatin     REACTION: legs ache  . Promethazine-Codeine [Promethazine-Codeine] Nausea And Vomiting    Pt states cough syrup she can't take    Past Medical History:  Diagnosis Date  . Asthma   . Bronchitis   . Diabetes mellitus    type II  .  HX: breast cancer   . Hyperlipidemia   . Hypertension     There were no vitals taken for this visit.   ASSESSMENT AND PLAN:  Patient has already had an almost 60% decrease in LDL with rosuvastatin and ezetimibe (both 10 mg).  We discussed the option of Repatha, however because of Medicare insurance costs, this may prove to be cost prohibitive.  Patient assistance is available, but expires on Dec 31, so we are best to wait until January to apply for that.  In the meantime, we are going to increase her rosuvastatin to 20 mg each Monday and Friday, 10 mg all other days.  She will continue with ezetimibe.  If she can tolerate this we will repeat cholesterol labs in January then determine if Repatha  is still needed.  Patient agreeable to plan.  Phillips Hay PharmD CPP [facility]

## 2016-02-18 NOTE — Patient Instructions (Signed)
Medications:  Crestor (rosuvastatin) 10 mg (1/2 tablet)  each day EXCEPT 20 mg (1 tablet) each Monday and Friday.    Zetia (ezetimibe) 10 mg daily.  Will have you repeat labs in early January then we can decide if we need to try Repatha   Cholesterol Cholesterol is a fat. Your body needs a small amount of cholesterol. Cholesterol may build up in your blood vessels. This increases your chance of having a heart attack or stroke. You cannot feel your cholesterol levels. The only way to know your cholesterol level is high is with a blood test. Keep your test results. Work with your doctor to keep your cholesterol at a good level. WHAT DO THE TEST RESULTS MEAN?  Total cholesterol is how much cholesterol is in your blood.  LDL is bad cholesterol. This is the type that can build up. You want LDL to be low.  HDL is good cholesterol. It cleans your blood vessels and carries LDL away. You want HDL to be high.  Triglycerides are fat that the body can burn for energy or store. WHAT ARE GOOD LEVELS OF CHOLESTEROL?  Total cholesterol below 200.  LDL below 100 for people at risk. Below 70 for those at very high risk.  HDL above 50 is good. Above 60 is best.  Triglycerides below 150. HOW CAN I LOWER MY CHOLESTEROL?  Diet. Follow your diet programs as told by your doctor.  Choose fish, white meat chicken, roasted [place], or baked [place]key. Try not to eat red meat, fried foods, or processed meats such as sausage and lunch meats.  Eat lots of fresh fruits and vegetables.  Choose whole grains, beans, pasta, potatoes, and cereals.  Use only small amounts of olive, corn, or canola oils.  Try not to eat butter, mayonnaise, shortening, or palm kernel oils.  Try not to eat foods with trans fats.  Drink skim or nonfat milk. Eat low-fat or nonfat yogurt and cheeses. Try not to drink whole milk or cream. Try not to eat ice cream, egg yolks, and full-fat cheeses.  Healthy desserts include angel food  cake, ginger snaps, animal crackers, hard candy, popsicles, and low-fat or nonfat frozen yogurt. Try not to eat pastries, cakes, pies, and cookies.  Exercise. Follow your exercise programs as told by your doctor.  Be more active. You can try gardening, walking, or taking the stairs. Ask your doctor about how you can be more active.  Medicine. Take medicine as told by your doctor.   This information is not intended to replace advice given to you by your health care provider. Make sure you discuss any questions you have with your health care provider.   Document Released: 07/08/2008 Document Revised: 05/02/2014 Document Reviewed: 01/23/2013 Elsevier Interactive Patient Education [employer]2016 Elsevier Inc.

## 2016-02-18 NOTE — Assessment & Plan Note (Signed)
Patient has already had an almost 60% decrease in LDL with rosuvastatin and ezetimibe (both 10 mg).  We discussed the option of Repatha, however because of Medicare insurance costs, this may prove to be cost prohibitive.  Patient assistance is available, but expires on Dec 31, so we are best to wait until January to apply for that.  In the meantime, we are going to increase her rosuvastatin to 20 mg each Monday and Friday, 10 mg all other days.  She will continue with ezetimibe.  If she can tolerate this we will repeat cholesterol labs in January then determine if Repatha is still needed.  Patient agreeable to plan.

## 2016-02-19 ENCOUNTER — Other Ambulatory Visit: Payer: Self-pay | Admitting: Pharmacist Clinician (PhC)/ Clinical Pharmacy Specialist

## 2016-02-19 MED ORDER — ROSUVASTATIN CALCIUM 20 MG PO TABS: ORAL_TABLET | ORAL | 1 refills | Status: DC

## 2016-03-03 ENCOUNTER — Inpatient Hospital Stay (HOSPITAL_COMMUNITY): Admission: RE | Admit: 2016-03-03 | Payer: Medicare Other | Source: Ambulatory Visit

## 2016-03-04 ENCOUNTER — Other Ambulatory Visit: Payer: Self-pay | Admitting: Family

## 2016-03-15 ENCOUNTER — Other Ambulatory Visit: Payer: Self-pay | Admitting: Internal Medicine

## 2016-03-29 ENCOUNTER — Ambulatory Visit (HOSPITAL_COMMUNITY)
Admission: RE | Admit: 2016-03-29 | Discharge: 2016-03-29 | Disposition: A | Discharge status: Home / Self Care | Payer: Medicare Other | Source: Ambulatory Visit | Attending: Cardiology | Admitting: Cardiology

## 2016-03-29 DIAGNOSIS — I739 Peripheral vascular disease, unspecified: Secondary | ICD-10-CM | POA: Insufficient documentation

## 2016-03-29 DIAGNOSIS — I10 Essential (primary) hypertension: Secondary | ICD-10-CM | POA: Diagnosis not present

## 2016-04-27 ENCOUNTER — Other Ambulatory Visit: Payer: Self-pay | Admitting: Family

## 2016-05-05 ENCOUNTER — Ambulatory Visit: Payer: Medicare Other | Admitting: Internal Medicine

## 2016-05-30 ENCOUNTER — Telehealth: Payer: Self-pay | Admitting: Internal Medicine

## 2016-05-30 MED ORDER — ZETIA 10 MG PO TABS: 10.0000 mg | ORAL_TABLET | Freq: Every day | ORAL | 3 refills | Status: DC

## 2016-05-30 NOTE — Telephone Encounter (Signed)
Patient calling the office for samples of medication:   1.  What medication and dosage are you requesting samples for? Diane Richmond  2.  Are you currently out of this medication? Yes

## 2016-05-30 NOTE — Telephone Encounter (Signed)
Returned call to patient-reports she is out of Zetia.  Advised patient we do not get samples of this medication but I can send a new rx if needed.  Patient requested I do so.  Also states she cannot get refilled until the middle of February as she doesn't get her check until then.  Pt reports she would like to stay on this medication.  Advised I would see if there is any assistance we can provide to help with the price.  Pt verbalized understanding.  Spoke with pharmacist-no assistance available at this time.  Patient made aware.

## 2016-06-03 ENCOUNTER — Telehealth: Payer: Self-pay | Admitting: *Deleted

## 2016-06-03 MED ORDER — ZETIA 10 MG PO TABS: 10.0000 mg | ORAL_TABLET | Freq: Every day | ORAL | 2 refills | Status: DC

## 2016-06-03 NOTE — Telephone Encounter (Signed)
06/03/2016 2:14 PM Brandy Dot BeenJ Carden, CMA Lindell SparJenna M Elkins, RN Patient Calls    Comment: PLEASE WRITE GENERIC RX FOR ZETIA AND RESEND IT TO [facility], ALSO PLEASE SEND A 14 DAY SUPPLY TO LOCAL PHARMACY, PER PT REQUEST,     [facility] May [facility] 2016 [facility]  [facility]

## 2016-06-08 ENCOUNTER — Encounter: Payer: Self-pay | Admitting: Internal Medicine

## 2016-06-08 ENCOUNTER — Telehealth: Payer: Self-pay | Admitting: Internal Medicine

## 2016-06-08 MED ORDER — EZETIMIBE 10 MG PO TABS: 10.0000 mg | ORAL_TABLET | Freq: Every day | ORAL | 3 refills | Status: DC

## 2016-06-08 NOTE — Telephone Encounter (Signed)
Informed caller Rx has been changed to be written for generic/product selection permitted, [facility]. She voiced thanks - no further needs at this time.

## 2016-06-08 NOTE — Telephone Encounter (Signed)
[name] is calling and states that her mother's (patient) prescription was incorrect. [name] states that her mother cannot afford Zetia because it is a higher tier, so she would like to have the generic form of medication sent to pharmacy. Please call, thanks.

## 2016-06-24 ENCOUNTER — Ambulatory Visit (INDEPENDENT_AMBULATORY_CARE_PROVIDER_SITE_OTHER): Payer: Medicare Other | Admitting: Internal Medicine

## 2016-06-24 ENCOUNTER — Encounter: Payer: Self-pay | Admitting: Internal Medicine

## 2016-06-24 VITALS — BP 156/70 | HR 79 | Ht 62.0 in | Wt 117.6 lb

## 2016-06-24 DIAGNOSIS — I447 Left bundle-branch block, unspecified: Secondary | ICD-10-CM | POA: Diagnosis not present

## 2016-06-24 DIAGNOSIS — I7090 Unspecified atherosclerosis: Secondary | ICD-10-CM | POA: Diagnosis not present

## 2016-06-24 DIAGNOSIS — I739 Peripheral vascular disease, unspecified: Secondary | ICD-10-CM

## 2016-06-24 DIAGNOSIS — E7801 Familial hypercholesterolemia: Secondary | ICD-10-CM

## 2016-06-24 NOTE — Progress Notes (Signed)
OFFICE NOTE  Chief Complaint:  Leg pain  Primary Care Physician: Jeanine Luz, FNP  HPI:  Diane Richmond is a 71 y.o. female who is currently referred to me by Charlann Noss, FNP, for evaluation of elevated cholesterol. I reviewed several years of cholesterol data as of 2013. Currently her total cholesterol is 302 which compares to a total cholesterol 266 3 years ago. Her triglycerides are 167, HDL 31, LDL 237, and non-HDL cholesterol of 270. She had a direct LDL over 200 several years ago. In addition she has type 2 diabetes, hypertension, asthma, and an abnormal EKG demonstrating left bundle branch block. She denies any chest pain or worsening shortness of breath but has had some worsening fatigue and decreased exercise tolerance, particularly over the last 6 months. Her family history is significant for mother who died of massive heart attack at age 34 and father who had a stroke and died at age 53. She also had a sister who died of stroke. In the past she had been on Lipitor but had significant myalgias with this and was switched to Crestor 20 mg daily. She said this also bothered her and lower dose was recommended. Blood pressure is up today at 162/86 but she reports being nervous. She's never been a smoker and denies alcohol use. Her sleepiness score was 0. She mentions that she does get pain in her legs when walking certain distances that gets better when she rests.  01/29/2016  Diane Richmond returns today for follow-up. Unfortunately she did not make routine follow-up and we were unable to contact her for several months. We did relay the results of her extensive testing by mail. She was scheduled for follow-up appointment which she did not make and a referral to my partner which she canceled. She returns today continues to have symptoms concerning for claudication. She underwent a nuclear stress test which was negative for ischemia and demonstrated normal LV function with an EF of 64%.  Echocardiogram also showed an EF of 60-65%, grade 1 diastolic dysfunction, mild TR and PI with an RVSP of 31 mmHg. She had carotid Dopplers which showed mild bilateral carotid artery stenosis. Of note, she did have lower extremity arterial Dopplers which showed moderate to severe bilateral lower extremity arterial insufficiency. ABIs were 0.24 and the right and 0.4 on the left. Based on these findings I referred her to Dr. Allyson Sabal for evaluation and possible angiogram however she did not make this appointment. I also recommended additional lab work including an extended lipid profile as she has been intolerant to statins. We had to decrease her Crestor to 10 mg daily which she seems to be tolerating along with ezetimibe. She was being considered for Repatha and this will need to be reinitiated. 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  06/24/2016  Diane Richmond returns today for follow-up. She reports she does get some pain in her legs when she walks but is not lifestyle limiting at this point. She recently saw Dr. Allyson Sabal who talked about lower extremity angiogram however she declined and wanted to see how she did with medical therapy. Unfortunate she's not even taking daily aspirin. I highly encouraged that today. In addition we've been working on lowering cholesterol. She's currently only able to tolerate Crestor 10 mg alternating with 20 mg and then 10 mg 3 times a week. I would like to recheck her cholesterol to see where she is at currently. The plan is if she is not at goal LDL less than  70 and non-HDL cholesterol less than 161, then we would need to consider adding medication such neuropathic. There is clearly a survival benefit in patients as well as a decreased risk of critical limb ischemia that was demonstrated in the FOURIER trial.  PMHx:  Past Medical History:  Diagnosis Date  . Asthma   . Bronchitis   . Diabetes mellitus    type II  . HX: breast cancer   . Hyperlipidemia   .  Hypertension     Past Surgical History:  Procedure Laterality Date  . BREAST LUMPECTOMY  1994   left  . right port-a-cath  1994  . TONSILLECTOMY      FAMHx:  Family History  Problem Relation Age of Onset  . Heart disease Mother     CAD/MI  . Aneurysm Father     CVA  . Stroke Sister   . Hypertension Sister   . Breast cancer    . Diabetes    . Hypertension    . Cancer Brother   . Cancer Brother     SOCHx:   reports that she has never smoked. She quit smokeless tobacco use about 53 years ago. She reports that she does not drink alcohol or use drugs.  ALLERGIES:  Allergies  Allergen Reactions  . Atorvastatin     REACTION: legs ache  . Promethazine-Codeine [Promethazine-Codeine] Nausea And Vomiting    Pt states cough syrup she can't take    ROS: Pertinent items noted in HPI and remainder of comprehensive ROS otherwise negative.  HOME MEDS: Current Outpatient Prescriptions  Medication Sig Dispense Refill  . albuterol (PROVENTIL HFA;VENTOLIN HFA) 108 (90 BASE) MCG/ACT inhaler Inhale 2 puffs into the lungs every 6 (six) hours as needed for wheezing or shortness of breath. 1 Inhaler 0  . amLODipine (NORVASC) 5 MG tablet TAKE 1 TABLET BY MOUTH EVERY DAY 90 tablet 3  . aspirin EC 81 MG tablet Take 81 mg by mouth daily.    . Coenzyme Q10 (COQ10) 50 MG CAPS Take by mouth.    . ezetimibe (ZETIA) 10 MG tablet Take 1 tablet (10 mg total) by mouth daily. 90 tablet 3  . fluticasone (FLONASE) 50 MCG/ACT nasal spray Place 2 sprays into both nostrils daily. 16 g 6  . glucose blood test strip Accu-chek Smart View Test strips check blood sugar daily 100 each 12  . metFORMIN (GLUCOPHAGE) 500 MG tablet Take 1 tablet (500 mg total) by mouth 2 (two) times daily. Need office visit for more refills 60 tablet 0  . rosuvastatin (CRESTOR) 20 MG tablet Take 1/2 to 1 tablet by mouth daily as tolerated 30 tablet 1  . SYMBICORT 160-4.5 MCG/ACT inhaler INHALE 2 PUFFS INTO THE LUNGS 2 (TWO) TIMES  DAILY. 10.2 g 11   No current facility-administered medications for this visit.     LABS/IMAGING: No results found for this or any previous visit (from the past 48 hour(s)). No results found.  WEIGHTS: Wt Readings from Last 3 Encounters:  06/24/16 117 lb 9.6 oz (53.3 kg)  02/09/16 119 lb (54 kg)  01/29/16 115 lb 6.4 oz (52.3 kg)    VITALS: BP (!) 156/70   Pulse 79   Ht 5\' 2"  (1.575 m)   Wt 117 lb 9.6 oz (53.3 kg)   BMI 21.51 kg/m   EXAM: General appearance: alert and no distress Neck: no carotid bruit and no JVD Lungs: clear to auscultation bilaterally Heart: regular rate and rhythm Abdomen: soft, non-tender; bowel sounds normal; no  masses,  no organomegaly Extremities: extremities normal, atraumatic, no cyanosis or edema Pulses: faint DP/PT pulses Skin: Skin color, texture, turgor normal. No rashes or lesions Neurologic: Grossly normal Psych: Pleasant  EKG: Normal sinus rhythm 79, LBBB  ASSESSMENT: 1. Severely elevated cholesterol-likely FH (familial hypercholesterolemia) 2. Statin intolerance 3. Bilateral carotid bruits- mild bilateral carotid stenosis (07/2015) 4. Claudication concerning for PAD - moderate to severe PAD (07/2015) 5. Type 2 diabetes 6. Hypertension 7. LBBB 8. Progressive fatigue with exertion 9. Systolic murmur  PLAN: 1.   Diane Richmond really downplayed her claudication today. She says she's been walking in not having a lot of symptoms. Interestingly she's not even taking daily aspirin which is recommended in the past. Advised her to start on 81 mg aspirin daily. She is only tolerant of 3 time weekly Crestor and she seems to take that in addition to ezetimibe. We'll go ahead and check a repeat lipid profile. Based on those findings we may need to add Repatha.  Follow-up with me in 3 months.  Chrystie NoseKenneth C. Hilty, MD, [facility] Attending Cardiologist [facility]  Chrystie NoseKenneth C Hilty 06/24/2016, 3:25 PM

## 2016-06-24 NOTE — Patient Instructions (Signed)
Your physician has recommended you make the following change in your medication: TAKE aspirin 81mg  every day  Your physician recommends that you return for lab work FASTING (to check cholesterol)  Your physician recommends that you schedule a follow-up appointment in: THREE MONTHS with Dr. Rennis GoldenHilty

## 2016-07-05 ENCOUNTER — Other Ambulatory Visit: Payer: Self-pay | Admitting: Family

## 2016-07-12 ENCOUNTER — Other Ambulatory Visit: Payer: Self-pay | Admitting: Family

## 2016-07-13 ENCOUNTER — Other Ambulatory Visit: Payer: Self-pay | Admitting: Family

## 2016-07-19 ENCOUNTER — Other Ambulatory Visit: Payer: Self-pay | Admitting: Family

## 2016-07-20 ENCOUNTER — Other Ambulatory Visit: Payer: Self-pay | Admitting: Family

## 2016-07-20 ENCOUNTER — Telehealth: Payer: Self-pay | Admitting: Family

## 2016-07-20 MED ORDER — METFORMIN HCL 500 MG PO TABS: 500.0000 mg | ORAL_TABLET | Freq: Two times a day (BID) | ORAL | 0 refills | Status: DC

## 2016-07-20 NOTE — Telephone Encounter (Signed)
Daughter called and pharmcay states pt needs to go back on metformin. Would like a prescription to get her through until then.

## 2016-07-20 NOTE — Telephone Encounter (Signed)
Appointment made and rx refilled.

## 2016-07-25 IMAGING — NM NM MISC PROCEDURE
6 series · 36 of 36 positions shown · non-contrast
Comparison: none

[Series 1: wbr rest · 6.40mm/px · 6 of 64 frames shown]
[frame 6/64]
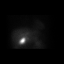
[frame 16/64]
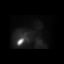
[frame 27/64]
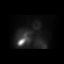
[frame 38/64]
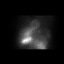
[frame 48/64]
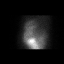
[frame 59/64]
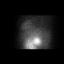

[Series 1: wbr_r-proj_st wbr rest · 6.40mm/px · 6 of 64 frames shown]
[frame 6/64]
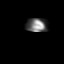
[frame 16/64]
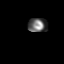
[frame 27/64]
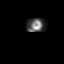
[frame 38/64]
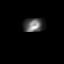
[frame 48/64]
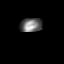
[frame 59/64]
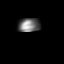

[Series 2: wbr stress-gsp · 6.40mm/px · 6 of 512 frames shown]
[frame 43/512]
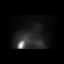
[frame 128/512]
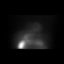
[frame 214/512]
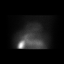
[frame 299/512]
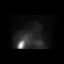
[frame 384/512]
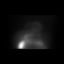
[frame 470/512]
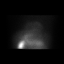

[Series 2: wbr_s-proj_st wbr stress-gsp · 6.40mm/px · 6 of 512 frames shown]
[frame 43/512]
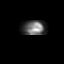
[frame 128/512]
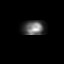
[frame 214/512]
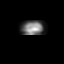
[frame 299/512]
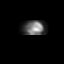
[frame 384/512]
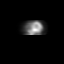
[frame 470/512]
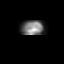

[Series 3: wbr_s-proj_st wbr stress-sum-em · 6.40mm/px · 6 of 64 frames shown]
[frame 6/64]
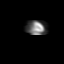
[frame 16/64]
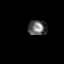
[frame 27/64]
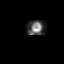
[frame 38/64]
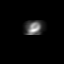
[frame 48/64]
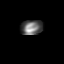
[frame 59/64]
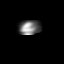

[Series 3: wbr stress-sum-em · 6.40mm/px · 6 of 64 frames shown]
[frame 6/64]
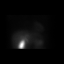
[frame 16/64]
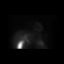
[frame 27/64]
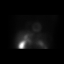
[frame 38/64]
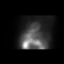
[frame 48/64]
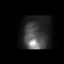
[frame 59/64]
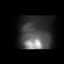

[36 of 36 positions shown; findings below may reference images not displayed]

Canned report from images found in remote index.

Refer to host system for actual result text.

## 2016-08-01 ENCOUNTER — Encounter: Payer: Medicare Other | Admitting: Family

## 2016-08-01 ENCOUNTER — Other Ambulatory Visit: Payer: Self-pay | Admitting: Internal Medicine

## 2016-08-18 ENCOUNTER — Ambulatory Visit (INDEPENDENT_AMBULATORY_CARE_PROVIDER_SITE_OTHER): Payer: Medicare Other | Admitting: Family

## 2016-08-18 ENCOUNTER — Encounter: Payer: Self-pay | Admitting: Family

## 2016-08-18 VITALS — BP 150/70 | HR 93 | Temp 98.0°F | Ht 62.0 in | Wt 117.0 lb

## 2016-08-18 DIAGNOSIS — I739 Peripheral vascular disease, unspecified: Secondary | ICD-10-CM

## 2016-08-18 DIAGNOSIS — J4520 Mild intermittent asthma, uncomplicated: Secondary | ICD-10-CM | POA: Diagnosis not present

## 2016-08-18 DIAGNOSIS — Z7289 Other problems related to lifestyle: Secondary | ICD-10-CM | POA: Diagnosis not present

## 2016-08-18 DIAGNOSIS — Z0000 Encounter for general adult medical examination without abnormal findings: Secondary | ICD-10-CM

## 2016-08-18 DIAGNOSIS — I10 Essential (primary) hypertension: Secondary | ICD-10-CM | POA: Diagnosis not present

## 2016-08-18 DIAGNOSIS — E119 Type 2 diabetes mellitus without complications: Secondary | ICD-10-CM | POA: Diagnosis not present

## 2016-08-18 MED ORDER — METFORMIN HCL 500 MG PO TABS: 500.0000 mg | ORAL_TABLET | Freq: Two times a day (BID) | ORAL | 0 refills | Status: DC

## 2016-08-18 NOTE — Progress Notes (Signed)
Pre visit review using our clinic review tool, if applicable. No additional management support is needed unless otherwise documented below in the visit note. 

## 2016-08-18 NOTE — Assessment & Plan Note (Signed)
Reviewed and updated patient's medical, surgical, family and social history. Medications and allergies were also reviewed. Basic screenings for depression, activities of daily living, hearing, cognition and safety were performed. Provider list was updated and health plan was provided to the patient.  

## 2016-08-18 NOTE — Assessment & Plan Note (Signed)
1) Anticipatory Guidance: Discussed importance of wearing a seatbelt while driving and not texting while driving; changing batteries in smoke detector at least once annually; wearing suntan lotion when outside; eating a balanced and moderate diet; getting physical activity at least 30 minutes per day.  2) Immunizations / Screenings / Labs:  Declines Prevnar and Pneumovax. All other immunizations are up to date per recommendations. Due for dental and vision exam encouraged to be completed independently. Breast cancer screening is up to date. Declines DEXA for bone mineral density screening. Obtain Hepatitis C antibody for Hepatitis C screening. All other screenings are up to date per recommendations. Obtain CBC, CMET, and lipid profile.    Overall well exam with multiple co-morbidities increasing risk for cardiovascular disease. Chronic conditions remain labile with current medication regimens and individually noted. Recommend increasing physical activity to 3-4 days of moderate level activity daily and consuming an intake that is moderate, varied and balanced. Continue other healthy lifestyle behaviors and choices. Follow up prevention exam in 1 year and follow up office visit for chronic conditions.

## 2016-08-18 NOTE — Patient Instructions (Addendum)
Thank you for choosing [facility].  SUMMARY AND INSTRUCTIONS:  Continue to take medications as prescribed.  Work on continuing to walk.   Follow up with [facility]istry [facility].  Medication:  Your prescription(s) have been submitted to your pharmacy or been printed and provided for you. Please take as directed and contact our office if you believe you are having problem(s) with the medication(s) or have any questions.  Labs:  Please stop by the lab on the lower level of the building for your blood work. Your results will be released to [place] (or called to you) after review, usually within 72 hours after test completion. If any changes need to be made, you will be notified at that same time.  1.) The lab is open from 7:30am to 5:30 pm Monday-Friday 2.) No appointment is necessary 3.) Fasting (if needed) is 6-8 hours after food and drink; black coffee and water are okay    Follow up:  If your symptoms worsen or fail to improve, please contact our office for further instruction, or in case of emergency go directly to the emergency room at the closest medical facility.   Health Maintenance  Topic Date Due  . Hepatitis C Screening  07/30/1945  . OPHTHALMOLOGY EXAM  04/13/1956  . URINE MICROALBUMIN  04/13/1956  . DEXA SCAN  04/14/2011  . FOOT EXAM  09/24/2014  . HEMOGLOBIN A1C  12/09/2015  . PNA vac Low Risk Adult (1 of 2 - PCV13) 07/24/2017 (Originally 04/14/2011)  . INFLUENZA VACCINE  11/23/2016  . MAMMOGRAM  01/13/2018  . COLONOSCOPY  07/20/2021  . TETANUS/TDAP  07/20/2021   Health Maintenance, Female Adopting a healthy lifestyle and getting preventive care can go a long way to promote health and wellness. Talk with your health care provider about what schedule of regular examinations is right for you. This is a good chance for you to check in with your provider about disease prevention and staying healthy. In between checkups, there are plenty of things you can do on  your own. Experts have done a lot of research about which lifestyle changes and preventive measures are most likely to keep you healthy. Ask your health care provider for more information. Weight and diet Eat a healthy diet  Be sure to include plenty of vegetables, fruits, low-fat dairy products, and lean protein.  Do not eat a lot of foods high in solid fats, added sugars, or salt.  Get regular exercise. This is one of the most important things you can do for your health.  Most adults should exercise for at least 150 minutes each week. The exercise should increase your heart rate and make you sweat (moderate-intensity exercise).  Most adults should also do strengthening exercises at least twice a week. This is in addition to the moderate-intensity exercise. Maintain a healthy weight  Body mass index (BMI) is a measurement that can be used to identify possible weight problems. It estimates body fat based on height and weight. Your health care provider can help determine your BMI and help you achieve or maintain a healthy weight.  For females 67 years of age and older:  A BMI below 18.5 is considered underweight.  A BMI of 18.5 to 24.9 is normal.  A BMI of 25 to 29.9 is considered overweight.  A BMI of 30 and above is considered obese. Watch levels of cholesterol and blood lipids  You should start having your blood tested for lipids and cholesterol at 71 years of age, then  have this test every 5 years.  You may need to have your cholesterol levels checked more often if:  Your lipid or cholesterol levels are high.  You are older than 71 years of age.  You are at high risk for heart disease. Cancer screening Lung Cancer  Lung cancer screening is recommended for adults 7-76 years old who are at high risk for lung cancer because of a history of smoking.  A yearly low-dose CT scan of the lungs is recommended for people who:  Currently smoke.  Have quit within the past 15  years.  Have at least a 30-pack-year history of smoking. A pack year is smoking an average of one pack of cigarettes a day for 1 year.  Yearly screening should continue until it has been 15 years since you quit.  Yearly screening should stop if you develop a health problem that would prevent you from having lung cancer treatment. Breast Cancer  Practice breast self-awareness. This means understanding how your breasts normally appear and feel.  It also means doing regular breast self-exams. Let your health care provider know about any changes, no matter how small.  If you are in your 20s or 30s, you should have a clinical breast exam (CBE) by a health care provider every 1-3 years as part of a regular health exam.  If you are 71 or older, have a CBE every year. Also consider having a breast X-ray (mammogram) every year.  If you have a family history of breast cancer, talk to your health care provider about genetic screening.  If you are at high risk for breast cancer, talk to your health care provider about having an MRI and a mammogram every year.  Breast cancer gene (BRCA) assessment is recommended for women who have family members with BRCA-related cancers. BRCA-related cancers include:  Breast.  Ovarian.  Tubal.  Peritoneal cancers.  Results of the assessment will determine the need for genetic counseling and BRCA1 and BRCA2 testing. Cervical Cancer  Your health care provider may recommend that you be screened regularly for cancer of the pelvic organs (ovaries, uterus, and vagina). This screening involves a pelvic examination, including checking for microscopic changes to the surface of your cervix (Pap test). You may be encouraged to have this screening done every 3 years, beginning at age 19.  For women ages 74-65, health care providers may recommend pelvic exams and Pap testing every 3 years, or they may recommend the Pap and pelvic exam, combined with testing for human  papilloma virus (HPV), every 5 years. Some types of HPV increase your risk of cervical cancer. Testing for HPV may also be done on women of any age with unclear Pap test results.  Other health care providers may not recommend any screening for nonpregnant women who are considered low risk for pelvic cancer and who do not have symptoms. Ask your health care provider if a screening pelvic exam is right for you.  If you have had past treatment for cervical cancer or a condition that could lead to cancer, you need Pap tests and screening for cancer for at least 20 years after your treatment. If Pap tests have been discontinued, your risk factors (such as having a new sexual partner) need to be reassessed to determine if screening should resume. Some women have medical problems that increase the chance of getting cervical cancer. In these cases, your health care provider may recommend more frequent screening and Pap tests. Colorectal Cancer  This type of  cancer can be detected and often prevented.  Routine colorectal cancer screening usually begins at 71 years of age and continues through 71 years of age.  Your health care provider may recommend screening at an earlier age if you have risk factors for colon cancer.  Your health care provider may also recommend using home test kits to check for hidden blood in the stool.  A small camera at the end of a tube can be used to examine your colon directly (sigmoidoscopy or colonoscopy). This is done to check for the earliest forms of colorectal cancer.  Routine screening usually begins at age 34.  Direct examination of the colon should be repeated every 5-10 years through 71 years of age. However, you may need to be screened more often if early forms of precancerous polyps or small growths are found. Skin Cancer  Check your skin from head to toe regularly.  Tell your health care provider about any new moles or changes in moles, especially if there is a  change in a mole's shape or color.  Also tell your health care provider if you have a mole that is larger than the size of a pencil eraser.  Always use sunscreen. Apply sunscreen liberally and repeatedly throughout the day.  Protect yourself by wearing long sleeves, pants, a wide-brimmed hat, and sunglasses whenever you are outside. Heart disease, diabetes, and high blood pressure  High blood pressure causes heart disease and increases the risk of stroke. High blood pressure is more likely to develop in:  People who have blood pressure in the high end of the normal range (130-139/85-89 mm Hg).  People who are overweight or obese.  People who are African American.  If you are 12-58 years of age, have your blood pressure checked every 3-5 years. If you are 10 years of age or older, have your blood pressure checked every year. You should have your blood pressure measured twice-once when you are at a hospital or clinic, and once when you are not at a hospital or clinic. Record the average of the two measurements. To check your blood pressure when you are not at a hospital or clinic, you can use:  An automated blood pressure machine at a pharmacy.  A home blood pressure monitor.  If you are between 34 years and 55 years old, ask your health care provider if you should take aspirin to prevent strokes.  Have regular diabetes screenings. This involves taking a blood sample to check your fasting blood sugar level.  If you are at a normal weight and have a low risk for diabetes, have this test once every three years after 71 years of age.  If you are overweight and have a high risk for diabetes, consider being tested at a younger age or more often. Preventing infection Hepatitis B  If you have a higher risk for hepatitis B, you should be screened for this virus. You are considered at high risk for hepatitis B if:  You were born in a country where hepatitis B is common. Ask your health care  provider which countries are considered high risk.  Your parents were born in a high-risk country, and you have not been immunized against hepatitis B (hepatitis B vaccine).  You have HIV or AIDS.  You use needles to inject street drugs.  You live with someone who has hepatitis B.  You have had sex with someone who has hepatitis B.  You get hemodialysis treatment.  You take certain  medicines for conditions, including cancer, organ transplantation, and autoimmune conditions. Hepatitis C  Blood testing is recommended for:  Everyone born from [birth year] through 1965.  Anyone with known risk factors for hepatitis C. Sexually transmitted infections (STIs)  You should be screened for sexually transmitted infections (STIs) including gonorrhea and chlamydia if:  You are sexually active and are younger than 71 years of age.  You are older than 71 years of age and your health care provider tells you that you are at risk for this type of infection.  Your sexual activity has changed since you were last screened and you are at an increased risk for chlamydia or gonorrhea. Ask your health care provider if you are at risk.  If you do not have HIV, but are at risk, it may be recommended that you take a prescription medicine daily to prevent HIV infection. This is called pre-exposure prophylaxis (PrEP). You are considered at risk if:  You are sexually active and do not regularly use condoms or know the HIV status of your partner(s).  You take drugs by injection.  You are sexually active with a partner who has HIV. Talk with your health care provider about whether you are at high risk of being infected with HIV. If you choose to begin PrEP, you should first be tested for HIV. You should then be tested every 3 months for as long as you are taking PrEP. Pregnancy  If you are premenopausal and you may become pregnant, ask your health care provider about preconception counseling.  If you may become  pregnant, take 400 to 800 micrograms (mcg) of folic acid every day.  If you want to prevent pregnancy, talk to your health care provider about birth control (contraception). Osteoporosis and menopause  Osteoporosis is a disease in which the bones lose minerals and strength with aging. This can result in serious bone fractures. Your risk for osteoporosis can be identified using a bone density scan.  If you are 53 years of age or older, or if you are at risk for osteoporosis and fractures, ask your health care provider if you should be screened.  Ask your health care provider whether you should take a calcium or vitamin D supplement to lower your risk for osteoporosis.  Menopause may have certain physical symptoms and risks.  Hormone replacement therapy may reduce some of these symptoms and risks. Talk to your health care provider about whether hormone replacement therapy is right for you. Follow these instructions at home:  Schedule regular health, dental, and eye exams.  Stay current with your immunizations.  Do not use any tobacco products including cigarettes, chewing tobacco, or electronic cigarettes.  If you are pregnant, do not drink alcohol.  If you are breastfeeding, limit how much and how often you drink alcohol.  Limit alcohol intake to no more than 1 drink per day for nonpregnant women. One drink equals 12 ounces of beer, 5 ounces of wine, or 1 ounces of hard liquor.  Do not use street drugs.  Do not share needles.  Ask your health care provider for help if you need support or information about quitting drugs.  Tell your health care provider if you often feel depressed.  Tell your health care provider if you have ever been abused or do not feel safe at home. This information is not intended to replace advice given to you by your health care provider. Make sure you discuss any questions you have with your health care provider. Document  Released: 10/25/2010 Document  Revised: 09/17/2015 Document Reviewed: 01/13/2015 Elsevier Interactive Patient Education  2017 [employer].

## 2016-08-18 NOTE — Assessment & Plan Note (Signed)
Obtain hemoglobin A1c and urine microalbumin. Tramadol controlled current dosage of metformin. Maintained on rosuvastatin for CAD risk reduction. Declines Pneumovax. Diabetic foot exam updated today. Continue current dosage of metformin pending hemoglobin A1c results.

## 2016-08-18 NOTE — Progress Notes (Signed)
Subjective:    Patient ID: Diane Richmond, female    DOB: Mar 23, 1946, 71 y.o.   MRN: [id number]  Chief Complaint  Patient presents with  . Annual Exam    needs, metformin Refill    HPI:  ASLEY BASKERVILLE is a 71 y.o. female who presents today for a Medicare Annual Wellness/Physical exam.    1) Health Maintenance -   Diet - Averages about 3 meals per day consisting of a regular diet; Caffeine intake of about 3-4 cups occasionally  Exercise - Yard work and walking; nothing structured  2) Preventative Exams / Immunizations:  Dental -- Due for exam   Vision -- Due for exam    Health Maintenance  Topic Date Due  . Hepatitis C Screening  13-Feb-1946  . OPHTHALMOLOGY EXAM  04/13/1956  . URINE MICROALBUMIN  04/13/1956  . DEXA SCAN  04/14/2011  . FOOT EXAM  09/24/2014  . HEMOGLOBIN A1C  12/09/2015  . PNA vac Low Risk Adult (1 of 2 - PCV13) 07/24/2017 (Originally 04/14/2011)  . INFLUENZA VACCINE  11/23/2016  . MAMMOGRAM  01/13/2018  . COLONOSCOPY  07/20/2021  . TETANUS/TDAP  07/20/2021     There is no immunization history for the selected administration types on file for this patient.  RISK FACTORS  Tobacco History  Smoking Status  . Never Smoker  Smokeless Tobacco  . Former Neurosurgeon  . Quit date: 03/02/1963     Cardiac risk factors: advanced age (older than 15 for men, 27 for women), diabetes mellitus, dyslipidemia and hypertension.  Depression Screen  Depression screen [facility] 2/9 10/17/2013  Decreased Interest 0  Down, Depressed, Hopeless 1  PHQ - 2 Score 1     Activities of Daily Living In your present state of health, do you have any difficulty performing the following activities?:  Driving? No Managing money?  No Feeding yourself? No Getting from bed to chair? No Climbing a flight of stairs? No Preparing food and eating?: No Bathing or showering? No Getting dressed: No Getting to the toilet? No Using the toilet: No Moving around from place to place:  No In the past year have you fallen or had a near fall?:No   Home Safety Has smoke detector and wears seat belts. No excess sun exposure. Are there smokers in your home (other than you)?  No Do you feel safe at home?  Yes  Hearing Difficulties: No Do you often ask people to speak up or repeat themselves? No Do you experience ringing or noises in your ears? No  Do you have difficulty understanding soft or whispered voices? No    Cognitive Testing  Alert? Yes   Normal Appearance? Yes  Oriented to person? Yes  Place? Yes   Time? Yes  Recall of three objects?  Yes  Can perform simple calculations? Yes  Displays appropriate judgment? Yes  Can read the correct time from a watch face? Yes  Do you feel that you have a problem with memory? No  Do you often misplace items? No   Advanced Directives have been discussed with the patient? Yes   Current Physicians/Providers and Suppliers  1. Marcos Eke, FNP - Internal Medicine 2. Zoila Shutter, MD - Cardiology  3. Nanetta Batty, MD - Cardiology  Indicate any recent Medical Services you may have received from other than [facility] providers in the past year (date may be approximate).  All answers were reviewed with the patient and necessary referrals were made:  Jeanine Luz, FNP  08/18/2016    Allergies  Allergen Reactions  . Atorvastatin     REACTION: legs ache  . Promethazine-Codeine [Promethazine-Codeine] Nausea And Vomiting    Pt states cough syrup she can't take     Outpatient Medications Prior to Visit  Medication Sig Dispense Refill  . albuterol (PROVENTIL HFA;VENTOLIN HFA) 108 (90 BASE) MCG/ACT inhaler Inhale 2 puffs into the lungs every 6 (six) hours as needed for wheezing or shortness of breath. 1 Inhaler 0  . amLODipine (NORVASC) 5 MG tablet TAKE 1 TABLET BY MOUTH EVERY DAY 90 tablet 3  . aspirin EC 81 MG tablet Take 81 mg by mouth daily.    . Coenzyme Q10 (COQ10) 50 MG CAPS Take by mouth.    . ezetimibe (ZETIA) 10  MG tablet Take 1 tablet (10 mg total) by mouth daily. 90 tablet 3  . fluticasone (FLONASE) 50 MCG/ACT nasal spray Place 2 sprays into both nostrils daily. 16 g 6  . glucose blood test strip Accu-chek Smart View Test strips check blood sugar daily 100 each 12  . rosuvastatin (CRESTOR) 10 MG tablet TAKE ONE TABLET BY MOUTH ONCE DAILY (Patient taking differently: TAKE ONE TABLET BY MOUTH ONCE DAILY except Monday and Friday) 30 tablet 5  . rosuvastatin (CRESTOR) 20 MG tablet Take 1/2 to 1 tablet by mouth daily as tolerated (Patient taking differently: Take 1 tablet by mouth Monday and Friday.) 30 tablet 1  . SYMBICORT 160-4.5 MCG/ACT inhaler INHALE 2 PUFFS INTO THE LUNGS 2 (TWO) TIMES DAILY. 10.2 g 11  . metFORMIN (GLUCOPHAGE) 500 MG tablet Take 1 tablet (500 mg total) by mouth 2 (two) times daily. Need office visit for more refills 60 tablet 0   No facility-administered medications prior to visit.      Past Medical History:  Diagnosis Date  . Asthma   . Bronchitis   . Diabetes mellitus    type II  . HX: breast cancer   . Hyperlipidemia   . Hypertension      Past Surgical History:  Procedure Laterality Date  . BREAST LUMPECTOMY  1994   left  . right port-a-cath  1994  . TONSILLECTOMY       Family History  Problem Relation Age of Onset  . Heart disease Mother     CAD/MI  . Aneurysm Father     CVA  . Stroke Sister   . Hypertension Sister   . Breast cancer    . Diabetes    . Hypertension    . Cancer Brother   . Cancer Brother      Social History   Social History  . Marital status: Married    Spouse name: N/A  . Number of children: 4  . Years of education: 8   Occupational History  . nurse assistant - retired    Social History Main Topics  . Smoking status: Never Smoker  . Smokeless tobacco: Former Neurosurgeon    Quit date: 03/02/1963  . Alcohol use No  . Drug use: No  . Sexual activity: Not Currently   Other Topics Concern  . Not on file   Social History  Narrative   8th grade. Married '67 - .  4 dtrs - '67, '70, '72, '78. Grandchildren - 4. 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 Lives with husband.    Denies abuse and feels safe at home.      Review of Systems  Constitutional: Denies fever, chills, fatigue, or significant weight gain/loss. HENT: Head: Denies headache or  neck pain Ears: Denies changes in hearing, ringing in ears, earache, drainage Nose: Denies discharge, stuffiness, itching, nosebleed, sinus pain Throat: Denies sore throat, hoarseness, dry mouth, sores, thrush Eyes: Denies loss/changes in vision, pain, redness, blurry/double vision, flashing lights Cardiovascular: Denies chest pain/discomfort, tightness, palpitations, shortness of breath with activity, difficulty lying down, swelling, sudden awakening with shortness of breath Respiratory: Denies shortness of breath, cough, sputum production, wheezing Gastrointestinal: Denies dysphasia, heartburn, change in appetite, nausea, change in bowel habits, rectal bleeding, constipation, diarrhea, yellow skin or eyes Genitourinary: Denies frequency, urgency, burning/pain, blood in urine, incontinence, change in urinary strength. Musculoskeletal: Denies muscle/joint pain, stiffness, back pain, redness or swelling of joints, trauma Skin: Denies rashes, lumps, itching, dryness, color changes, or hair/nail changes Neurological: Denies dizziness, fainting, seizures, weakness, numbness, tingling, tremor Psychiatric - Denies nervousness, stress, depression or memory loss Endocrine: Denies heat or cold intolerance, sweating, frequent urination, excessive thirst, changes in appetite Hematologic: Denies ease of bruising or bleeding    Objective:     BP (!) 150/70 (BP Location: Right Arm, Patient Position: Sitting, Cuff Size: Normal)   Pulse 93   Temp 98 F (36.7 C) (Oral)   Ht  (1.575 m)   Wt 117 lb 0.6 oz (53.1 kg)   SpO2 98%   BMI 21.41 kg/m  Nursing note and vital signs  reviewed.  Physical Exam  Constitutional: She is oriented to person, place, and time. She appears well-developed and well-nourished.  HENT:  Head: Normocephalic.  Right Ear: Hearing, tympanic membrane, external ear and ear canal normal.  Left Ear: Hearing, tympanic membrane, external ear and ear canal normal.  Nose: Nose normal.  Mouth/Throat: Uvula is midline, oropharynx is clear and moist and mucous membranes are normal.  Eyes: Conjunctivae and EOM are normal. Pupils are equal, round, and reactive to light.  Neck: Neck supple. No JVD present. No tracheal deviation present. No thyromegaly present.  Cardiovascular: Normal rate, regular rhythm, normal heart sounds and intact distal pulses.   Pulmonary/Chest: Effort normal and breath sounds normal.  Abdominal: Soft. Bowel sounds are normal. She exhibits no distension and no mass. There is no tenderness. There is no rebound and no guarding.  Musculoskeletal: Normal range of motion. She exhibits no edema or tenderness.  Lymphadenopathy:    She has no cervical adenopathy.  Neurological: She is alert and oriented to person, place, and time. She has normal reflexes. No cranial nerve deficit. She exhibits normal muscle tone. Coordination normal.  Skin: Skin is warm and dry.  Psychiatric: She has a normal mood and affect. Her behavior is normal. Judgment and thought content normal.       Assessment & Plan:   During the course of the visit the patient was educated and counseled about appropriate screening and preventive services including:    Pneumococcal vaccine   Hepatitis B vaccine  Colorectal cancer screening  Diabetes screening  Nutrition counseling   Diet review for nutrition referral? Yes ____  Not Indicated _X___   Patient Instructions (the written plan) was given to the patient.  Medicare Attestation I have personally reviewed: The patient's medical and social history Their use of alcohol, tobacco or illicit drugs Their  current medications and supplements The patient's functional ability including ADLs,fall risks, home safety risks, cognitive, and hearing and visual impairment Diet and physical activities Evidence for depression or mood disorders  The patient's weight, height, BMI,  have been recorded in the chart.  I have made referrals, counseling, and provided education to the  patient based on review of the above and I have provided the patient with a written personalized care plan for preventive services.     Problem List Items Addressed This Visit      Cardiovascular and Mediastinum   Essential hypertension    Blood pressure slightly elevated above goal 140/90 with current medication regimen and no adverse side effects. Denies worse headache of life with no new symptoms of end organ damage noted on physical exam. Encouraged to decrease sodium in diet and continue to monitor blood pressure at home. If remains elevated consider additional medications.      PAD (peripheral artery disease) (HCC)    Stable and working with vascular surgery/cardiology. Continue management of risk factors to decrease progression. Continue monitor.        Respiratory   Asthma    Maintained on albuterol and Symbicort with no exacerbations. Continue to monitor.        Endocrine   DM II (diabetes mellitus, type II), controlled (HCC)    Obtain hemoglobin A1c and urine microalbumin. Tramadol controlled current dosage of metformin. Maintained on rosuvastatin for CAD risk reduction. Declines Pneumovax. Diabetic foot exam updated today. Continue current dosage of metformin pending hemoglobin A1c results.      Relevant Medications   metFORMIN (GLUCOPHAGE) 500 MG tablet   Other Relevant Orders   Hemoglobin A1c   Urine Microalbumin w/creat. ratio     Other   Routine general medical examination at a health care facility    1) Anticipatory Guidance: Discussed importance of wearing a seatbelt while driving and not texting  while driving; changing batteries in smoke detector at least once annually; wearing suntan lotion when outside; eating a balanced and moderate diet; getting physical activity at least 30 minutes per day.  2) Immunizations / Screenings / Labs:  Declines Prevnar and Pneumovax. All other immunizations are up to date per recommendations. Due for dental and vision exam encouraged to be completed independently. Breast cancer screening is up to date. Declines DEXA for bone mineral density screening. Obtain Hepatitis C antibody for Hepatitis C screening. All other screenings are up to date per recommendations. Obtain CBC, CMET, and lipid profile.    Overall well exam with multiple co-morbidities increasing risk for cardiovascular disease. Chronic conditions remain labile with current medication regimens and individually noted. Recommend increasing physical activity to 3-4 days of moderate level activity daily and consuming an intake that is moderate, varied and balanced. Continue other healthy lifestyle behaviors and choices. Follow up prevention exam in 1 year and follow up office visit for chronic conditions.        Relevant Orders   CBC   Comprehensive metabolic panel   Medicare annual wellness visit, subsequent - Primary    Reviewed and updated patient's medical, surgical, family and social history. Medications and allergies were also reviewed. Basic screenings for depression, activities of daily living, hearing, cognition and safety were performed. Provider list was updated and health plan was provided to the patient.        Other Visit Diagnoses    Other problems related to lifestyle       Relevant Orders   Hepatitis C antibody       I have changed Ms. Pertuit's metFORMIN. I am also having her maintain her albuterol, SYMBICORT, fluticasone, glucose blood, amLODipine, CoQ10, rosuvastatin, ezetimibe, aspirin EC, and rosuvastatin.   Meds ordered this encounter  Medications  . metFORMIN  (GLUCOPHAGE) 500 MG tablet    Sig: Take 1 tablet (500  mg total) by mouth 2 (two) times daily.    Dispense:  180 tablet    Refill:  0    Order Specific Question:   Supervising Provider    Answer:   Hillard Danker A [[id number]]     Follow-up: Return in about 6 months (around 02/17/2017), or if symptoms worsen or fail to improve.   Jeanine Luz, FNP

## 2016-08-18 NOTE — Assessment & Plan Note (Signed)
Maintained on albuterol and Symbicort with no exacerbations. Continue to monitor.

## 2016-08-18 NOTE — Assessment & Plan Note (Signed)
Blood pressure slightly elevated above goal 140/90 with current medication regimen and no adverse side effects. Denies worse headache of life with no new symptoms of end organ damage noted on physical exam. Encouraged to decrease sodium in diet and continue to monitor blood pressure at home. If remains elevated consider additional medications.

## 2016-08-18 NOTE — Assessment & Plan Note (Signed)
Stable and working with vascular surgery/cardiology. Continue management of risk factors to decrease progression. Continue monitor.

## 2016-08-24 ENCOUNTER — Other Ambulatory Visit: Payer: Self-pay

## 2016-08-24 NOTE — Patient Outreach (Signed)
[facility]  08/24/2016  Diane Richmond January 25, 1946 [id number]   Medication  Adherence to Diane Richmond Diane Richmond,  Diane Richmond already pick up her metformin 500 mg from [facility]  On 08/18/16 and pick up 90 days supply she is good for 3 month .    Diane Richmond CPhT Pharmacy Technician [facility] Care Management Direct Dial [phone]  Fax [phone] [email or web address]

## 2016-09-06 ENCOUNTER — Other Ambulatory Visit (INDEPENDENT_AMBULATORY_CARE_PROVIDER_SITE_OTHER): Payer: Medicare Other

## 2016-09-06 ENCOUNTER — Encounter: Payer: Self-pay | Admitting: Family

## 2016-09-06 DIAGNOSIS — Z0000 Encounter for general adult medical examination without abnormal findings: Secondary | ICD-10-CM

## 2016-09-06 DIAGNOSIS — Z7289 Other problems related to lifestyle: Secondary | ICD-10-CM

## 2016-09-06 DIAGNOSIS — E119 Type 2 diabetes mellitus without complications: Secondary | ICD-10-CM | POA: Diagnosis not present

## 2016-09-06 LAB — CBC
HEMATOCRIT: 40.4 % (ref 36.0–46.0)
HEMOGLOBIN: 13.3 g/dL (ref 12.0–15.0)
MCHC: 33 g/dL (ref 30.0–36.0)
MCV: 91.9 fl (ref 78.0–100.0)
PLATELETS: 239 10*3/uL (ref 150.0–400.0)
RBC: 4.39 Mil/uL (ref 3.87–5.11)
RDW: 13.4 % (ref 11.5–15.5)
WBC: 8.9 10*3/uL (ref 4.0–10.5)

## 2016-09-06 LAB — COMPREHENSIVE METABOLIC PANEL
ALBUMIN: 4.3 g/dL (ref 3.5–5.2)
ALT: 16 U/L (ref 0–35)
AST: 22 U/L (ref 0–37)
Alkaline Phosphatase: 57 U/L (ref 39–117)
BUN: 14 mg/dL (ref 6–23)
CALCIUM: 9.9 mg/dL (ref 8.4–10.5)
CO2: 32 meq/L (ref 19–32)
Chloride: 106 mEq/L (ref 96–112)
Creatinine, Ser: 0.71 mg/dL (ref 0.40–1.20)
GFR: 104.54 mL/min (ref >60.00)
Glucose, Bld: 91 mg/dL (ref 70–99)
POTASSIUM: 5 meq/L (ref 3.5–5.1)
Sodium: 142 mEq/L (ref 135–145)
Total Bilirubin: 0.4 mg/dL (ref 0.2–1.2)
Total Protein: 8.1 g/dL (ref 6.0–8.3)

## 2016-09-06 LAB — HEMOGLOBIN A1C: HEMOGLOBIN A1C: 6.2 % (ref 4.6–6.5)

## 2016-09-06 LAB — MICROALBUMIN / CREATININE URINE RATIO
CREATININE, U: 219.8 mg/dL
MICROALB/CREAT RATIO: 5.1 mg/g (ref 0.0–30.0)
Microalb, Ur: 11.1 mg/dL — ABNORMAL HIGH (ref 0.0–1.9)

## 2016-09-06 LAB — HEPATITIS C ANTIBODY: HCV AB: NEGATIVE

## 2016-09-20 ENCOUNTER — Telehealth: Payer: Self-pay | Admitting: Internal Medicine

## 2016-09-20 NOTE — Telephone Encounter (Signed)
Prior authorization for zetia 10mg  submitted via [email or web address] Key: [id number]

## 2016-09-22 MED ORDER — EZETIMIBE 10 MG PO TABS: 10.0000 mg | ORAL_TABLET | Freq: Every day | ORAL | 0 refills | Status: DC

## 2016-09-22 NOTE — Telephone Encounter (Signed)
Received notice from [facility] that request was denied b/c BRAND zetia is not on formulary - patient will need to try generic ezetimibe.   Rx(s) sent to pharmacy electronically w/note to provide patient generic

## 2016-10-06 ENCOUNTER — Other Ambulatory Visit: Payer: Self-pay | Admitting: Family

## 2016-10-11 ENCOUNTER — Ambulatory Visit: Payer: Medicare Other | Admitting: Internal Medicine

## 2016-11-02 ENCOUNTER — Telehealth: Payer: Self-pay | Admitting: Internal Medicine

## 2016-11-02 DIAGNOSIS — E7801 Familial hypercholesterolemia: Secondary | ICD-10-CM

## 2016-11-02 NOTE — Telephone Encounter (Signed)
New message   Pt c/o medication issue:  1. Name of Medication: ezetimibe (ZETIA) 10 MG tabletrosuvastatin (CRESTOR) 10 MG tablet  2. How are you currently taking this medication (dosage and times per day)? 10mg   3. Are you having a reaction (difficulty breathing--STAT)? no  4. What is your medication issue? Pt daughter has several questions about dosage that pt should be taking and requests call back

## 2016-11-02 NOTE — Telephone Encounter (Signed)
Returned call to daughter (ok per DPR)-wanting to clarify crestor dosage.   Advised that per chart review and after speaking with Dr. Rennis GoldenHilty to clarify, patient was unable to tolerate Crestor, therefore can take 10mg  three times weekly in addition to zetia.   Per review, patient needs lipid panel, overdue from last OV.     Daughter aware and verbalized understanding.

## 2016-11-03 ENCOUNTER — Other Ambulatory Visit: Payer: Self-pay

## 2016-11-04 ENCOUNTER — Other Ambulatory Visit: Payer: Self-pay

## 2016-11-04 ENCOUNTER — Other Ambulatory Visit: Payer: Self-pay | Admitting: Internal Medicine

## 2016-11-04 DIAGNOSIS — I10 Essential (primary) hypertension: Secondary | ICD-10-CM

## 2016-11-04 MED ORDER — AMLODIPINE BESYLATE 5 MG PO TABS: ORAL_TABLET | ORAL | 1 refills | Status: DC

## 2016-11-04 MED ORDER — ROSUVASTATIN CALCIUM 10 MG PO TABS: ORAL_TABLET | ORAL | 2 refills | Status: DC

## 2016-11-04 NOTE — Telephone Encounter (Signed)
Rx(s) sent to pharmacy electronically.  

## 2016-11-11 ENCOUNTER — Other Ambulatory Visit: Payer: Self-pay | Admitting: Family

## 2016-11-24 ENCOUNTER — Other Ambulatory Visit: Payer: Self-pay | Admitting: Internal Medicine

## 2016-11-24 MED ORDER — EZETIMIBE 10 MG PO TABS: 10.0000 mg | ORAL_TABLET | Freq: Every day | ORAL | 1 refills | Status: DC

## 2016-11-24 NOTE — Telephone Encounter (Signed)
Received prior auth request from [facility] pharmacy for brand-name zetia. In May 2018, a PA notice was received from patient's mail order pharmacy, at which time generic Rx was sent in since brand-name is not on formulary. Rx(s) sent to pharmacy electronically - [facility], ezetimibe 10mg  QD # 90

## 2016-11-28 ENCOUNTER — Other Ambulatory Visit: Payer: Self-pay | Admitting: Family

## 2016-12-07 ENCOUNTER — Other Ambulatory Visit: Payer: Medicare Other

## 2016-12-08 LAB — LIPID PANEL
Cholesterol: 175 mg/dL (ref <200)
HDL: 37 mg/dL — AB (ref >50)
LDL CALC: 125 mg/dL — AB (ref <100)
Total CHOL/HDL Ratio: 4.7 Ratio (ref <5.0)
Triglycerides: 63 mg/dL (ref <150)
VLDL: 13 mg/dL (ref <30)

## 2016-12-14 ENCOUNTER — Encounter: Payer: Self-pay | Admitting: Internal Medicine

## 2016-12-14 ENCOUNTER — Ambulatory Visit (INDEPENDENT_AMBULATORY_CARE_PROVIDER_SITE_OTHER): Payer: Medicare Other | Admitting: Internal Medicine

## 2016-12-14 VITALS — BP 130/70 | Ht 62.0 in | Wt 115.0 lb

## 2016-12-14 DIAGNOSIS — I447 Left bundle-branch block, unspecified: Secondary | ICD-10-CM

## 2016-12-14 DIAGNOSIS — I7090 Unspecified atherosclerosis: Secondary | ICD-10-CM | POA: Diagnosis not present

## 2016-12-14 DIAGNOSIS — E7801 Familial hypercholesterolemia: Secondary | ICD-10-CM | POA: Diagnosis not present

## 2016-12-14 DIAGNOSIS — I739 Peripheral vascular disease, unspecified: Secondary | ICD-10-CM | POA: Diagnosis not present

## 2016-12-14 DIAGNOSIS — E119 Type 2 diabetes mellitus without complications: Secondary | ICD-10-CM | POA: Diagnosis not present

## 2016-12-14 DIAGNOSIS — R0989 Other specified symptoms and signs involving the circulatory and respiratory systems: Secondary | ICD-10-CM

## 2016-12-14 NOTE — Progress Notes (Signed)
OFFICE NOTE  Chief Complaint:  No complaints  Primary Care Physician: Veryl Speak, FNP  HPI:  Diane Richmond is a 71 y.o. female who is currently referred to me by Charlann Noss, FNP, for evaluation of elevated cholesterol. I reviewed several years of cholesterol data as of 2013. Currently her total cholesterol is 302 which compares to a total cholesterol 266 3 years ago. Her triglycerides are 167, HDL 31, LDL 237, and non-HDL cholesterol of 270. She had a direct LDL over 200 several years ago. In addition she has type 2 diabetes, hypertension, asthma, and an abnormal EKG demonstrating left bundle branch block. She denies any chest pain or worsening shortness of breath but has had some worsening fatigue and decreased exercise tolerance, particularly over the last 6 months. Her family history is significant for mother who died of massive heart attack at age 86 and father who had a stroke and died at age 39. She also had a sister who died of stroke. In the past she had been on Lipitor but had significant myalgias with this and was switched to Crestor 20 mg daily. She said this also bothered her and lower dose was recommended. Blood pressure is up today at 162/86 but she reports being nervous. She's never been a smoker and denies alcohol use. Her sleepiness score was 0. She mentions that she does get pain in her legs when walking certain distances that gets better when she rests.  01/29/2016  Diane Richmond returns today for follow-up. Unfortunately she did not make routine follow-up and we were unable to contact her for several months. We did relay the results of her extensive testing by mail. She was scheduled for follow-up appointment which she did not make and a referral to my partner which she canceled. She returns today continues to have symptoms concerning for claudication. She underwent a nuclear stress test which was negative for ischemia and demonstrated normal LV function with an EF of 64%.  Echocardiogram also showed an EF of 60-65%, grade 1 diastolic dysfunction, mild TR and PI with an RVSP of 31 mmHg. She had carotid Dopplers which showed mild bilateral carotid artery stenosis. Of note, she did have lower extremity arterial Dopplers which showed moderate to severe bilateral lower extremity arterial insufficiency. ABIs were 0.24 and the right and 0.4 on the left. Based on these findings I referred her to Dr. Allyson Sabal for evaluation and possible angiogram however she did not make this appointment. I also recommended additional lab work including an extended lipid profile as she has been intolerant to statins. We had to decrease her Crestor to 10 mg daily which she seems to be tolerating along with ezetimibe. She was being considered for Repatha and this will need to be reinitiated. 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  06/24/2016  Diane Richmond returns today for follow-up. She reports she does get some pain in her legs when she walks but is not lifestyle limiting at this point. She recently saw Dr. Allyson Sabal who talked about lower extremity angiogram however she declined and wanted to see how she did with medical therapy. Unfortunate she's not even taking daily aspirin. I highly encouraged that today. In addition we've been working on lowering cholesterol. She's currently only able to tolerate Crestor 10 mg alternating with 20 mg and then 10 mg 3 times a week. I would like to recheck her cholesterol to see where she is at currently. The plan is if she is not at goal LDL less  than 70 and non-HDL cholesterol less than 161, then we would need to consider adding medication such neuropathic. There is clearly a survival benefit in patients as well as a decreased risk of critical limb ischemia that was demonstrated in the FOURIER trial.  12/14/2016  Diane Richmond returns today for follow-up. We repeated her lipid profile and pleased to say she's had marked improvement. Total cholesterol has decreased from 300  to 175 over the past year. Triglycerides are down from 167-63. HDL is increased from 31-37 and LDL-C has decreased from 237-125. She's currently on ezetimibe 10 mg daily and rosuvastatin 10 mg daily. She reports she cannot take increased doses of rosuvastatin secondary to myalgias and was completely intolerant of even low doses of atorvastatin. Although she's had marked and significant improvement in her LDL cholesterol, her goal LDL-C is less than 70 given the fact she has significant peripheral arterial disease with decreased ABIs. I discussed this with her at length today and feel that she is a good candidate for PCSK9 inhibitor. We discussed options and she felt that once monthly dosing with her path via the pump is the best option for her.  PMHx:  Past Medical History:  Diagnosis Date  . Asthma   . Bronchitis   . Diabetes mellitus    type II  . HX: breast cancer   . Hyperlipidemia   . Hypertension     Past Surgical History:  Procedure Laterality Date  . BREAST LUMPECTOMY  1994   left  . right port-a-cath  1994  . TONSILLECTOMY      FAMHx:  Family History  Problem Relation Age of Onset  . Heart disease Mother        CAD/MI  . Aneurysm Father        CVA  . Stroke Sister   . Hypertension Sister   . Breast cancer Unknown   . Diabetes Unknown   . Hypertension Unknown   . Cancer Brother   . Cancer Brother     SOCHx:   reports that she has never smoked. She quit smokeless tobacco use about 53 years ago. She reports that she does not drink alcohol or use drugs.  ALLERGIES:  Allergies  Allergen Reactions  . Promethazine-Codeine [Promethazine-Codeine] Nausea And Vomiting    Pt states cough syrup she can't take  . Atorvastatin Other (See Comments)    REACTION: legs ache    ROS: Pertinent items noted in HPI and remainder of comprehensive ROS otherwise negative.  HOME MEDS: Current Outpatient Prescriptions  Medication Sig Dispense Refill  . ACCU-CHEK SMARTVIEW test  strip USE ONE STRIP TO CHECK GLUCOSE ONCE DAILY 100 each 12  . albuterol (PROVENTIL HFA;VENTOLIN HFA) 108 (90 BASE) MCG/ACT inhaler Inhale 2 puffs into the lungs every 6 (six) hours as needed for wheezing or shortness of breath. 1 Inhaler 0  . amLODipine (NORVASC) 5 MG tablet TAKE 1 TABLET BY MOUTH EVERY DAY 90 tablet 1  . aspirin EC 81 MG tablet Take 81 mg by mouth daily.    . Coenzyme Q10 (COQ10) 50 MG CAPS Take by mouth.    . ezetimibe (ZETIA) 10 MG tablet Take 1 tablet (10 mg total) by mouth daily. 90 tablet 1  . fluticasone (FLONASE) 50 MCG/ACT nasal spray Place 2 sprays into both nostrils daily. 16 g 6  . metFORMIN (GLUCOPHAGE) 500 MG tablet Take 1 tablet by mouth twice daily. 180 tablet 0  . rosuvastatin (CRESTOR) 10 MG tablet Take 1 tablet (10mg ) by mouth three  times weekly 30 tablet 2  . SYMBICORT 160-4.5 MCG/ACT inhaler INHALE 2 PUFFS INTO THE LUNGS 2 (TWO) TIMES DAILY. 10.2 g 11   No current facility-administered medications for this visit.     LABS/IMAGING: No results found for this or any previous visit (from the past 48 hour(s)). No results found.  WEIGHTS: Wt Readings from Last 3 Encounters:  12/14/16 115 lb (52.2 kg)  08/18/16 117 lb 0.6 oz (53.1 kg)  06/24/16 117 lb 9.6 oz (53.3 kg)    VITALS: BP 130/70 (BP Location: Right Arm, Patient Position: Sitting, Cuff Size: Normal)   Ht 5\' 2"  (1.575 m)   Wt 115 lb (52.2 kg)   BMI 21.03 kg/m   EXAM: Deferred  EKG: Normal sinus rhythm and left bundle-branch block at 76-personally reviewed  ASSESSMENT: 1. Severely elevated cholesterol-likely FH (familial hypercholesterolemia) 2. Statin intolerance 3. Bilateral carotid bruits- mild bilateral carotid stenosis (07/2015) 4. Claudication concerning for PAD - moderate to severe PAD (07/2015) 5. Type 2 diabetes 6. Hypertension 7. LBBB 8. Progressive fatigue with exertion 9. Systolic murmur  PLAN: 1.   Diane Richmond has significant PAD with intermittent claudication that  has improved with exercise. Her cholesterol significantly improved now on statin therapy however will not tolerate increased doses. She is also on ezetimibe. Although she has not reached goal of LDL-C less than 70. I advised that we start her on Repatha. We'll coordinate this with the lipid clinic and plan a repeat lipid profile in 2-3 months, once she's had at least 2 doses of the medication.   Follow-up with me in 6 months.  Chrystie Nose, MD, [facility] Attending Cardiologist [facility]  Chrystie Nose 12/14/2016, 5:13 PM

## 2016-12-14 NOTE — Patient Instructions (Signed)
Your physician recommends that you schedule a follow-up appointment in LIPID CLINIC for Repatha  Your physician wants you to follow-up in: 6 months with Dr. Rennis Golden. You will receive a reminder letter in the mail two months in advance. If you don't receive a letter, please call our office to schedule the follow-up appointment.

## 2016-12-15 ENCOUNTER — Other Ambulatory Visit: Payer: Self-pay | Admitting: Family

## 2016-12-15 ENCOUNTER — Telehealth: Payer: Self-pay | Admitting: Family

## 2016-12-15 DIAGNOSIS — Z1231 Encounter for screening mammogram for malignant neoplasm of breast: Secondary | ICD-10-CM

## 2016-12-15 MED ORDER — BUDESONIDE-FORMOTEROL FUMARATE 160-4.5 MCG/ACT IN AERO: INHALATION_SPRAY | RESPIRATORY_TRACT | 5 refills | Status: DC

## 2016-12-15 NOTE — Telephone Encounter (Signed)
Reviewed chart pt is up-to-date sent refills to [facility]../lmb  

## 2016-12-15 NOTE — Telephone Encounter (Signed)
Pt called requesting a refill on her SYMBICORT 160-4.5 MCG/ACT inhaler to [facility] on [address]

## 2016-12-21 ENCOUNTER — Ambulatory Visit (INDEPENDENT_AMBULATORY_CARE_PROVIDER_SITE_OTHER): Payer: Medicare Other | Admitting: Pharmacist Clinician (PhC)/ Clinical Pharmacy Specialist

## 2016-12-21 DIAGNOSIS — E7801 Familial hypercholesterolemia: Secondary | ICD-10-CM

## 2016-12-21 NOTE — Assessment & Plan Note (Signed)
Patient with familial hyperlipidemia, Dutch score of 4 (3 for LDL 237, 1 for family history).  Will submit request for Repatha monthly [facility].   Patient will need to repeat cholesterol labs after 2-3 doses.  May also need to apply for patient assistance thru [employer] Safety Net, patient was given application while in office today.

## 2016-12-21 NOTE — Patient Instructions (Addendum)
We will start the paperwork to get Repatha.  If they won't cover that we may use the Praluent in the 2 week pen form  Please call if you get any letters from your insurance company approving or denying the medication  Evolocumab injection What is this medicine? EVOLOCUMAB (e voe LOK ue mab) is known as a PCSK9 inhibitor. It is used to lower the level of cholesterol in the blood. It may be used alone or in combination with other cholesterol-lowering drugs. This drug may also be used to reduce the risk of heart attack, stroke, and certain types of heart surgery in patients with heart disease. This medicine may be used for other purposes; ask your health care provider or pharmacist if you have questions. COMMON BRAND NAME(S): REPATHA What should I tell my health care provider before I take this medicine? They need to know if you have any of these conditions: -an unusual or allergic reaction to evolocumab, other medicines, foods, dyes, or preservatives -pregnant or trying to get pregnant -breast-feeding How should I use this medicine? This medicine is for injection under the skin. You will be taught how to prepare and give this medicine. Use exactly as directed. Take your medicine at regular intervals. Do not take your medicine more often than directed. It is important that you put your used needles and syringes in a special sharps container. Do not put them in a trash can. If you do not have a sharps container, call your pharmacist or health care provider to get one. Talk to your pediatrician regarding the use of this medicine in children. While this drug may be prescribed for children as young as 13 years for selected conditions, precautions do apply. Overdosage: If you think you have taken too much of this medicine contact a poison control center or emergency room at once. NOTE: This medicine is only for you. Do not share this medicine with others. What if I miss a dose? If you miss a dose, take  it as soon as you can if there are more than 7 days until the next scheduled dose, or skip the missed dose and take the next dose according to your original schedule. Do not take double or extra doses. What may interact with this medicine? Interactions are not expected. This list may not describe all possible interactions. Give your health care provider a list of all the medicines, herbs, non-prescription drugs, or dietary supplements you use. Also tell them if you smoke, drink alcohol, or use illegal drugs. Some items may interact with your medicine. What should I watch for while using this medicine? You may need blood work while you are taking this medicine. What side effects may I notice from receiving this medicine? Side effects that you should report to your doctor or health care professional as soon as possible: -allergic reactions like skin rash, itching or hives, swelling of the face, lips, or tongue -signs and symptoms of infection like fever or chills; cough; sore throat; pain or trouble passing urine Side effects that usually do not require medical attention (report to your doctor or health care professional if they continue or are bothersome): -diarrhea -nausea -muscle pain -pain, redness, or irritation at site where injected This list may not describe all possible side effects. Call your doctor for medical advice about side effects. You may report side effects to FDA at 1-800-FDA-1088. Where should I keep my medicine? Keep out of the reach of children. You will be instructed on how to  store this medicine. Throw away any unused medicine after the expiration date on the label. NOTE: This sheet is a summary. It may not cover all possible information. If you have questions about this medicine, talk to your doctor, pharmacist, or health care provider.  2018 Elsevier/Gold Standard (2016-03-28 13:21:53)

## 2016-12-21 NOTE — Progress Notes (Signed)
12/21/2016 Diane Richmond February 03, 1946 [id number]   HPI:  Diane Richmond is a 71 y.o. female patient of Dr Rennis Golden, who presents today for a lipid clinic evaluation.  Her medical history is significant for hypertension, hyperlipidemia, PAD, diabetes and asthma.  She also has a history of breast cancer (1994).  She has been able to tolerate rosuvastatin 10 mg daily which has shown a marked improvement.  Her LDL dropped from 237 to 125.  We discussed at length how PCSK-9 inhibitors work and that this would be in addition to her rosuvastatin and ezetimibe.  Patient would prefer to use Repatha P[facility]htronix as she is unsure she could do the [employer].   She has asked that we use her daughter Diane Richmond ([phone]) as a contact point in dealing with insurance and pharmacy issues related to the Repatha.    Current Medications:  Rosuvastatin 10 started in spring  Ezetimibe 10 mg   Cholesterol Goals:   No tobacco, no alcohol;  Intolerant/previously tried:  Atorvastatin 40 and 80 mg doses caused myalgias  Rosuvastatin doses higher than 10 mg cause myalgias.  Does well at 10 mg daily  Family history:   Father died from stroke at age 33  Mother died from MI at 21  Sister died from stroke in her early 10's   Diet:   Mostly home cooked meals; baked or slow cooked, with plenty of vegetables (canned or frozen); drinks tea, coffee and ginger ale, knows she she should drink more water  Exercise:    Yard work, walking, [employer] on arms and legs Labs:  Results for Diane Richmond (MRN [id number]) as of 12/21/2016 19:05  Ref. Range 05/2015 12/07/2016 10:26  Total CHOL/HDL Ratio Latest Ref Range: <5.0 Ratio  4.7  Cholesterol Latest Ref Range: <200 mg/dL 865 784  HDL Cholesterol Latest Ref Range: >50 mg/dL 31 37 (L)  LDL (calc) Latest Ref Range: <100 mg/dL 696 295 (H)  Triglycerides Latest Ref Range: <150 mg/dL 284 63  VLDL Latest Ref Range: <30 mg/dL 13.2 13    Current Outpatient Prescriptions   Medication Sig Dispense Refill  . ACCU-CHEK SMARTVIEW test strip USE ONE STRIP TO CHECK GLUCOSE ONCE DAILY 100 each 12  . albuterol (PROVENTIL HFA;VENTOLIN HFA) 108 (90 BASE) MCG/ACT inhaler Inhale 2 puffs into the lungs every 6 (six) hours as needed for wheezing or shortness of breath. 1 Inhaler 0  . amLODipine (NORVASC) 5 MG tablet TAKE 1 TABLET BY MOUTH EVERY DAY 90 tablet 1  . aspirin EC 81 MG tablet Take 81 mg by mouth daily.    . budesonide-formoterol (SYMBICORT) 160-4.5 MCG/ACT inhaler INHALE 2 PUFFS INTO THE LUNGS 2 (TWO) TIMES DAILY. 10.2 g 5  . Coenzyme Q10 (COQ10) 50 MG CAPS Take by mouth.    . ezetimibe (ZETIA) 10 MG tablet Take 1 tablet (10 mg total) by mouth daily. 90 tablet 1  . fluticasone (FLONASE) 50 MCG/ACT nasal spray Place 2 sprays into both nostrils daily. 16 g 6  . metFORMIN (GLUCOPHAGE) 500 MG tablet Take 1 tablet by mouth twice daily. 180 tablet 0  . rosuvastatin (CRESTOR) 10 MG tablet Take 1 tablet (10mg ) by mouth three times weekly 30 tablet 2   No current facility-administered medications for this visit.     Allergies  Allergen Reactions  . Promethazine-Codeine [Promethazine-Codeine] Nausea And Vomiting    Pt states cough syrup she can't take  . Atorvastatin Other (See Comments)    REACTION: legs ache    Past  Medical History:  Diagnosis Date  . Asthma   . Bronchitis   . Diabetes mellitus    type II  . HX: breast cancer   . Hyperlipidemia   . Hypertension     There were no vitals taken for this visit.   Familial hypercholesteremia Patient with familial hyperlipidemia, Dutch score of 4 (3 for LDL 237, 1 for family history).  Will submit request for Repatha monthly [facility].   Patient will need to repeat cholesterol labs after 2-3 doses.  May also need to apply for patient assistance thru [employer] Safety Net, patient was given application while in office today.   Phillips HayKristin Alvstad PharmD CPP [facility]

## 2016-12-23 ENCOUNTER — Other Ambulatory Visit: Payer: Self-pay | Admitting: Pharmacist Clinician (PhC)/ Clinical Pharmacy Specialist

## 2016-12-23 MED ORDER — EVOLOCUMAB WITH INFUSOR 420 MG/3.5ML ~~LOC~~ SOCT: 420.0000 mg | SUBCUTANEOUS | 3 refills | Status: DC

## 2017-01-18 ENCOUNTER — Ambulatory Visit
Admission: RE | Admit: 2017-01-18 | Discharge: 2017-01-18 | Disposition: A | Discharge status: Home / Self Care | Payer: Medicare Other | Source: Ambulatory Visit | Attending: Family | Admitting: Family

## 2017-01-18 ENCOUNTER — Other Ambulatory Visit: Payer: Self-pay | Admitting: Internal Medicine

## 2017-01-18 DIAGNOSIS — Z1231 Encounter for screening mammogram for malignant neoplasm of breast: Secondary | ICD-10-CM | POA: Diagnosis not present

## 2017-02-01 ENCOUNTER — Telehealth: Payer: Self-pay | Admitting: Pharmacist

## 2017-02-01 ENCOUNTER — Telehealth: Payer: Self-pay | Admitting: Family

## 2017-02-01 MED ORDER — FLUTICASONE PROPIONATE 50 MCG/ACT NA SUSP: 2.0000 | Freq: Every day | NASAL | 6 refills | Status: DC

## 2017-02-01 MED ORDER — ALBUTEROL SULFATE HFA 108 (90 BASE) MCG/ACT IN AERS: 2.0000 | INHALATION_SPRAY | Freq: Four times a day (QID) | RESPIRATORY_TRACT | 0 refills | Status: DC | PRN

## 2017-02-01 NOTE — Telephone Encounter (Signed)
Patient received Repatha and will like to come to office for assistance during 1st dose administration.  LMOM; patient can come tomorrow 10/11 at 12 (noon) or Monday 10/15 at 8:30am of 12 (noon).  No appointment needed but call to confirm requested.

## 2017-02-01 NOTE — Telephone Encounter (Signed)
Rx has been sent  

## 2017-02-01 NOTE — Telephone Encounter (Signed)
Pt is needing a refill on fluticasone (FLONASE) 50 MCG/ACT nasal spray and albuterol (PROVENTIL HFA;VENTOLIN HFA) 108 (90 BASE) MCG/ACT inhaler to be sent to [facility].

## 2017-02-08 ENCOUNTER — Telehealth: Payer: Self-pay | Admitting: Pharmacist Clinician (PhC)/ Clinical Pharmacy Specialist

## 2017-02-10 NOTE — Telephone Encounter (Signed)
First dose Repatha Pushtronix given in office today.  Patient self administered with assistance.  All questions answered.   Will repeat labs after 3rd dose.

## 2017-03-02 ENCOUNTER — Other Ambulatory Visit: Payer: Self-pay | Admitting: *Deleted

## 2017-03-02 MED ORDER — ALBUTEROL SULFATE HFA 108 (90 BASE) MCG/ACT IN AERS: 2.0000 | INHALATION_SPRAY | Freq: Four times a day (QID) | RESPIRATORY_TRACT | 0 refills | Status: DC | PRN

## 2017-03-02 NOTE — Addendum Note (Signed)
Addended by: Deatra JamesBRAND, Nyshaun Standage M on: 03/02/2017 02:38 PM   Modules accepted: Orders

## 2017-03-22 ENCOUNTER — Other Ambulatory Visit: Payer: Self-pay | Admitting: Internal Medicine

## 2017-03-22 ENCOUNTER — Other Ambulatory Visit: Payer: Self-pay | Admitting: *Deleted

## 2017-03-22 DIAGNOSIS — I10 Essential (primary) hypertension: Secondary | ICD-10-CM

## 2017-03-22 MED ORDER — AMLODIPINE BESYLATE 5 MG PO TABS: ORAL_TABLET | ORAL | 0 refills | Status: DC

## 2017-03-22 NOTE — Telephone Encounter (Signed)
1st script printed, re-faxed rx in epic...Raechel Chute/lmb

## 2017-03-23 ENCOUNTER — Telehealth: Payer: Self-pay | Admitting: Internal Medicine

## 2017-03-23 MED ORDER — ROSUVASTATIN CALCIUM 10 MG PO TABS: ORAL_TABLET | ORAL | 1 refills | Status: DC

## 2017-03-23 NOTE — Telephone Encounter (Signed)
Patient daughter calling, states that patient is running out of her Rosuvastatin medication a lot sooner than she should. Diane Richmond states that patient received a quantity of 39 pills and was told that it was ordered by our office.   Diane Richmond is confused and states that the quality of medication does not equal up.

## 2017-03-23 NOTE — Telephone Encounter (Signed)
Rena Siler/daughter(DPR) she states that pt has been been taking rosuvastatin daily and not 3 times a week. So she need to have this filled early. She will take 3 times a week now. Refill sent to mail-in with note above.

## 2017-04-12 ENCOUNTER — Other Ambulatory Visit: Payer: Self-pay | Admitting: *Deleted

## 2017-04-12 ENCOUNTER — Telehealth: Payer: Self-pay | Admitting: Family

## 2017-04-12 NOTE — Telephone Encounter (Signed)
Request for Metformin refill. Last OV 08/18/16. Last refill on 11/29/16. Former pt of [employer] Calone,NP

## 2017-04-12 NOTE — Telephone Encounter (Signed)
Copied from CRM [phone]. Topic: Quick Communication - See Telephone Encounter >> Apr 12, 2017  2:15 PM Terisa Starraylor, Brittany L wrote: CRM for notification. See Telephone encounter for:   04/12/17.  Requesting refill on the Metformin 500mg . Pharmacy is [facility]

## 2017-04-12 NOTE — Telephone Encounter (Signed)
Pt has not made appt w/new provider yet. Was told last refill that she need to make appt w/new provider. Per Tammy SoursGreg last OV 07/2016 she was to return back in Oct. Pls make appt w/new provider for renewal.../lmb

## 2017-04-13 MED ORDER — METFORMIN HCL 500 MG PO TABS: ORAL_TABLET | ORAL | 0 refills | Status: DC

## 2017-04-13 NOTE — Telephone Encounter (Signed)
Pt appt was made for 06/11/17 Per office policy sent enough medication [facility] until appt...Raechel Chute/lmb

## 2017-04-13 NOTE — Telephone Encounter (Signed)
Appt made

## 2017-05-25 ENCOUNTER — Other Ambulatory Visit: Payer: Self-pay | Admitting: Nurse Practitioner

## 2017-05-25 DIAGNOSIS — I10 Essential (primary) hypertension: Secondary | ICD-10-CM

## 2017-06-01 ENCOUNTER — Other Ambulatory Visit: Payer: Self-pay | Admitting: Nurse Practitioner

## 2017-06-21 ENCOUNTER — Other Ambulatory Visit (INDEPENDENT_AMBULATORY_CARE_PROVIDER_SITE_OTHER): Payer: Medicare Other

## 2017-06-21 ENCOUNTER — Encounter: Payer: Self-pay | Admitting: Nurse Practitioner

## 2017-06-21 ENCOUNTER — Ambulatory Visit: Payer: Medicare Other | Admitting: Nurse Practitioner

## 2017-06-21 VITALS — BP 160/85 | HR 77 | Temp 98.7°F | Resp 16 | Ht 62.0 in | Wt 116.0 lb

## 2017-06-21 DIAGNOSIS — I10 Essential (primary) hypertension: Secondary | ICD-10-CM

## 2017-06-21 DIAGNOSIS — E119 Type 2 diabetes mellitus without complications: Secondary | ICD-10-CM

## 2017-06-21 DIAGNOSIS — J4520 Mild intermittent asthma, uncomplicated: Secondary | ICD-10-CM | POA: Diagnosis not present

## 2017-06-21 LAB — BASIC METABOLIC PANEL
BUN: 16 mg/dL (ref 6–23)
CALCIUM: 9.9 mg/dL (ref 8.4–10.5)
CO2: 32 meq/L (ref 19–32)
CREATININE: 0.64 mg/dL (ref 0.40–1.20)
Chloride: 104 mEq/L (ref 96–112)
GFR: 117.58 mL/min (ref >60.00)
Glucose, Bld: 78 mg/dL (ref 70–99)
Potassium: 3.7 mEq/L (ref 3.5–5.1)
Sodium: 140 mEq/L (ref 135–145)

## 2017-06-21 LAB — HEMOGLOBIN A1C: Hgb A1c MFr Bld: 6 % (ref 4.6–6.5)

## 2017-06-21 NOTE — Assessment & Plan Note (Signed)
BP reading elevated today. She feels that her elevated blood pressure Is related to increased stressful situation at home currently and lack of good sleep, this stressful situation should improve soon and then she will be able to get back to her normal routine and sleep schedule She has also been adding a new flavored salt to meals when cooking. She prefers not to adjust blood pressure medications today but to wait until her stress decreases and return in about 2-3 weeks for follow up We discussed purchasing a home blood pressure cuff to monitor readings with goal of BP less than 140/90 consistently, also discussed role of healthy low sodium diet in the reduction of blood pressure -See AVS for education provided to patient  - Basic metabolic panel; Future

## 2017-06-21 NOTE — Patient Instructions (Addendum)
Please head downstairs for lab work.  Please try to check your blood pressure once daily or at least a few times a week, at the same time each day, and keep a log. Work on eating a healthy low salt diet and come back in about 2-3 weeks with your blood pressure log so we can follow up on your blood pressure.  I have placed a referral to podiatry. Our office will call you to schedule this appointment. You should hear from our office in 7-10 days.  It was good to meet you. Thanks for letting me take care of you today :)   Mediterranean Diet A Mediterranean diet refers to food and lifestyle choices that are based on the traditions of countries located on the [employer] [place]. This way of eating has been shown to help prevent certain conditions and improve outcomes for people who have chronic diseases, like kidney disease and heart disease. What are tips for following this plan? Lifestyle  Cook and eat meals together with your family, when possible.  Drink enough fluid to keep your urine clear or pale yellow.  Be physically active every day. This includes: ? Aerobic exercise like running or swimming. ? Leisure activities like gardening, walking, or housework.  Get 7-8 hours of sleep each night.  If recommended by your health care provider, drink red wine in moderation. This means 1 glass a day for nonpregnant women and 2 glasses a day for men. A glass of wine equals 5 oz (150 mL). Reading food labels  Check the serving size of packaged foods. For foods such as rice and pasta, the serving size refers to the amount of cooked product, not dry.  Check the total fat in packaged foods. Avoid foods that have saturated fat or trans fats.  Check the ingredients list for added sugars, such as corn syrup. Shopping  At the grocery store, buy most of your food from the areas near the walls of the store. This includes: ? Fresh fruits and vegetables (produce). ? Grains, beans, nuts, and seeds. Some  of these may be available in unpackaged forms or large amounts (in bulk). ? Fresh seafood. ? Poultry and eggs. ? Low-fat dairy products.  Buy whole ingredients instead of prepackaged foods.  Buy fresh fruits and vegetables in-season from local farmers markets.  Buy frozen fruits and vegetables in resealable bags.  If you do not have access to quality fresh seafood, buy precooked frozen shrimp or canned fish, such as tuna, salmon, or sardines.  Buy small amounts of raw or cooked vegetables, salads, or olives from the deli or salad bar at your store.  Stock your pantry so you always have certain foods on hand, such as olive oil, canned tuna, canned tomatoes, rice, pasta, and beans. Cooking  Cook foods with extra-virgin olive oil instead of using butter or other vegetable oils.  Have meat as a side dish, and have vegetables or grains as your main dish. This means having meat in small portions or adding small amounts of meat to foods like pasta or stew.  Use beans or vegetables instead of meat in common dishes like chili or lasagna.  Experiment with different cooking methods. Try roasting or broiling vegetables instead of steaming or sauteing them.  Add frozen vegetables to soups, stews, pasta, or rice.  Add nuts or seeds for added healthy fat at each meal. You can add these to yogurt, salads, or vegetable dishes.  Marinate fish or vegetables using olive oil, lemon juice,  garlic, and fresh herbs. Meal planning  Plan to eat 1 vegetarian meal one day each week. Try to work up to 2 vegetarian meals, if possible.  Eat seafood 2 or more times a week.  Have healthy snacks readily available, such as: ? Vegetable sticks with hummus. ? [place] yogurt. ? Fruit and nut trail mix.  Eat balanced meals throughout the week. This includes: ? Fruit: 2-3 servings a day ? Vegetables: 4-5 servings a day ? Low-fat dairy: 2 servings a day ? Fish, poultry, or lean meat: 1 serving a day ? Beans and  legumes: 2 or more servings a week ? Nuts and seeds: 1-2 servings a day ? Whole grains: 6-8 servings a day ? Extra-virgin olive oil: 3-4 servings a day  Limit red meat and sweets to only a few servings a month What are my food choices?  Mediterranean diet ? Recommended ? Grains: Whole-grain pasta. Brown rice. Bulgar wheat. Polenta. Couscous. Whole-wheat bread. [place] Cobbatmeal. Quinoa. ? Vegetables: Artichokes. Beets. Broccoli. Cabbage. Carrots. Eggplant. Green beans. Chard. Kale. Spinach. Onions. Leeks. Peas. Squash. Tomatoes. Peppers. Radishes. ? Fruits: Apples. Apricots. Avocado. Berries. Bananas. Cherries. Dates. Figs. Grapes. Lemons. Melon. Oranges. Peaches. Plums. Pomegranate. ? Meats and other protein foods: Beans. Almonds. Sunflower seeds. Pine nuts. Peanuts. Cod. Salmon. Scallops. Shrimp. Tuna. Tilapia. Clams. Oysters. Eggs. ? Dairy: Low-fat milk. Cheese. Greek yogurt. ? Beverages: Water. Red wine. Herbal tea. ? Fats and oils: Extra virgin olive oil. Avocado oil. Grape seed oil. ? Sweets and desserts: [place] yogurt with honey. Baked apples. Poached pears. Trail mix. ? Seasoning and other foods: Basil. Cilantro. Coriander. Cumin. Mint. Parsley. Sage. Rosemary. Tarragon. Garlic. Oregano. Thyme. Pepper. Balsalmic vinegar. Tahini. Hummus. Tomato sauce. Olives. Mushrooms. ? Limit these ? Grains: Prepackaged pasta or rice dishes. Prepackaged cereal with added sugar. ? Vegetables: Deep fried potatoes (french fries). ? Fruits: Fruit canned in syrup. ? Meats and other protein foods: Beef. Pork. Lamb. Poultry with skin. Hot dogs. Tomasa BlaseBacon. ? Dairy: Ice cream. Sour cream. Whole milk. ? Beverages: Juice. Sugar-sweetened soft drinks. Beer. Liquor and spirits. ? Fats and oils: Butter. Canola oil. Vegetable oil. Beef fat (tallow). Lard. ? Sweets and desserts: Cookies. Cakes. Pies. Candy. ? Seasoning and other foods: Mayonnaise. Premade sauces and marinades. ? The items listed may not be a complete list.  Talk with your dietitian about what dietary choices are right for you. Summary  The Mediterranean diet includes both food and lifestyle choices.  Eat a variety of fresh fruits and vegetables, beans, nuts, seeds, and whole grains.  Limit the amount of red meat and sweets that you eat.  Talk with your health care provider about whether it is safe for you to drink red wine in moderation. This means 1 glass a day for nonpregnant women and 2 glasses a day for men. A glass of wine equals 5 oz (150 mL). This information is not intended to replace advice given to you by your health care provider. Make sure you discuss any questions you have with your health care provider. Document Released: 12/03/2015 Document Revised: 01/05/2016 Document Reviewed: 12/03/2015 Elsevier Interactive Patient Education  [employer]2018 Elsevier Inc.

## 2017-06-21 NOTE — Progress Notes (Signed)
Name: Diane Richmond   MRN: [id number]    DOB: 01-29-1946   Date:06/21/2017       Progress Note  Subjective  Chief Complaint  Chief Complaint  Patient presents with  . Establish Care    HPI Diane Richmond is establishing care with me today. Aside from her primary care needs, she follows with cardiology for severely elevated cholesterol, left bundle branch block, and bilateral carotid bruits.  Asthma-maintained on symbicort 160-4.5 bid, albuterol prn. She says she has been on this same dosage of symbicort for some time with no noted adverse effects. She does not recall the last time she needed to use her aluterol. She rarely experiences shortness of breath, about once a month she may have some occasional shortness of breath. She occasionally experiences a dry cough. She denies fevers, weakness, nighttime awakenings. She also uses flonase prn for nasal congestion  Diabetes- maintained on metformin 500 BID Reports daily medication compliance without adverse medication effects. Reports home blood sugar readings ranging from upper 80s to 150 recently. On some mornings, she skips breakfast and will notice that she feels "a little cold and shaky"- this is when she has glucose readings in the 80s. Denies polyuria, polydipsia, polyphagia.  Lab Results  Component Value Date   HGBA1C 6.2 09/06/2016    Hypertension -maintained on amlodipine 5 daily Reports daily medication compliance without adverse medication effects. She does not routinely check her blood pressure at home. She denies headaches, vision changes, chest pain, edema. She sometimes experiences a sensation of pressure in her posterior neck which she attributes to elevated blood pressure. Today her blood pressure reading is elevated. She says she has had increased stress at home and has not been sleeping well.  BP Readings from Last 3 Encounters:  06/21/17 (!) 160/85  12/14/16 130/70  08/18/16 (!) 150/70   . Patient Active  Problem List   Diagnosis Date Noted  . PAD (peripheral artery disease) (HCC) 01/29/2016  . Bilateral carotid bruits 07/31/2015  . Claudication (HCC) 07/31/2015  . Medicare annual wellness visit, subsequent 06/11/2015  . Acute bronchitis 04/02/2012  . ASVD (arteriosclerotic vascular disease) 03/04/2012  . Allergic rhinitis, cause unspecified 09/01/2011  . Routine general medical examination at a health care facility 09/01/2011  . Other screening mammogram 09/01/2011  . LBBB (left bundle branch block) 05/25/2011  . Controlled type 2 diabetes mellitus without complication, without long-term current use of insulin (HCC) 03/12/2010  . Familial hypercholesteremia 03/12/2010  . Essential hypertension 03/12/2010  . Asthma 03/12/2010    Past Surgical History:  Procedure Laterality Date  . BREAST LUMPECTOMY  1994   left  . right port-a-cath  1994  . TONSILLECTOMY      Family History  Problem Relation Age of Onset  . Heart disease Mother        CAD/MI  . Aneurysm Father        CVA  . Stroke Sister   . Hypertension Sister   . Breast cancer Unknown   . Diabetes Unknown   . Hypertension Unknown   . Cancer Brother   . Cancer Brother     Social History   Socioeconomic History  . Marital status: Married    Spouse name: Not on file  . Number of children: 4  . Years of education: 8  . Highest education level: Not on file  Social Needs  . Financial resource strain: Not on file  . Food insecurity - worry: Not on file  . Food insecurity -  inability: Not on file  . Transportation needs - medical: Not on file  . Transportation needs - non-medical: Not on file  Occupational History  . Occupation: Chief Strategy Officernurse assistant - retired  Tobacco Use  . Smoking status: Never Smoker  . Smokeless tobacco: Former Engineer, waterUser  Substance and Sexual Activity  . Alcohol use: No  . Drug use: No  . Sexual activity: Not Currently  Other Topics Concern  . Not on file  Social History Narrative   8th grade.  Married '67 - .  4 dtrs - '67, '70, '72, '78. Grandchildren - 4. 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 Lives with husband.    Denies abuse and feels safe at home.      Current Outpatient Medications:  .  ACCU-CHEK SMARTVIEW test strip, USE ONE STRIP TO CHECK GLUCOSE ONCE DAILY, Disp: 100 each, Rfl: 12 .  albuterol (PROVENTIL HFA;VENTOLIN HFA) 108 (90 Base) MCG/ACT inhaler, Inhale 2 puffs every 6 (six) hours as needed into the lungs for wheezing or shortness of breath. Must make appt w/new provider for future refills, Disp: 1 Inhaler, Rfl: 0 .  amLODipine (NORVASC) 5 MG tablet, TAKE 1 TABLET BY MOUTH  EVERY DAY, Disp: 90 tablet, Rfl: 0 .  aspirin EC 81 MG tablet, Take 81 mg by mouth daily., Disp: , Rfl:  .  budesonide-formoterol (SYMBICORT) 160-4.5 MCG/ACT inhaler, INHALE 2 PUFFS INTO THE LUNGS 2 (TWO) TIMES DAILY., Disp: 10.2 g, Rfl: 5 .  Coenzyme Q10 (COQ10) 50 MG CAPS, Take by mouth., Disp: , Rfl:  .  Evolocumab with Infusor (REPATHA PUSHTRONEX SYSTEM) 420 MG/3.5ML SOCT, Inject 420 mg into the skin every 30 (thirty) days., Disp: 3 Cartridge, Rfl: 3 .  ezetimibe (ZETIA) 10 MG tablet, TAKE 1 TABLET BY MOUTH  DAILY, Disp: 90 tablet, Rfl: 3 .  fluticasone (FLONASE) 50 MCG/ACT nasal spray, Place 2 sprays into both nostrils daily., Disp: 16 g, Rfl: 6 .  metFORMIN (GLUCOPHAGE) 500 MG tablet, TAKE 1 TABLET BY MOUTH  TWICE A DAY, Disp: 180 tablet, Rfl: 0 .  rosuvastatin (CRESTOR) 10 MG tablet, TAKE 1 TABLET BY MOUTH  THREE TIMES WEEKLY, Disp: 39 tablet, Rfl: 1 .  ezetimibe (ZETIA) 10 MG tablet, Take 1 tablet (10 mg total) by mouth daily., Disp: 90 tablet, Rfl: 1  Allergies  Allergen Reactions  . Promethazine-Codeine [Promethazine-Codeine] Nausea And Vomiting    Pt states cough syrup she can't take  . Atorvastatin Other (See Comments)    REACTION: legs ache     ROS See  HPI  Objective  Vitals:   06/21/17 1305 06/21/17 1340  BP: (!) 178/88 (!) 160/85  Pulse: 77   Resp: 16   Temp: 98.7 F (37.1 C)    TempSrc: Oral   SpO2: 95%   Weight: 116 lb (52.6 kg)   Height: 5\' 2"  (1.575 m)     Body mass index is 21.22 kg/m.  Physical Exam Vital signs reviewed. Constitutional: Patient appears well-developed and well-nourished. No distress.  HENT: Head: Normocephalic and atraumatic. Nose: Nose normal. Mouth/Throat: Oropharynx is clear and moist. Eyes: Conjunctivae and EOM are normal. No scleral icterus.  Neck: Normal range of motion. Neck supple.  Cardiovascular: Normal rate, regular rhythm. No BLE edema. Distal pulses intact. Pulmonary/Chest: Effort normal and breath sounds normal. No respiratory distress. Abdominal: Soft. No distension. Musculoskeletal: Normal range of motion, no joint effusions. No gross deformities Neurological: She is alert and oriented to person, place, and time. Coordination, balance, strength, speech and gait are normal.  Skin: Skin is warm and dry. No rash noted. No erythema.  Psychiatric: Patient has a normal mood and affect. Behavior is normal. Judgment and thought content normal.  Diabetic Foot Exam: Diabetic Foot Exam - Simple   Simple Foot Form Visual Inspection See comments:  Yes Sensation Testing Intact to touch and monofilament testing bilaterally:  Yes Pulse Check Posterior Tibialis and Dorsalis pulse intact bilaterally:  Yes Comments Callused toes reddened skin to right fifth toe    PHQ2/9: Depression screen [facility] 2/9 06/21/2017 10/17/2013  Decreased Interest 0 0  Down, Depressed, Hopeless 0 1  PHQ - 2 Score 0 1    Fall Risk: Fall Risk  06/21/2017 10/17/2013  Falls in the past year? No No    Assessment & Plan RTC in about 2 weeks for blood pressure follow up  -Reviewed Health Maintenance: Foot Exam, A1c today

## 2017-06-21 NOTE — Assessment & Plan Note (Signed)
Stable, continue current medications.  

## 2017-06-21 NOTE — Assessment & Plan Note (Signed)
Continue metformin, update A1c today We discussed importance of eating three balanced meals a day with healthy snacks. She will continue to monitor blood sugars and follow up for readings below 80. - Hemoglobin A1c; Future - HM DIABETES FOOT EXAM; Future - Ambulatory referral to Podiatry-multiple calluses and some redness to toes from tight fitting shoes. We discussed the importance of routinely checking feet for lesions and wearing supportive shoes at all times, avoiding going barefoot to prevent injuries to feet.

## 2017-06-29 ENCOUNTER — Encounter: Payer: Self-pay | Admitting: Internal Medicine

## 2017-06-29 ENCOUNTER — Ambulatory Visit: Payer: Medicare Other | Admitting: Internal Medicine

## 2017-06-29 VITALS — BP 158/68 | HR 74 | Ht 62.0 in | Wt 116.0 lb

## 2017-06-29 DIAGNOSIS — R0989 Other specified symptoms and signs involving the circulatory and respiratory systems: Secondary | ICD-10-CM

## 2017-06-29 DIAGNOSIS — I739 Peripheral vascular disease, unspecified: Secondary | ICD-10-CM

## 2017-06-29 DIAGNOSIS — I447 Left bundle-branch block, unspecified: Secondary | ICD-10-CM

## 2017-06-29 DIAGNOSIS — I10 Essential (primary) hypertension: Secondary | ICD-10-CM

## 2017-06-29 DIAGNOSIS — I7090 Unspecified atherosclerosis: Secondary | ICD-10-CM

## 2017-06-29 DIAGNOSIS — E7801 Familial hypercholesterolemia: Secondary | ICD-10-CM

## 2017-06-29 NOTE — Patient Instructions (Signed)
Schedule [facility] appointment 1 month    Schedule appointment with Dr.Joseph

## 2017-06-29 NOTE — Progress Notes (Signed)
OFFICE NOTE  Chief Complaint:  No complaints  Primary Care Physician: Evaristo BuryShambley, Ashleigh N, NP  HPI:  Diane Richmond is a 72 y.o. female who is currently referred to me by Charlann NossGreg Cologne, FNP, for evaluation of elevated cholesterol. I reviewed several years of cholesterol data as of 2013. Currently her total cholesterol is 302 which compares to a total cholesterol 266 3 years ago. Her triglycerides are 167, HDL 31, LDL 237, and non-HDL cholesterol of 270. She had a direct LDL over 200 several years ago. In addition she has type 2 diabetes, hypertension, asthma, and an abnormal EKG demonstrating left bundle branch block. She denies any chest pain or worsening shortness of breath but has had some worsening fatigue and decreased exercise tolerance, particularly over the last 6 months. Her family history is significant for mother who died of massive heart attack at age 72 and father who had a stroke and died at age 72. She also had a sister who died of stroke. In the past she had been on Lipitor but had significant myalgias with this and was switched to Crestor 20 mg daily. She said this also bothered her and lower dose was recommended. Blood pressure is up today at 162/86 but she reports being nervous. She's never been a smoker and denies alcohol use. Her sleepiness score was 0. She mentions that she does get pain in her legs when walking certain distances that gets better when she rests.  01/29/2016  Diane RhodesBetty returns today for follow-up. Unfortunately she did not make routine follow-up and we were unable to contact her for several months. We did relay the results of her extensive testing by mail. She was scheduled for follow-up appointment which she did not make and a referral to my partner which she canceled. She returns today continues to have symptoms concerning for claudication. She underwent a nuclear stress test which was negative for ischemia and demonstrated normal LV function with an EF of 64%.  Echocardiogram also showed an EF of 60-65%, grade 1 diastolic dysfunction, mild TR and PI with an RVSP of 31 mmHg. She had carotid Dopplers which showed mild bilateral carotid artery stenosis. Of note, she did have lower extremity arterial Dopplers which showed moderate to severe bilateral lower extremity arterial insufficiency. ABIs were 0.24 and the right and 0.4 on the left. Based on these findings I referred her to Dr. Allyson SabalBerry for evaluation and possible angiogram however she did not make this appointment. I also recommended additional lab work including an extended lipid profile as she has been intolerant to statins. We had to decrease her Crestor to 10 mg daily which she seems to be tolerating along with ezetimibe. She was being considered for Repatha and this will need to be reinitiated. 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  06/24/2016  Diane Richmond returns today for follow-up. She reports she does get some pain in her legs when she walks but is not lifestyle limiting at this point. She recently saw Dr. Allyson SabalBerry who talked about lower extremity angiogram however she declined and wanted to see how she did with medical therapy. Unfortunate she's not even taking daily aspirin. I highly encouraged that today. In addition we've been working on lowering cholesterol. She's currently only able to tolerate Crestor 10 mg alternating with 20 mg and then 10 mg 3 times a week. I would like to recheck her cholesterol to see where she is at currently. The plan is if she is not at goal LDL less  than 70 and non-HDL cholesterol less than 956, then we would need to consider adding medication such neuropathic. There is clearly a survival benefit in patients as well as a decreased risk of critical limb ischemia that was demonstrated in the FOURIER trial.  12/14/2016  Diane Richmond returns today for follow-up. We repeated her lipid profile and pleased to say she's had marked improvement. Total cholesterol has decreased from 300  to 175 over the past year. Triglycerides are down from 167-63. HDL is increased from 31-37 and LDL-C has decreased from 237-125. She's currently on ezetimibe 10 mg daily and rosuvastatin 10 mg daily. She reports she cannot take increased doses of rosuvastatin secondary to myalgias and was completely intolerant of even low doses of atorvastatin. Although she's had marked and significant improvement in her LDL cholesterol, her goal LDL-C is less than 70 given the fact she has significant peripheral arterial disease with decreased ABIs. I discussed this with her at length today and feel that she is a good candidate for PCSK9 inhibitor. We discussed options and she felt that once monthly dosing with her path via the pump is the best option for her.  06/29/2017  Diane Richmond returns today for follow-up.  Overall she seems to be doing well.  She is noted recently that her blood pressure was elevated.  Her daughter brought her blood pressure cuff.  Fortunately she was recently started on Repatha.  She says she took her last dose in January, but there was a problem with renewing the medication and she did not have a dose in February.  Prior to that she had a marked reduction as previously described in her LDL cholesterol to 125.  This is still much higher than her target less than 70 however marks a significant reduction from her LDL cholesterol which was greater than 300.  Her most recent labs in November 2018 showed total cholesterol 143, LDL 58, triglycerides 241 and HDL 37.  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  In addition, we discussed this further today.  She does have 4 children, 3 of which have had difficulty with elevated cholesterol.  Her daughter, who accompanied her today said it is not an issue for her.  We discussed the possibility of genetic screening as she has a number of grandchildren and even a great grandchild who certainly could be at risk for genetic dyslipidemia.   She denies any new symptoms of claudication, chest pain or worsening shortness of breath.  Blood pressure at home has been in the 140s and 150s and was noted to be 158 systolic today.  PMHx:  Past Medical History:  Diagnosis Date  . Asthma   . Bronchitis   . Diabetes mellitus    type II  . HX: breast cancer   . Hyperlipidemia   . Hypertension     Past Surgical History:  Procedure Laterality Date  . BREAST LUMPECTOMY  1994   left  . right port-a-cath  1994  . TONSILLECTOMY      FAMHx:  Family History  Problem Relation Age of Onset  . Heart disease Mother        CAD/MI  . Aneurysm Father        CVA  . Stroke Sister   . Hypertension Sister   . Breast cancer Unknown   . Diabetes Unknown   . Hypertension Unknown   . Cancer Brother   . Cancer Brother     SOCHx:   reports that  has  never smoked. She quit smokeless tobacco use about 54 years ago. She reports that she does not drink alcohol or use drugs.  ALLERGIES:  Allergies  Allergen Reactions  . Promethazine-Codeine [Promethazine-Codeine] Nausea And Vomiting    Pt states cough syrup she can't take  . Atorvastatin Other (See Comments)    REACTION: legs ache    ROS: Pertinent items noted in HPI and remainder of comprehensive ROS otherwise negative.  HOME MEDS: Current Outpatient Medications  Medication Sig Dispense Refill  . ACCU-CHEK SMARTVIEW test strip USE ONE STRIP TO CHECK GLUCOSE ONCE DAILY 100 each 12  . albuterol (PROVENTIL HFA;VENTOLIN HFA) 108 (90 Base) MCG/ACT inhaler Inhale 2 puffs every 6 (six) hours as needed into the lungs for wheezing or shortness of breath. Must make appt w/new provider for future refills 1 Inhaler 0  . amLODipine (NORVASC) 5 MG tablet TAKE 1 TABLET BY MOUTH  EVERY DAY 90 tablet 0  . aspirin EC 81 MG tablet Take 81 mg by mouth daily.    . budesonide-formoterol (SYMBICORT) 160-4.5 MCG/ACT inhaler INHALE 2 PUFFS INTO THE LUNGS 2 (TWO) TIMES DAILY. 10.2 g 5  . Coenzyme Q10 (COQ10)  50 MG CAPS Take by mouth.    . Evolocumab with Infusor (REPATHA PUSHTRONEX SYSTEM) 420 MG/3.5ML SOCT Inject 420 mg into the skin every 30 (thirty) days. 3 Cartridge 3  . ezetimibe (ZETIA) 10 MG tablet TAKE 1 TABLET BY MOUTH  DAILY (Patient taking differently: TAKE 1 TABLET EVERY 3 DAYS.) 90 tablet 3  . fluticasone (FLONASE) 50 MCG/ACT nasal spray Place 2 sprays into both nostrils daily. 16 g 6  . metFORMIN (GLUCOPHAGE) 500 MG tablet TAKE 1 TABLET BY MOUTH  TWICE A DAY 180 tablet 0  . rosuvastatin (CRESTOR) 10 MG tablet TAKE 1 TABLET BY MOUTH  THREE TIMES WEEKLY 39 tablet 1   No current facility-administered medications for this visit.     LABS/IMAGING: No results found for this or any previous visit (from the past 48 hour(s)). No results found.  WEIGHTS: Wt Readings from Last 3 Encounters:  06/29/17 116 lb (52.6 kg)  06/21/17 116 lb (52.6 kg)  12/14/16 115 lb (52.2 kg)    VITALS: BP (!) 158/68   Pulse 74   Ht 5\' 2"  (1.575 m)   Wt 116 lb (52.6 kg)   BMI 21.22 kg/m   EXAM: General appearance: alert and no distress Neck: no carotid bruit, no JVD and thyroid not enlarged, symmetric, no tenderness/mass/nodules Lungs: clear to auscultation bilaterally Heart: regular rate and rhythm Abdomen: soft, non-tender; bowel sounds normal; no masses,  no organomegaly Extremities: extremities normal, atraumatic, no cyanosis or edema Pulses: 2+ and symmetric Skin: Skin color, texture, turgor normal. No rashes or lesions Neurologic: Grossly normal Psych: Pleasant  EKG: Sinus rhythm with first-degree AV block, LBBB at 74-personally reviewed  ASSESSMENT: 1. 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 Statin intolerance - ok for rousuvastatin 10 mg TIW 3. Bilateral carotid bruits- mild bilateral carotid stenosis (07/2015) 4. Claudication concerning for PAD - moderate to severe PAD (07/2015) 5. Type 2 diabetes 6. Hypertension 7. LBBB 8. Systolic  murmur  PLAN: 1.   Diane Richmond has had significant reduction in her cholesterol on Repatha.  She will need to continue on this and her daughter noted that there was some difficulty with the paperwork.  She is also recently had elevated blood pressures.  We may need to increase her medication or perhaps add something to that.  She is advised to  follow her blood pressures at home and will arrange for hypertension clinic follow-up.  At that time will be important to check with her to see if she is received her Repatha and is using it.  Most recent labs from the [facility] tool, demonstrate her LDL cholesterol is been reduced to 58 as of November 2018, but triglycerides remain elevated to 41.  She may be a candidate to add Vascepa.  She is also on ezetimibe and max tolerated rosuvastatin 10 mg 3 times weekly.  With regards to FH, she is potentially interested in genetic screening given her family history which would be helpful to identify abnormalities in her offspring.  I will refer her to Dr. Jomarie Longs for testing.  Follow-up with me in 6 months in lipid clinic.  Chrystie Nose, MD, [facility], FACP  [place]  [facility]  Medical Director of the [facility] Lipid Disorders &  Cardiovascular Risk Reduction Clinic Diplomate of the American Board of Clinical Lipidology Attending Cardiologist  Direct Dial: [phone]  Fax: [phone]  Website:  [email or web address] 06/29/2017, 8:33 AM

## 2017-07-04 ENCOUNTER — Telehealth: Payer: Self-pay | Admitting: Internal Medicine

## 2017-07-04 NOTE — Telephone Encounter (Signed)
Faxed referral to Dr. Sidney AceSumy Joseph for genetic testing appointment.

## 2017-08-03 ENCOUNTER — Ambulatory Visit: Payer: Medicare Other

## 2017-08-17 ENCOUNTER — Encounter: Payer: Medicare Other | Admitting: Genetic Counselor

## 2017-08-23 ENCOUNTER — Telehealth: Payer: Self-pay | Admitting: Nurse Practitioner

## 2017-08-23 NOTE — Telephone Encounter (Signed)
Spoke with [name] regarding AWV. Patient stated that she will have her daughter give the office a call back to schedule her wellness appointment. SF

## 2017-08-30 ENCOUNTER — Ambulatory Visit (INDEPENDENT_AMBULATORY_CARE_PROVIDER_SITE_OTHER): Payer: Medicare Other | Admitting: Pharmacist

## 2017-08-30 VITALS — BP 146/74 | HR 80 | Ht 62.0 in | Wt 119.0 lb

## 2017-08-30 DIAGNOSIS — I10 Essential (primary) hypertension: Secondary | ICD-10-CM

## 2017-08-30 MED ORDER — AMLODIPINE BESYLATE 5 MG PO TABS: 7.5000 mg | ORAL_TABLET | Freq: Every day | ORAL | 0 refills | Status: DC

## 2017-08-30 NOTE — Patient Instructions (Signed)
Return for a follow up appointment in 4 weeks  Check your blood pressure at home daily (if able) and keep record of the readings.  Take your BP meds as follows: *INCREASE amlodipine dose to 7.5mg  daily (1 and 1/2 tablets daily)*  Bring all of your meds, your BP cuff and your record of home blood pressures to your next appointment.  Exercise as you're able, try to walk approximately 30 minutes per day.  Keep salt intake to a minimum, especially watch canned and prepared boxed foods.  Eat more fresh fruits and vegetables and fewer canned items.  Avoid eating in fast food restaurants.    HOW TO TAKE YOUR BLOOD PRESSURE: . Rest 5 minutes before taking your blood pressure. .  Don't smoke or drink caffeinated beverages for at least 30 minutes before. . Take your blood pressure before (not after) you eat. . Sit comfortably with your back supported and both feet on the floor (don't cross your legs). . Elevate your arm to heart level on a table or a desk. . Use the proper sized cuff. It should fit smoothly and snugly around your bare upper arm. There should be enough room to slip a fingertip under the cuff. The bottom edge of the cuff should be 1 inch above the crease of the elbow. . Ideally, take 3 measurements at one sitting and record the average.

## 2017-08-30 NOTE — Progress Notes (Signed)
Patient ID: [name]                 DOB: 18-Oct-1945                      MRN: [id number]     HPI: Diane Richmond is a 72 y.o. female referred by Dr. Rennis Golden to HTN clinic. PMH includes asthma, uncontrolled hypertension, FH, and DM-II. Patient currently on Repatha, ezetimibe and rosuvastatin for FH. Currently on monotherapy with amlodipine 5g for HTN management.  Patient presents to clinic accompany by daughter. Only complain is nasal congestion. She is currently taking allegra , and flonase for allergies. Denies dizziness, swelling, headaches or chest pain.  Current HTN meds:  Amlodipine  daily  Previously tried:  Olmesartan  daily  BP goal: 130/80 if able to tolerate   Family History:  Father died from stroke at age 73 Mother died from MI at age 75 Sister died from stroke in her 52's  Social History:  Reports that  has never smoked. She quit smokeless tobacco use about 54 years ago. She reports that she does not drink alcohol or use drugs.  Diet:  Mostly home cooked meals; baked ands slow cooked, plenty of vegetables  Exercise:  Yard work, walking, hand weights or arms and legs  Home BP readings: none available for assessment - patient forgot notebook at home  Wt Readings from Last 3 Encounters:  08/30/17 119 lb (54 kg)  06/29/17 116 lb (52.6 kg)  06/21/17 116 lb (52.6 kg)   BP Readings from Last 3 Encounters:  08/30/17 (!) 146/74  06/29/17 (!) 158/68  06/21/17 (!) 160/85   Pulse Readings from Last 3 Encounters:  08/30/17 80  06/29/17 74  06/21/17 77    Past Medical History:  Diagnosis Date  . Asthma   . Bronchitis   . Diabetes mellitus    type II  . HX: breast cancer   . Hyperlipidemia   . Hypertension     Current Outpatient Medications on File Prior to Visit  Medication Sig Dispense Refill  . ACCU-CHEK SMARTVIEW test strip USE ONE STRIP TO CHECK GLUCOSE ONCE DAILY 100 each 12  . albuterol (PROVENTIL HFA;VENTOLIN HFA) 108 (90 Base) MCG/ACT  inhaler Inhale 2 puffs every 6 (six) hours as needed into the lungs for wheezing or shortness of breath. Must make appt w/new provider for future refills 1 Inhaler 0  . aspirin EC 81 MG tablet Take 81 mg by mouth daily.    . budesonide-formoterol (SYMBICORT) 160-4.5 MCG/ACT inhaler INHALE 2 PUFFS INTO THE LUNGS 2 (TWO) TIMES DAILY. 10.2 g 5  . Coenzyme Q10 (COQ10) 50 MG CAPS Take by mouth.    . Evolocumab with Infusor (REPATHA PUSHTRONEX SYSTEM) 420 MG/3.5ML SOCT Inject 420 mg into the skin every 30 (thirty) days. 3 Cartridge 3  . ezetimibe (ZETIA) 10 MG tablet TAKE 1 TABLET BY MOUTH  DAILY (Patient taking differently: TAKE 1 TABLET EVERY 3 DAYS.) 90 tablet 3  . fluticasone (FLONASE) 50 MCG/ACT nasal spray Place 2 sprays into both nostrils daily. 16 g 6  . metFORMIN (GLUCOPHAGE) 500 MG tablet TAKE 1 TABLET BY MOUTH  TWICE A DAY 180 tablet 0  . rosuvastatin (CRESTOR) 10 MG tablet TAKE 1 TABLET BY MOUTH  THREE TIMES WEEKLY 39 tablet 1   No current facility-administered medications on file prior to visit.     Allergies  Allergen Reactions  . Promethazine-Codeine [Promethazine-Codeine] Nausea And Vomiting  Pt states cough syrup she can't take  . Atorvastatin Other (See Comments)    REACTION: legs ache    Blood pressure (!) 146/74, pulse 80, height  (1.575 m), weight 119 lb (54 kg), SpO2 95 %.  Essential hypertension Blood pressure remains above goal of 130/80. Patient denies problems with current therapy and reports compliance.  Will increase amlodipine dose to 7.5mg  daily and follow up in 4 weeks. Patient and daughter were encouraged to call clinic between appointment if needed.  Diane Richmond PharmD, BCPS, CPP [facility] [address] [place] [address] 08/31/2017 2:19 PM

## 2017-08-31 ENCOUNTER — Encounter: Payer: Self-pay | Admitting: Pharmacist

## 2017-08-31 NOTE — Assessment & Plan Note (Addendum)
Blood pressure remains above goal of 130/80. Patient denies problems with current therapy and reports compliance.  Will increase amlodipine dose to 7.5mg  daily and follow up in 4 weeks. Patient and daughter were encouraged to call clinic between appointment if needed.

## 2017-09-14 ENCOUNTER — Ambulatory Visit: Payer: Medicare Other | Admitting: Genetic Counselor

## 2017-09-20 NOTE — Progress Notes (Signed)
Pre-test GC notes  Diane Richmond was referred for genetic consult of familial hypercholesterolemia (FH). We walked through the risk factors that can lead to hypercholesterolemia. I explained to her the characteristic features of a genetic condition, namely absence of risk factors, early age of presentation, increased disease severity and family history of the condition. The clinical manifestations of FH were also reviewed.   We went through the molecular pathogenesis of FH. I informed her that FH is primarily caused by pathogenic variants in three genes, namely APOB, LDLR and PCSK9. These pathogenic variants impact LDLR synthesis, degradation and recycling in cells leading to elevated LDL-C levels. We then walked through autosomal dominant inheritance pattern and viewed pedigree of families with heterozygous FH (HeFH) and homozygous FH (HoFH). I informed her that digenic or compound heterozygous mutations in APOB, LDLR and PCSK9 genes can cause HoFH.   We reviewed the likely outcomes of FH genetic testing. Based on the diagnostic criteria for FH, yields can range from 50%-90%. A positive yield is observed in  ~63% of patients with a definite clinical diagnosis of FH. I also made clear to her that a negative test does not exclude a genetic basis for FH. Limitations in current genetic testing methodology can produce a negative result. Variants of unknown significance (VUS) can be seen in some cases.  It is also likely that mutation negative patients reflect a polygenic origin where more than an average number of common variants with small effect that raises plasma lipid levels were inherited. I explained to her that typically a VUS is so classified if the variant is not well understood as very few individuals have been reported to harbor this variant or its role in gene function has not been elucidated. Screening other first-degree family members by genetic testing was also discussed. Additionally, we briefly  touched upon the molecular basis of the different treatment modalities that are currently available.  Her medical and 4-generation family history was obtained. See details below-  Personal Medical Information Diane Richmond (III.7 on pedigree) is a pleasant 72 year old African American lady who is here today with her daughter, Diane Richmond. She states that she was found to elevated LDL-C around age 39. She has not had premature coronary heart disease (CHD), myocardial infarction or angina. She also reports the absence of xanthomas or corneal arcus.  Currently she is taking statin and PCSK9 inhibitor therapy consistently for the last 4 months. She will find out if her LDL-C levels have decreased when she sees Dr. Rennis Golden in 2 weeks.  Family history Diane Richmond (III.7) is one on 11 siblings. She has 4 daughters (IV.1-IV.4), ages 13, 8, 24 and 42. Her 72 year old (IV.22) and 72 year old (IV.4) daughters had elevated cholesterol. One (IV.3) was on statin therapy, but since losing weight, she is no longer on statins. Her youngest daughter (IV.4) is maintaining her cholesterol levels with a low dose of statins.  Diane Richmond has 4 living siblings (III.1, III.5, III.6 and III.11). Her eldest sister (III.1) had a stroke at age 16. She is not aware of her cholesterol status. She has two brothers with high cholesterol; one had a heart attack at age 61 (III.6) and the other is her youngest brother (III.8) who has not had any adverse events as far as she is aware. She informs me that her siblings do not like to discuss their medical history and are close-lipped about it.   There is no history of atherosclerotic vascular disease in her paternal lineage.  Her father died  from a heat stroke at age 69. She tells me that he was working on the farm when he came home and collapsed. She states that an autopsy was performed that confirmed his cause of death. She does not anything else about her paternal relatives.     Her mother (II.5) died of a  heart attack at 12. She does not know the medical history of her other maternal relatives.   Impression and Plans  In summary, Diane Richmond was diagnosed with hyperlipidemia late in life at age 47. She has not had premature CHD and does not present with xanthomas or corneal arcus.  In addition, her family history is not significant for hypercholesterolemia. She believes that poor diet and exercise has contributed to elevated cholesterol in her children and both brothers. Genetic testing for FH would most likely yield a negative result. It is likely that her hyperlipidemia has a polygenic etiology. I would not recommend genetic testing at this time. I have asked her to gather details about the exact LDL-C levels in her family members and we can reconsider genetic testing at a later time if she has further clarity about her siblings health status. She verbalized understanding of this.                                                                                                                                                                                                                                                             Sidney Ace, Ph.D, [facility] Clinical Molecular Geneticist

## 2017-09-27 ENCOUNTER — Ambulatory Visit: Payer: Medicare Other

## 2017-09-27 NOTE — Progress Notes (Deleted)
Patient ID: Diane Richmond                 DOB: 01-21-1946                      MRN: [id number]     HPI: Diane Richmond is a 72 y.o. female referred by Dr. Rennis Golden to HTN clinic. PMH includes asthma, uncontrolled hypertension, FH, and DM-II. Patient currently on Repatha, ezetimibe and rosuvastatin for FH. Currently on monotherapy with amlodipine 5g for HTN management.  Patient presents to clinic accompany by daughter. Only complain is nasal congestion. She is currently taking allegra , and flonase for allergies. Denies dizziness, swelling, headaches or chest pain.  Current HTN meds:  Amlodipine 7.5 mg daily  Previously tried:  Olmesartan 20mg  daily  BP goal: 130/80 if able to tolerate   Family History:  Father died from heat stroke at age 19 Mother died from MI at age 60 brother with MI at age 40 Sister with stroke at age 62 (still living)  Social History:  Reports that  has never smoked. She quit smokeless tobacco use about 54 years ago. She reports that she does not drink alcohol or use drugs.  Diet:  Mostly home cooked meals; baked ands slow cooked, plenty of vegetables  Exercise:  Yard work, walking, hand weights or arms and legs  Home BP readings: none available for assessment - patient forgot notebook at home  Wt Readings from Last 3 Encounters:  08/30/17 119 lb (54 kg)  06/29/17 116 lb (52.6 kg)  06/21/17 116 lb (52.6 kg)   BP Readings from Last 3 Encounters:  08/30/17 (!) 146/74  06/29/17 (!) 158/68  06/21/17 (!) 160/85   Pulse Readings from Last 3 Encounters:  08/30/17 80  06/29/17 74  06/21/17 77    Past Medical History:  Diagnosis Date  . Asthma   . Bronchitis   . Diabetes mellitus    type II  . HX: breast cancer   . Hyperlipidemia   . Hypertension     Current Outpatient Medications on File Prior to Visit  Medication Sig Dispense Refill  . ACCU-CHEK SMARTVIEW test strip USE ONE STRIP TO CHECK GLUCOSE ONCE DAILY 100 each 12  . albuterol  (PROVENTIL HFA;VENTOLIN HFA) 108 (90 Base) MCG/ACT inhaler Inhale 2 puffs every 6 (six) hours as needed into the lungs for wheezing or shortness of breath. Must make appt w/new provider for future refills 1 Inhaler 0  . amLODipine (NORVASC) 5 MG tablet Take 1.5 tablets (7.5 mg total) by mouth daily. 135 tablet 0  . aspirin EC 81 MG tablet Take 81 mg by mouth daily.    . budesonide-formoterol (SYMBICORT) 160-4.5 MCG/ACT inhaler INHALE 2 PUFFS INTO THE LUNGS 2 (TWO) TIMES DAILY. 10.2 g 5  . Coenzyme Q10 (COQ10) 50 MG CAPS Take by mouth.    . Evolocumab with Infusor (REPATHA PUSHTRONEX SYSTEM) 420 MG/3.5ML SOCT Inject 420 mg into the skin every 30 (thirty) days. 3 Cartridge 3  . ezetimibe (ZETIA) 10 MG tablet TAKE 1 TABLET BY MOUTH  DAILY (Patient taking differently: TAKE 1 TABLET EVERY 3 DAYS.) 90 tablet 3  . fluticasone (FLONASE) 50 MCG/ACT nasal spray Place 2 sprays into both nostrils daily. 16 g 6  . metFORMIN (GLUCOPHAGE) 500 MG tablet TAKE 1 TABLET BY MOUTH  TWICE A DAY 180 tablet 0  . rosuvastatin (CRESTOR) 10 MG tablet TAKE 1 TABLET BY MOUTH  THREE TIMES WEEKLY 39 tablet 1  No current facility-administered medications on file prior to visit.     Allergies  Allergen Reactions  . Promethazine-Codeine [Promethazine-Codeine] Nausea And Vomiting    Pt states cough syrup she can't take  . Atorvastatin Other (See Comments)    REACTION: legs ache    There were no vitals taken for this visit.  No problem-specific Assessment & Plan notes found for this encounter.  Raquel Rodriguez-Guzman PharmD, BCPS, CPP [facility] [address] [place] [phone] 09/27/2017 12:55 PM

## 2017-10-13 ENCOUNTER — Other Ambulatory Visit: Payer: Self-pay | Admitting: Nurse Practitioner

## 2017-10-13 DIAGNOSIS — I10 Essential (primary) hypertension: Secondary | ICD-10-CM

## 2017-12-15 ENCOUNTER — Other Ambulatory Visit: Payer: Self-pay | Admitting: Nurse Practitioner

## 2017-12-15 ENCOUNTER — Other Ambulatory Visit: Payer: Self-pay | Admitting: Internal Medicine

## 2018-01-26 ENCOUNTER — Other Ambulatory Visit: Payer: Self-pay | Admitting: Family

## 2018-01-31 ENCOUNTER — Other Ambulatory Visit: Payer: Self-pay | Admitting: Nurse Practitioner

## 2018-01-31 ENCOUNTER — Other Ambulatory Visit: Payer: Self-pay | Admitting: Family

## 2018-01-31 DIAGNOSIS — Z1231 Encounter for screening mammogram for malignant neoplasm of breast: Secondary | ICD-10-CM

## 2018-02-07 ENCOUNTER — Ambulatory Visit
Admission: RE | Admit: 2018-02-07 | Discharge: 2018-02-07 | Disposition: A | Discharge status: Home / Self Care | Payer: Medicare Other | Source: Ambulatory Visit | Attending: Nurse Practitioner | Admitting: Nurse Practitioner

## 2018-02-07 DIAGNOSIS — Z1231 Encounter for screening mammogram for malignant neoplasm of breast: Secondary | ICD-10-CM | POA: Diagnosis not present

## 2018-02-18 ENCOUNTER — Telehealth: Payer: Self-pay | Admitting: Pharmacist Clinician (PhC)/ Clinical Pharmacy Specialist

## 2018-02-18 DIAGNOSIS — E7801 Familial hypercholesterolemia: Secondary | ICD-10-CM

## 2018-02-18 NOTE — Telephone Encounter (Signed)
Mailed new Safety Net application and lab order 

## 2018-02-24 ENCOUNTER — Other Ambulatory Visit: Payer: Self-pay | Admitting: Nurse Practitioner

## 2018-02-24 ENCOUNTER — Other Ambulatory Visit: Payer: Self-pay | Admitting: Internal Medicine

## 2018-02-24 DIAGNOSIS — I10 Essential (primary) hypertension: Secondary | ICD-10-CM

## 2018-03-29 ENCOUNTER — Other Ambulatory Visit: Payer: Self-pay

## 2018-03-29 NOTE — Patient Outreach (Signed)
[facility] Care Management  03/29/2018  Arcelia JewBettie R Kutscher Sep 04, 1945 [id number]   Medication Adherence call to Mrs. [name] patient did not answer patient is due on Metformin 500 mg. Mrs. Lyndell Lennonis showing past due under [facility].   Lillia AbedAna Ollison-Moran CPhT Pharmacy Technician [facility] Care Management Direct Dial [phone]  Fax [phone] [email or web address]

## 2018-04-04 ENCOUNTER — Other Ambulatory Visit: Payer: Self-pay | Admitting: Nurse Practitioner

## 2018-04-04 MED ORDER — BUDESONIDE-FORMOTEROL FUMARATE 160-4.5 MCG/ACT IN AERO: INHALATION_SPRAY | RESPIRATORY_TRACT | 0 refills | Status: DC

## 2018-04-04 MED ORDER — ALBUTEROL SULFATE HFA 108 (90 BASE) MCG/ACT IN AERS: 2.0000 | INHALATION_SPRAY | Freq: Four times a day (QID) | RESPIRATORY_TRACT | 0 refills | Status: DC | PRN

## 2018-04-04 NOTE — Telephone Encounter (Signed)
Requested medication (s) are due for refill today: Yes  Requested medication (s) are on the active medication list: Yes  Last refill:  Albuterol inhaler 03/02/17; Symbicort 12/15/16  Future visit scheduled: No  Notes to clinic:  Expired Rx, patient is out of medication.     Requested Prescriptions  Pending Prescriptions Disp Refills   albuterol (PROVENTIL HFA;VENTOLIN HFA) 108 (90 Base) MCG/ACT inhaler 1 Inhaler 0    Sig: Inhale 2 puffs into the lungs every 6 (six) hours as needed for wheezing or shortness of breath. Must make appt w/new provider for future refills     Pulmonology:  Beta Agonists Failed - 04/04/2018  4:52 PM      Failed - One inhaler should last at least one month. If the patient is requesting refills earlier, contact the patient to check for uncontrolled symptoms.      Passed - Valid encounter within last 12 months    Recent Outpatient Visits          9 months ago Essential hypertension   [facility] A[facility] Camps[facility]ambley, Audie BoxAshleigh N, NP   1 year ago Medicare annual wellness visit, subsequent   [facility] Calone, Tama HeadingsGregory D, FNP   2 years ago Medicare annual wellness visit, subsequent   [facility], Tama HeadingsGregory D, FNP   3 years ago Hyperlipidemia   [facility] Calone, Gregory D, FNP   4 years ago Encounter to discuss test results   [facility], Tama HeadingsGregory D, FNP      Future Appointments            In 2 months Hilty, Lisette AbuKenneth C, MD [facility]          budesonide-formoterol (SYMBICORT) 160-4.5 MCG/ACT inhaler 10.2 g 5    Sig: INHALE 2 PUFFS INTO THE LUNGS 2 (TWO) TIMES DAILY.     Pulmonology:  Combination Products Passed - 04/04/2018  4:52 PM      Passed - Valid encounter within last 12 months    Recent Outpatient Visits          9 months ago Essential hypertension   [facility]  Aura CampsShambley, Audie BoxAshleigh N, NP   1 year ago Medicare annual wellness visit, subsequent   [facility] Cal[facility], Tama HeadingsGregory D, FNP   2 years ago Medicare annual wellness visit, subsequent   [facility], Tama HeadingsGregory D, FNP   3 years ago Hyperlipidemia   [facility] Calone, Gregory D, FNP   4 years ago Encounter to discuss test results   [facility] Calone, Gregory D, FNP      Future Appointments            In 2 months Hilty, Lisette AbuKenneth C, MD [facility]

## 2018-04-04 NOTE — Telephone Encounter (Signed)
Copied from CRM [id number]. Topic: Quick Communication - Rx Refill/Question >> Apr 04, 2018 11:00 AM Lynne LoganHudson, Caryn D wrote: Medication: budesonide-formoterol (SYMBICORT) 160-4.5 MCG/ACT inhaler / albuterol (PROVENTIL HFA;VENTOLIN HFA) 108 (90 Base) MCG/ACT inhaler / Pharmacy instructed to contact office due to rx expiring / Pt is out of medication   Has the patient contacted their pharmacy? Yes.   (Agent: If no, request that the patient contact the pharmacy for the refill.) (Agent: If yes, when and what did the pharmacy advise?)  Preferred Pharmacy (with phone number or street name): [id number] G[place]ette Otto- [place], [place] - [address] [phone] (Phone) [phone] (Fax)    Agent: Please be advised that RX refills may take up to 3 business days. We ask that you follow-up with your pharmacy.

## 2018-04-26 ENCOUNTER — Other Ambulatory Visit: Payer: Self-pay | Admitting: Nurse Practitioner

## 2018-05-01 DIAGNOSIS — S46912A Strain of unspecified muscle, fascia and tendon at shoulder and upper arm level, left arm, initial encounter: Secondary | ICD-10-CM | POA: Diagnosis not present

## 2018-05-01 DIAGNOSIS — E119 Type 2 diabetes mellitus without complications: Secondary | ICD-10-CM | POA: Diagnosis not present

## 2018-05-01 DIAGNOSIS — M25512 Pain in left shoulder: Secondary | ICD-10-CM | POA: Diagnosis not present

## 2018-05-02 ENCOUNTER — Ambulatory Visit: Payer: Medicare Other | Admitting: Family

## 2018-05-14 ENCOUNTER — Other Ambulatory Visit: Payer: Self-pay | Admitting: Nurse Practitioner

## 2018-05-14 MED ORDER — BUDESONIDE-FORMOTEROL FUMARATE 160-4.5 MCG/ACT IN AERO: INHALATION_SPRAY | RESPIRATORY_TRACT | 0 refills | Status: DC

## 2018-05-14 NOTE — Addendum Note (Signed)
Addended by: Merrilyn Puma on: 05/14/2018 09:49 AM   Modules accepted: Orders

## 2018-05-25 ENCOUNTER — Other Ambulatory Visit: Payer: Self-pay | Admitting: Internal Medicine

## 2018-05-25 MED ORDER — EZETIMIBE 10 MG PO TABS: 10.0000 mg | ORAL_TABLET | Freq: Every day | ORAL | 0 refills | Status: DC

## 2018-05-25 NOTE — Telephone Encounter (Signed)
Rx(s) sent to pharmacy electronically.  

## 2018-05-25 NOTE — Telephone Encounter (Signed)
° ° ° ° °*  STAT* If patient is at the pharmacy, call can be transferred to refill team.   1. Which medications need to be refilled? (please list name of each medication and dose if known) ezetimibe (ZETIA) 10 MG tablet  2. Which pharmacy/location (including street and city if local pharmacy) is medication to be sent to? [facility], [address]  3. Do they need a 30 day or 90 day supply? 30

## 2018-05-26 ENCOUNTER — Other Ambulatory Visit: Payer: Self-pay | Admitting: Internal Medicine

## 2018-05-28 NOTE — Telephone Encounter (Signed)
Rx(s) sent to pharmacy electronically.  

## 2018-05-30 ENCOUNTER — Other Ambulatory Visit: Payer: Self-pay | Admitting: Nurse Practitioner

## 2018-05-30 DIAGNOSIS — I10 Essential (primary) hypertension: Secondary | ICD-10-CM

## 2018-06-14 ENCOUNTER — Telehealth: Payer: Self-pay

## 2018-06-14 DIAGNOSIS — E7801 Familial hypercholesterolemia: Secondary | ICD-10-CM

## 2018-06-14 NOTE — Telephone Encounter (Signed)
Pt called regarding turning in their pt assistance forms but I haven't gotten it nor have I done an epa for repatha for them but told them that I would start one now just to be safe I will start one once that I know that the pharmd doesn't already have an approval on file

## 2018-06-19 NOTE — Telephone Encounter (Signed)
Called and left a msg on the emergency contacts phone regarding the pt's need for a reauthorization needing lab work. I leftt a detailed msg instructing them to come in for labs & that an appt is not needed

## 2018-06-20 ENCOUNTER — Other Ambulatory Visit: Payer: Self-pay | Admitting: *Deleted

## 2018-06-20 DIAGNOSIS — E7801 Familial hypercholesterolemia: Secondary | ICD-10-CM

## 2018-06-20 LAB — HEPATIC FUNCTION PANEL
ALK PHOS: 63 IU/L (ref 39–117)
ALT: 12 IU/L (ref 0–32)
AST: 18 IU/L (ref 0–40)
Albumin: 4.6 g/dL (ref 3.7–4.7)
Bilirubin Total: 0.4 mg/dL (ref 0.0–1.2)
Bilirubin, Direct: 0.13 mg/dL (ref 0.00–0.40)
TOTAL PROTEIN: 7.8 g/dL (ref 6.0–8.5)

## 2018-06-20 LAB — LIPID PANEL
CHOL/HDL RATIO: 2.4 ratio (ref 0.0–4.4)
Cholesterol, Total: 92 mg/dL — ABNORMAL LOW (ref 100–199)
HDL: 39 mg/dL — AB (ref >39)
LDL CALC: 41 mg/dL (ref 0–99)
TRIGLYCERIDES: 60 mg/dL (ref 0–149)
VLDL Cholesterol Cal: 12 mg/dL (ref 5–40)

## 2018-06-26 ENCOUNTER — Encounter: Payer: Self-pay | Admitting: Internal Medicine

## 2018-06-26 ENCOUNTER — Ambulatory Visit: Payer: Medicare Other | Admitting: Internal Medicine

## 2018-06-26 VITALS — BP 138/72 | HR 75 | Ht 62.0 in | Wt 109.0 lb

## 2018-06-26 DIAGNOSIS — E7801 Familial hypercholesterolemia: Secondary | ICD-10-CM

## 2018-06-26 DIAGNOSIS — I739 Peripheral vascular disease, unspecified: Secondary | ICD-10-CM

## 2018-06-26 DIAGNOSIS — I779 Disorder of arteries and arterioles, unspecified: Secondary | ICD-10-CM

## 2018-06-26 DIAGNOSIS — I447 Left bundle-branch block, unspecified: Secondary | ICD-10-CM

## 2018-06-26 NOTE — Progress Notes (Signed)
OFFICE NOTE  Chief Complaint:  No complaints  Primary Care Physician: Evaristo BuryShambley, Ashleigh N, NP  HPI:  Diane Richmond is a 73 y.o. female who is currently referred to me by Charlann NossGreg Cologne, FNP, for evaluation of elevated cholesterol. I reviewed several years of cholesterol data as of 2013. Currently her total cholesterol is 302 which compares to a total cholesterol 266 3 years ago. Her triglycerides are 167, HDL 31, LDL 237, and non-HDL cholesterol of 270. She had a direct LDL over 200 several years ago. In addition she has type 2 diabetes, hypertension, asthma, and an abnormal EKG demonstrating left bundle branch block. She denies any chest pain or worsening shortness of breath but has had some worsening fatigue and decreased exercise tolerance, particularly over the last 6 months. Her family history is significant for mother who died of massive heart attack at age 73 and father who had a stroke and died at age 73. She also had a sister who died of stroke. In the past she had been on Lipitor but had significant myalgias with this and was switched to Crestor 20 mg daily. She said this also bothered her and lower dose was recommended. Blood pressure is up today at 162/86 but she reports being nervous. She's never been a smoker and denies alcohol use. Her sleepiness score was 0. She mentions that she does get pain in her legs when walking certain distances that gets better when she rests.  01/29/2016  Diane Richmond returns today for follow-up. Unfortunately she did not make routine follow-up and we were unable to contact her for several months. We did relay the results of her extensive testing by mail. She was scheduled for follow-up appointment which she did not make and a referral to my partner which she canceled. She returns today continues to have symptoms concerning for claudication. She underwent a nuclear stress test which was negative for ischemia and demonstrated normal LV function with an EF of 64%.  Echocardiogram also showed an EF of 60-65%, grade 1 diastolic dysfunction, mild TR and PI with an RVSP of 31 mmHg. She had carotid Dopplers which showed mild bilateral carotid artery stenosis. Of note, she did have lower extremity arterial Dopplers which showed moderate to severe bilateral lower extremity arterial insufficiency. ABIs were 0.24 and the right and 0.4 on the left. Based on these findings I referred her to Dr. Allyson SabalBerry for evaluation and possible angiogram however she did not make this appointment. I also recommended additional lab work including an extended lipid profile as she has been intolerant to statins. We had to decrease her Crestor to 10 mg daily which she seems to be tolerating along with ezetimibe. She was being considered for Repatha and this will need to be reinitiated. 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  06/24/2016  Diane Richmond returns today for follow-up. She reports she does get some pain in her legs when she walks but is not lifestyle limiting at this point. She recently saw Dr. Allyson SabalBerry who talked about lower extremity angiogram however she declined and wanted to see how she did with medical therapy. Unfortunate she's not even taking daily aspirin. I highly encouraged that today. In addition we've been working on lowering cholesterol. She's currently only able to tolerate Crestor 10 mg alternating with 20 mg and then 10 mg 3 times a week. I would like to recheck her cholesterol to see where she is at currently. The plan is if she is not at goal LDL less  than 70 and non-HDL cholesterol less than 022, then we would need to consider adding medication such neuropathic. There is clearly a survival benefit in patients as well as a decreased risk of critical limb ischemia that was demonstrated in the FOURIER trial.  12/14/2016  Diane Richmond returns today for follow-up. We repeated her lipid profile and pleased to say she's had marked improvement. Total cholesterol has decreased from 300  to 175 over the past year. Triglycerides are down from 167-63. HDL is increased from 31-37 and LDL-C has decreased from 237-125. She's currently on ezetimibe 10 mg daily and rosuvastatin 10 mg daily. She reports she cannot take increased doses of rosuvastatin secondary to myalgias and was completely intolerant of even low doses of atorvastatin. Although she's had marked and significant improvement in her LDL cholesterol, her goal LDL-C is less than 70 given the fact she has significant peripheral arterial disease with decreased ABIs. I discussed this with her at length today and feel that she is a good candidate for PCSK9 inhibitor. We discussed options and she felt that once monthly dosing with her path via the pump is the best option for her.  06/29/2017  Diane Richmond returns today for follow-up.  Overall she seems to be doing well.  She is noted recently that her blood pressure was elevated.  Her daughter brought her blood pressure cuff.  Fortunately she was recently started on Repatha.  She says she took her last dose in January, but there was a problem with renewing the medication and she did not have a dose in February.  Prior to that she had a marked reduction as previously described in her LDL cholesterol to 125.  This is still much higher than her target less than 70 however marks a significant reduction from her LDL cholesterol which was greater than 300.  Her most recent labs in November 2018 showed total cholesterol 143, LDL 58, triglycerides 241 and HDL 37.  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  In addition, we discussed this further today.  She does have 4 children, 3 of which have had difficulty with elevated cholesterol.  Her daughter, who accompanied her today said it is not an issue for her.  We discussed the possibility of genetic screening as she has a number of grandchildren and even a great grandchild who certainly could be at risk for genetic dyslipidemia.   She denies any new symptoms of claudication, chest pain or worsening shortness of breath.  Blood pressure at home has been in the 140s and 150s and was noted to be 158 systolic today.  06/26/2018  Diane Richmond has done well on the Repatha Pushtronex system.  Her most recent lipid profile this month showed total cholesterol 92, triglycerides 60, HDL 39 and LDL 41.  We have achieved approval for her.  She denies any significant claudication.  She says she is active and walks fairly regularly.  She did have high-grade bilateral SFA disease as well as carotid artery disease in the past and is due for repeat Dopplers.  She denies any chest pain.  She does report some congestion and seasonal allergies but other than that no significant shortness of breath.  EKG is stable today shows sinus rhythm and left bundle branch block.  Blood pressure is at goal.  PMHx:  Past Medical History:  Diagnosis Date  . Asthma   . Bronchitis   . Diabetes mellitus    type II  . HX: breast cancer   .  Hyperlipidemia   . Hypertension     Past Surgical History:  Procedure Laterality Date  . BREAST LUMPECTOMY  1994   left  . right port-a-cath  1994  . TONSILLECTOMY      FAMHx:  Family History  Problem Relation Age of Onset  . Heart disease Mother        CAD/MI  . Aneurysm Father        CVA  . Stroke Sister   . Hypertension Sister   . Breast cancer Unknown   . Diabetes Unknown   . Hypertension Unknown   . Cancer Brother   . Cancer Brother     SOCHx:   reports that she has never smoked. She quit smokeless tobacco use about 55 years ago. She reports that she does not drink alcohol or use drugs.  ALLERGIES:  Allergies  Allergen Reactions  . Promethazine-Codeine [Promethazine-Codeine] Nausea And Vomiting    Pt states cough syrup she can't take  . Atorvastatin Other (See Comments)    REACTION: legs ache    ROS: Pertinent items noted in HPI and remainder of comprehensive ROS otherwise negative.  HOME  MEDS: Current Outpatient Medications  Medication Sig Dispense Refill  . ACCU-CHEK SMARTVIEW test strip USE ONE STRIP TO CHECK GLUCOSE ONCE DAILY 100 each 12  . albuterol (PROVENTIL HFA;VENTOLIN HFA) 108 (90 Base) MCG/ACT inhaler Inhale 2 puffs into the lungs every 6 (six) hours as needed for wheezing or shortness of breath. Needs appointment. 1 Inhaler 0  . amLODipine (NORVASC) 5 MG tablet TAKE 1 TABLET BY MOUTH  EVERY DAY 90 tablet 0  . aspirin EC 81 MG tablet Take 81 mg by mouth daily.    . budesonide-formoterol (SYMBICORT) 160-4.5 MCG/ACT inhaler TAKE 2 PUFFS BY MOUTH TWICE A DAY **NO FURTHER REFILLS WILL BE GIVEN UNTIL SEEN 30.6 Inhaler 0  . Coenzyme Q10 (COQ10) 50 MG CAPS Take by mouth.    . Evolocumab with Infusor (REPATHA PUSHTRONEX SYSTEM) 420 MG/3.5ML SOCT Inject 420 mg into the skin every 30 (thirty) days. 3 Cartridge 3  . ezetimibe (ZETIA) 10 MG tablet Take 1 tablet (10 mg total) by mouth daily. 90 tablet 0  . fluticasone (FLONASE) 50 MCG/ACT nasal spray Place 2 sprays into both nostrils daily. 16 g 6  . metFORMIN (GLUCOPHAGE) 500 MG tablet TAKE 1 TABLET BY MOUTH  TWICE A DAY 180 tablet 0  . rosuvastatin (CRESTOR) 10 MG tablet TAKE 1 TABLET BY MOUTH 3  TIMES WEEKLY 36 tablet 0   No current facility-administered medications for this visit.     LABS/IMAGING: No results found for this or any previous visit (from the past 48 hour(s)). No results found.  WEIGHTS: Wt Readings from Last 3 Encounters:  06/26/18 109 lb (49.4 kg)  08/30/17 119 lb (54 kg)  06/29/17 116 lb (52.6 kg)    VITALS: BP 138/72   Pulse 75   Ht  (1.575 m)   Wt 109 lb (49.4 kg)   BMI 19.94 kg/m   EXAM: General appearance: alert and no distress Neck: no carotid bruit, no JVD and thyroid not enlarged, symmetric, no tenderness/mass/nodules Lungs: clear to auscultation bilaterally Heart: regular rate and rhythm Abdomen: soft, non-tender; bowel sounds normal; no masses,  no organomegaly Extremities:  extremities normal, atraumatic, no cyanosis or edema Pulses: 2+ and symmetric Skin: Skin color, texture, turgor normal. No rashes or lesions Neurologic: Grossly normal Psych: Pleasant  EKG: Normal sinus rhythm 75, LBBB-personally reviewed  ASSESSMENT: 1. 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 Statin intolerance - ok for rousuvastatin 10 mg TIW 3. Bilateral carotid bruits- mild bilateral carotid stenosis (07/2015) 4. Claudication concerning for PAD - moderate to severe PAD (07/2015) 5. Type 2 diabetes 6. Hypertension 7. LBBB 8. Systolic murmur  PLAN: 1.   Diane Richmond has had significant positive response to Repatha now is a goal LDL.  She is not experiencing any significant claudication.  I suspect that she has had possible improvement in her peripheral arterial disease.  She denies any symptoms of claudication.  I would like to repeat carotid and lower extremity arterial Dopplers which were last performed in 2017.  She also has type 2 diabetes with hemoglobin A1c of 6 with indicating well controlled.  Her blood pressure is at goal.  Left bundle branch block is stable.  We will continue with Repatha and plan follow-up in 6 months or sooner as necessary.  Diane Nose, MD, [facility], FACP  [place]  [facility]  Medical Director of the Advanced Lipid Disorders &  Cardiovascular Risk Reduction Clinic Diplomate of the American Board of Clinical Lipidology Attending Cardiologist  Direct Dial: [phone]  Fax: [phone]  Website:  [email or web address] Nicely Jamison Soward 06/26/2018, 9:52 AM

## 2018-06-26 NOTE — Patient Instructions (Signed)
Medication Instructions:  Your Physician recommend you continue on your current medication as directed.    If you need a refill on your cardiac medications before your next appointment, please call your pharmacy.   Lab work: None  Testing/Procedures: Your physician has requested that you have a carotid duplex. This test is an ultrasound of the carotid arteries in your neck. It looks at blood flow through these arteries that supply the brain with blood. Allow one hour for this exam. There are no restrictions or special instructions. [address]  Your physician has requested that you have a lower extremity arterial exercise duplex. During this test, exercise and ultrasound are used to evaluate arterial blood flow in the legs. Allow one hour for this exam. There are no restrictions or special instructions. [address]  Follow-Up: At [facility], you and your health needs are our priority.  As part of our continuing mission to provide you with exceptional heart care, we have created designated Provider Care Teams.  These Care Teams include your primary Cardiologist (physician) and Advanced Practice Providers (APPs -  Physician Assistants and Nurse Practitioners) who all work together to provide you with the care you need, when you need it. You will need a follow up appointment in 6 months.  Please call our office 2 months in advance to schedule this appointment.  You may see Dr. Rennis Golden or one of the following Advanced Practice Providers on your designated Care Team: Azalee Course, [place] . Micah Flesher, PA-C

## 2018-07-02 ENCOUNTER — Other Ambulatory Visit (HOSPITAL_COMMUNITY): Payer: Self-pay | Admitting: Internal Medicine

## 2018-07-02 DIAGNOSIS — I739 Peripheral vascular disease, unspecified: Secondary | ICD-10-CM

## 2018-07-03 ENCOUNTER — Telehealth: Payer: Self-pay

## 2018-07-03 NOTE — Telephone Encounter (Signed)
Called to let the pt know that safety net denied pt assistance but instructed the pt to call the [employer]

## 2018-07-27 ENCOUNTER — Inpatient Hospital Stay (HOSPITAL_COMMUNITY): Admission: RE | Admit: 2018-07-27 | Payer: Medicare Other | Source: Ambulatory Visit

## 2018-07-27 ENCOUNTER — Encounter (HOSPITAL_COMMUNITY): Payer: Medicare Other

## 2018-08-30 ENCOUNTER — Encounter (HOSPITAL_COMMUNITY): Payer: Medicare Other

## 2018-09-06 ENCOUNTER — Other Ambulatory Visit: Payer: Self-pay | Admitting: Internal Medicine

## 2018-09-07 ENCOUNTER — Telehealth: Payer: Self-pay | Admitting: Nurse Practitioner

## 2018-09-07 NOTE — Telephone Encounter (Signed)
Copied from CRM [id number]. Topic: Quick Communication - Rx Refill/Question >> Sep 07, 2018  4:38 PM Tamela Oddi wrote: Medication: ACCU-CHEK SMARTVIEW test strip  Patient called to request a refill for the above  Preferred Pharmacy (with phone number or street name): [facility] - [place], [place] - [address] [phone] (Phone) [phone] (Fax)

## 2018-09-10 MED ORDER — GLUCOSE BLOOD VI STRP: ORAL_STRIP | 1 refills | Status: DC

## 2018-09-10 NOTE — Telephone Encounter (Signed)
Done. See meds. Needs to establish with new provider. Alphonse Guild, NP has left practice.

## 2018-11-01 ENCOUNTER — Ambulatory Visit (HOSPITAL_COMMUNITY)
Admission: RE | Admit: 2018-11-01 | Payer: Medicare Other | Source: Ambulatory Visit | Attending: Internal Medicine | Admitting: Internal Medicine

## 2018-11-13 ENCOUNTER — Other Ambulatory Visit: Payer: Self-pay

## 2018-11-13 NOTE — Patient Outreach (Signed)
[facility]) Care Management  11/13/2018  Diane Richmond 1945/05/11 [id number]  Medication Adherence call to [name] Hippa Identifiers Verify spoke with patient she is past due on Metformin 500 mg patient explain she takes 1 tablets 2 times a day and has medication patient will ask daughter to order this medication she is running low.Mrs, [name] is showing past due under [place].   [facility]helly Management Direct Dial [phone]  Fax [phone] [email or web address]

## 2018-11-17 ENCOUNTER — Other Ambulatory Visit: Payer: Self-pay | Admitting: Internal Medicine

## 2018-11-18 ENCOUNTER — Other Ambulatory Visit: Payer: Self-pay | Admitting: Internal Medicine

## 2018-11-19 ENCOUNTER — Telehealth: Payer: Self-pay | Admitting: General Practice

## 2018-11-19 ENCOUNTER — Other Ambulatory Visit: Payer: Self-pay | Admitting: *Deleted

## 2018-11-19 DIAGNOSIS — I10 Essential (primary) hypertension: Secondary | ICD-10-CM

## 2018-11-19 MED ORDER — ROSUVASTATIN CALCIUM 10 MG PO TABS: ORAL_TABLET | ORAL | 0 refills | Status: DC

## 2018-11-19 MED ORDER — EZETIMIBE 10 MG PO TABS: 10.0000 mg | ORAL_TABLET | Freq: Every day | ORAL | 0 refills | Status: DC

## 2018-11-19 MED ORDER — METFORMIN HCL 500 MG PO TABS: 500.0000 mg | ORAL_TABLET | Freq: Two times a day (BID) | ORAL | 0 refills | Status: DC

## 2018-11-19 MED ORDER — AMLODIPINE BESYLATE 5 MG PO TABS: 5.0000 mg | ORAL_TABLET | Freq: Every day | ORAL | 0 refills | Status: DC

## 2018-11-19 NOTE — Telephone Encounter (Signed)
What has she been doing for medication since May? Her last 90 day refill was done in February. I can't legally do a 90 day supply since it has been over a year since she was here. Her last OV was 05/2017.  I can do 30 day supply of each medication to local pharmacy.

## 2018-11-19 NOTE — Telephone Encounter (Signed)
Daughter, Mearl Latin can be reached at [phone]

## 2018-11-19 NOTE — Telephone Encounter (Signed)
Refill for :  metFORMIN (GLUCOPHAGE) 500 MG tablet and and amLODipine (NORVASC) 5 MG tablet    Pharmacy: [facility]   Pt's daughter would like to know if someone could refill medication. Advised that pt need to set up TOC with a different [facility] provider/location. Daughter says that she will look into it

## 2018-11-19 NOTE — Telephone Encounter (Signed)
Pt has scheduled for 8/18.   Patient is out of metformin and amlodipine.  Requesting scripts to be sent to mail order to get pt through until this appt.

## 2018-11-19 NOTE — Telephone Encounter (Signed)
Spoke with patient's daughter and info given.  

## 2018-11-19 NOTE — Telephone Encounter (Signed)
Due to office policy pt need to establish w/another provider ASAP. It's been over a year since she last seen a provider or labs. Pt must see provider for 90 day scripts...Johny Chess

## 2018-11-21 ENCOUNTER — Other Ambulatory Visit: Payer: Self-pay | Admitting: Internal Medicine

## 2018-11-21 ENCOUNTER — Other Ambulatory Visit: Payer: Self-pay

## 2018-11-21 ENCOUNTER — Ambulatory Visit (HOSPITAL_BASED_OUTPATIENT_CLINIC_OR_DEPARTMENT_OTHER)
Admission: RE | Admit: 2018-11-21 | Discharge: 2018-11-21 | Disposition: A | Discharge status: Home / Self Care | Payer: Medicare Other | Source: Ambulatory Visit | Attending: Internal Medicine | Admitting: Internal Medicine

## 2018-11-21 ENCOUNTER — Ambulatory Visit (HOSPITAL_COMMUNITY)
Admission: RE | Admit: 2018-11-21 | Discharge: 2018-11-21 | Disposition: A | Discharge status: Home / Self Care | Payer: Medicare Other | Source: Ambulatory Visit | Attending: Internal Medicine | Admitting: Internal Medicine

## 2018-11-21 DIAGNOSIS — I6523 Occlusion and stenosis of bilateral carotid arteries: Secondary | ICD-10-CM

## 2018-11-21 DIAGNOSIS — I739 Peripheral vascular disease, unspecified: Secondary | ICD-10-CM | POA: Insufficient documentation

## 2018-12-11 ENCOUNTER — Encounter: Payer: Medicare Other | Admitting: Family

## 2018-12-18 ENCOUNTER — Other Ambulatory Visit: Payer: Self-pay | Admitting: Family

## 2018-12-18 DIAGNOSIS — Z1231 Encounter for screening mammogram for malignant neoplasm of breast: Secondary | ICD-10-CM

## 2018-12-19 ENCOUNTER — Other Ambulatory Visit: Payer: Self-pay

## 2018-12-19 ENCOUNTER — Encounter: Payer: Self-pay | Admitting: Family

## 2018-12-19 ENCOUNTER — Ambulatory Visit (INDEPENDENT_AMBULATORY_CARE_PROVIDER_SITE_OTHER): Payer: Medicare Other | Admitting: Family

## 2018-12-19 VITALS — BP 138/70 | HR 75 | Temp 97.8°F | Ht 62.0 in | Wt 111.8 lb

## 2018-12-19 DIAGNOSIS — L84 Corns and callosities: Secondary | ICD-10-CM | POA: Diagnosis not present

## 2018-12-19 DIAGNOSIS — I10 Essential (primary) hypertension: Secondary | ICD-10-CM | POA: Diagnosis not present

## 2018-12-19 DIAGNOSIS — I447 Left bundle-branch block, unspecified: Secondary | ICD-10-CM

## 2018-12-19 DIAGNOSIS — E7801 Familial hypercholesterolemia: Secondary | ICD-10-CM

## 2018-12-19 DIAGNOSIS — E2839 Other primary ovarian failure: Secondary | ICD-10-CM

## 2018-12-19 DIAGNOSIS — E559 Vitamin D deficiency, unspecified: Secondary | ICD-10-CM

## 2018-12-19 DIAGNOSIS — J4520 Mild intermittent asthma, uncomplicated: Secondary | ICD-10-CM

## 2018-12-19 DIAGNOSIS — E119 Type 2 diabetes mellitus without complications: Secondary | ICD-10-CM | POA: Diagnosis not present

## 2018-12-19 MED ORDER — FLUTICASONE PROPIONATE 50 MCG/ACT NA SUSP: 2.0000 | Freq: Every day | NASAL | 6 refills | Status: DC

## 2018-12-19 MED ORDER — METFORMIN HCL 500 MG PO TABS: 500.0000 mg | ORAL_TABLET | Freq: Two times a day (BID) | ORAL | 1 refills | Status: DC

## 2018-12-19 MED ORDER — AMLODIPINE BESYLATE 5 MG PO TABS: 5.0000 mg | ORAL_TABLET | Freq: Every day | ORAL | 3 refills | Status: DC

## 2018-12-19 MED ORDER — BUDESONIDE-FORMOTEROL FUMARATE 80-4.5 MCG/ACT IN AERO: 2.0000 | INHALATION_SPRAY | Freq: Every day | RESPIRATORY_TRACT | 6 refills | Status: DC

## 2018-12-19 MED ORDER — ALBUTEROL SULFATE HFA 108 (90 BASE) MCG/ACT IN AERS: 2.0000 | INHALATION_SPRAY | Freq: Four times a day (QID) | RESPIRATORY_TRACT | 3 refills | Status: DC | PRN

## 2018-12-19 NOTE — Progress Notes (Signed)
Diane Richmond is a 73 y.o. female with the following history as recorded in EpicCare:  Patient Active Problem List   Diagnosis Date Noted  . PAD (peripheral artery disease) ([place]) 01/29/2016  . Bilateral carotid bruits 07/31/2015  . Claudication ([place]) 07/31/2015  . Medicare annual wellness visit, subsequent 06/11/2015  . ASVD (arteriosclerotic vascular disease) 03/04/2012  . Allergic rhinitis, cause unspecified 09/01/2011  . Routine general medical examination at a health care facility 09/01/2011  . Other screening mammogram 09/01/2011  . LBBB (left bundle branch block) 05/25/2011  . Controlled type 2 diabetes mellitus without complication, without long-term current use of insulin ([place]) 03/12/2010  . Familial hypercholesteremia 03/12/2010  . Essential hypertension 03/12/2010  . Asthma 03/12/2010    Current Outpatient Medications  Medication Sig Dispense Refill  . albuterol (VENTOLIN HFA) 108 (90 Base) MCG/ACT inhaler Inhale 2 puffs into the lungs every 6 (six) hours as needed for wheezing or shortness of breath. Needs appointment. 6.7 g 3  . amLODipine (NORVASC) 5 MG tablet Take 1 tablet (5 mg total) by mouth daily. 90 tablet 3  . aspirin EC 81 MG tablet Take 81 mg by mouth daily.    . Coenzyme Q10 (COQ10) 50 MG CAPS Take by mouth.    . Evolocumab with Infusor ([place]) 420 MG/3.5ML SOCT Inject 420 mg into the skin every 30 (thirty) days. 3 Cartridge 3  . ezetimibe (ZETIA) 10 MG tablet TAKE 1 TABLET BY MOUTH  DAILY 90 tablet 0  . ezetimibe (ZETIA) 10 MG tablet Take 1 tablet (10 mg total) by mouth daily. 90 tablet 0  . fluticasone (FLONASE) 50 MCG/ACT nasal spray Place 2 sprays into both nostrils daily. 16 g 6  . glucose blood (ACCU-CHEK SMARTVIEW) test strip USE ONE STRIP TO CHECK GLUCOSE ONCE DAILY. NEED TO ESTABLISH WITH NEW PROVIDER. Dx: E11.9 100 each 1  . metFORMIN (GLUCOPHAGE) 500 MG tablet Take 1 tablet (500 mg total) by mouth 2 (two) times daily. 180 tablet 1   . rosuvastatin (CRESTOR) 10 MG tablet Take 1 tablet by mouth 3 times weekly. 36 tablet 0  . budesonide-formoterol (SYMBICORT) 80-4.5 MCG/ACT inhaler Inhale 2 puffs into the lungs daily. 1 Inhaler 6   No current facility-administered medications for this visit.     Allergies: Promethazine-codeine [promethazine-codeine] and Atorvastatin  Past Medical History:  Diagnosis Date  . Asthma   . Bronchitis   . Diabetes mellitus    type II  . HX: breast cancer   . Hyperlipidemia   . Hypertension     Past Surgical History:  Procedure Laterality Date  . BREAST LUMPECTOMY  1994   left  . right port-a-cath  1994  . TONSILLECTOMY      Family History  Problem Relation Age of Onset  . Heart disease Mother        CAD/MI  . Aneurysm Father        CVA  . Stroke Sister   . Hypertension Sister   . Breast cancer Unknown   . Diabetes Unknown   . Hypertension Unknown   . Cancer Brother   . Cancer Brother     Social History   Tobacco Use  . Smoking status: Never Smoker  . Smokeless tobacco: Former Network engineer Use Topics  . Alcohol use: No    Subjective:  Patient presents for a follow-up on her Type 2 Diabetes- last OV here was February 2019; maintained on Metformin 500 mg twice a day; Does check her blood sugar  regularly- no concerns for low blood sugar- fasting blood sugars average 90-130; Most of her needs are managed by cardiology including hypertension, hyperlipidemia;  History of asthma- stable on daily Symbicort; does not have to use her albuterol inhaler;       Objective:  Vitals:   12/19/18 1104  BP: 138/70  Pulse: 75  Temp: 97.8 F (36.6 C)  TempSrc: Oral  SpO2: 99%  Weight: 111 lb 12.8 oz (50.7 kg)  Height: '5\' 2"'$  (1.575 m)    General: Well developed, well nourished, in no acute distress  Skin : Warm and dry.  Head: Normocephalic and atraumatic  Lungs: Respirations unlabored; clear to auscultation bilaterally without wheeze, rales, rhonchi  CVS exam: normal  rate and regular rhythm.  Musculoskeletal: No deformities; no active joint inflammation  Extremities: No edema, cyanosis, clubbing; bilateral foot exam is normal- corn noted on right 4th toe  Vessels: Symmetric bilaterally  Neurologic: Alert and oriented; speech intact; face symmetrical; moves all extremities well; CNII-XII intact without focal deficit  Assessment:  1. Controlled type 2 diabetes mellitus without complication, without long-term current use of insulin ([place])   2. Essential hypertension   3. Corn of toe   4. Familial hypercholesteremia   5. Vitamin D deficiency   6. Ovarian failure   7. Mild intermittent asthma without complication   8. LBBB (left bundle branch block)     Plan:  1. Update labs and refills today; encouraged to schedule for her yearly eye exam; follow-up in 6 months; 2. Stable; refill updated; 3. Refer to podiatry; 4. Continue with cardiology as scheduled;  5. Check Vitamin D level; 6. Return for DEXA at later date; 7. Stable; refill on Symbicort 80.4.5; patient has only been taking once per day; encouraged to throw her old albuterol away and get updated prescription; 8. Keep planned follow-up with cardiology.    No follow-ups on file.  Orders Placed This Encounter  Procedures  . DG Bone Density    Standing Status:   Future    Standing Expiration Date:   02/18/2020    Order Specific Question:   Reason for Exam (SYMPTOM  OR DIAGNOSIS REQUIRED)    Answer:   ovarian failure    Order Specific Question:   Preferred imaging location?    Answer:   [facility]  . Comp Met (CMET)    Standing Status:   Future    Standing Expiration Date:   12/19/2019  . Lipid panel    Standing Status:   Future    Standing Expiration Date:   12/19/2019  . HgB A1c    Standing Status:   Future    Standing Expiration Date:   12/19/2019  . CBC w/Diff    Standing Status:   Future    Standing Expiration Date:   12/19/2019  . Vitamin D (25 hydroxy)    Standing Status:    Future    Standing Expiration Date:   12/19/2019  . Urine Microalbumin w/creat. ratio    Standing Status:   Future    Standing Expiration Date:   12/19/2019  . Ambulatory referral to Podiatry    Referral Priority:   Routine    Referral Type:   Consultation    Referral Reason:   Specialty Services Required    Requested Specialty:   Podiatry    Number of Visits Requested:   1    Requested Prescriptions   Signed Prescriptions Disp Refills  . budesonide-formoterol (SYMBICORT) 80-4.5 MCG/ACT inhaler 1 Inhaler 6  Sig: Inhale 2 puffs into the lungs daily.  Marland Kitchen albuterol (VENTOLIN HFA) 108 (90 Base) MCG/ACT inhaler 6.7 g 3    Sig: Inhale 2 puffs into the lungs every 6 (six) hours as needed for wheezing or shortness of breath. Needs appointment.  Marland Kitchen amLODipine (NORVASC) 5 MG tablet 90 tablet 3    Sig: Take 1 tablet (5 mg total) by mouth daily.  . fluticasone (FLONASE) 50 MCG/ACT nasal spray 16 g 6    Sig: Place 2 sprays into both nostrils daily.  . metFORMIN (GLUCOPHAGE) 500 MG tablet 180 tablet 1    Sig: Take 1 tablet (500 mg total) by mouth 2 (two) times daily.

## 2018-12-24 ENCOUNTER — Other Ambulatory Visit: Payer: Self-pay | Admitting: Family

## 2018-12-24 MED ORDER — ALBUTEROL SULFATE HFA 108 (90 BASE) MCG/ACT IN AERS: 2.0000 | INHALATION_SPRAY | Freq: Four times a day (QID) | RESPIRATORY_TRACT | 3 refills | Status: DC | PRN

## 2019-01-09 ENCOUNTER — Telehealth: Payer: Self-pay | Admitting: Internal Medicine

## 2019-01-09 ENCOUNTER — Ambulatory Visit (INDEPENDENT_AMBULATORY_CARE_PROVIDER_SITE_OTHER): Payer: Medicare Other | Admitting: Internal Medicine

## 2019-01-09 ENCOUNTER — Other Ambulatory Visit: Payer: Self-pay

## 2019-01-09 ENCOUNTER — Encounter: Payer: Self-pay | Admitting: Internal Medicine

## 2019-01-09 VITALS — BP 189/83 | HR 83 | Temp 96.6°F | Ht 62.0 in | Wt 114.6 lb

## 2019-01-09 DIAGNOSIS — I447 Left bundle-branch block, unspecified: Secondary | ICD-10-CM

## 2019-01-09 DIAGNOSIS — I739 Peripheral vascular disease, unspecified: Secondary | ICD-10-CM

## 2019-01-09 DIAGNOSIS — I10 Essential (primary) hypertension: Secondary | ICD-10-CM | POA: Diagnosis not present

## 2019-01-09 DIAGNOSIS — I6523 Occlusion and stenosis of bilateral carotid arteries: Secondary | ICD-10-CM

## 2019-01-09 DIAGNOSIS — E7801 Familial hypercholesterolemia: Secondary | ICD-10-CM | POA: Diagnosis not present

## 2019-01-09 NOTE — Telephone Encounter (Signed)
Sheral Balboni (Key: [id number]) Repatha Pushtronex System 420MG /3.5ML on-body infusor    Reference Number: [id number]. REPATHA PUSH INJ 420/3.5 is approved through 04/25/2019. For further questions, call [phone].

## 2019-01-09 NOTE — Progress Notes (Signed)
OFFICE NOTE  Chief Complaint:  No complaints  Primary Care Physician: [name], FNP  HPI:  Diane Richmond is a 73 y.o. female who is currently referred to me by Charlann Noss, FNP, for evaluation of elevated cholesterol. I reviewed several years of cholesterol data as of 2013. Currently her total cholesterol is 302 which compares to a total cholesterol 266 3 years ago. Her triglycerides are 167, HDL 31, LDL 237, and non-HDL cholesterol of 270. She had a direct LDL over 200 several years ago. In addition she has type 2 diabetes, hypertension, asthma, and an abnormal EKG demonstrating left bundle branch block. She denies any chest pain or worsening shortness of breath but has had some worsening fatigue and decreased exercise tolerance, particularly over the last 6 months. Her family history is significant for mother who died of massive heart attack at age 52 and father who had a stroke and died at age 80. She also had a sister who died of stroke. In the past she had been on Lipitor but had significant myalgias with this and was switched to Crestor 20 mg daily. She said this also bothered her and lower dose was recommended. Blood pressure is up today at 162/86 but she reports being nervous. She's never been a smoker and denies alcohol use. Her sleepiness score was 0. She mentions that she does get pain in her legs when walking certain distances that gets better when she rests.  01/29/2016  Diane Richmond returns today for follow-up. Unfortunately she did not make routine follow-up and we were unable to contact her for several months. We did relay the results of her extensive testing by mail. She was scheduled for follow-up appointment which she did not make and a referral to my partner which she canceled. She returns today continues to have symptoms concerning for claudication. She underwent a nuclear stress test which was negative for ischemia and demonstrated normal LV function with an EF of  64%. Echocardiogram also showed an EF of 60-65%, grade 1 diastolic dysfunction, mild TR and PI with an RVSP of 31 mmHg. She had carotid Dopplers which showed mild bilateral carotid artery stenosis. Of note, she did have lower extremity arterial Dopplers which showed moderate to severe bilateral lower extremity arterial insufficiency. ABIs were 0.24 and the right and 0.4 on the left. Based on these findings I referred her to Dr. Allyson Sabal for evaluation and possible angiogram however she did not make this appointment. I also recommended additional lab work including an extended lipid profile as she has been intolerant to statins. We had to decrease her Crestor to 10 mg daily which she seems to be tolerating along with ezetimibe. She was being considered for Repatha and this will need to be reinitiated. 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  06/24/2016  Diane Richmond returns today for follow-up. She reports she does get some pain in her legs when she walks but is not lifestyle limiting at this point. She recently saw Dr. Allyson Sabal who talked about lower extremity angiogram however she declined and wanted to see how she did with medical therapy. Unfortunate she's not even taking daily aspirin. I highly encouraged that today. In addition we've been working on lowering cholesterol. She's currently only able to tolerate Crestor 10 mg alternating with 20 mg and then 10 mg 3 times a week. I would like to recheck her cholesterol to see where she is at currently. The plan is if she is not at goal LDL less  than 70 and non-HDL cholesterol less than 100, then we would need to consider adding medication such neuropathic. There is clearly a survival benefit in patients as well as a decreased risk of critical limb ischemia that was demonstrated in the FOURIER trial.  12/14/2016  Diane Richmond returns today for follow-up. We repeated her lipid profile and pleased to say she's had marked improvement. Total cholesterol has decreased  from 300 to 175 over the past year. Triglycerides are down from 167-63. HDL is increased from 31-37 and LDL-C has decreased from 237-125. She's currently on ezetimibe 10 mg daily and rosuvastatin 10 mg daily. She reports she cannot take increased doses of rosuvastatin secondary to myalgias and was completely intolerant of even low doses of atorvastatin. Although she's had marked and significant improvement in her LDL cholesterol, her goal LDL-C is less than 70 given the fact she has significant peripheral arterial disease with decreased ABIs. I discussed this with her at length today and feel that she is a good candidate for PCSK9 inhibitor. We discussed options and she felt that once monthly dosing with her path via the pump is the best option for her.  06/29/2017  Diane Richmond returns today for follow-up.  Overall she seems to be doing well.  She is noted recently that her blood pressure was elevated.  Her daughter brought her blood pressure cuff.  Fortunately she was recently started on Repatha.  She says she took her last dose in January, but there was a problem with renewing the medication and she did not have a dose in February.  Prior to that she had a marked reduction as previously described in her LDL cholesterol to 125.  This is still much higher than her target less than 70 however marks a significant reduction from her LDL cholesterol which was greater than 300.  Her most recent labs in November 2018 showed total cholesterol 143, LDL 58, triglycerides 241 and HDL 37.  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  In addition, we discussed this further today.  She does have 4 children, 3 of which have had difficulty with elevated cholesterol.  Her daughter, who accompanied her today said it is not an issue for her.  We discussed the possibility of genetic screening as she has a number of grandchildren and even a great grandchild who certainly could be at risk for genetic  dyslipidemia.  She denies any new symptoms of claudication, chest pain or worsening shortness of breath.  Blood pressure at home has been in the 140s and 150s and was noted to be 480 systolic today.  06/26/2018  Diane Richmond has done well on the [place] Pushtronex system.  Her most recent lipid profile this month showed total cholesterol 92, triglycerides 60, HDL 39 and LDL 41.  We have achieved approval for her.  She denies any significant claudication.  She says she is active and walks fairly regularly.  She did have high-grade bilateral SFA disease as well as carotid artery disease in the past and is due for repeat Dopplers.  She denies any chest pain.  She does report some congestion and seasonal allergies but other than that no significant shortness of breath.  EKG is stable today shows sinus rhythm and left bundle branch block.  Blood pressure is at goal.  01/09/2019  Diane Richmond is seen today in follow-up.  Unfortunately, she says she has been off of her Repatha probably for the past 5 to 6 months.  Apparently there was a difficulty  affording the medication and she did not get patient assistance.  Although she had had good control of her cholesterol, suspect it is not improved at this point.  Previously her A1c was also well controlled at 6.  She does have significant peripheral arterial disease and had repeat Dopplers recently which showed mild bilateral carotid artery stenosis with left subclavian stenosis and possible steal syndrome.  She seems asymptomatic with this.  Also, she is noted to have moderate bilateral lower extremity arterial disease which is stable.  She says she walks pretty regularly and denies any claudication symptoms.  PMHx:  Past Medical History:  Diagnosis Date  . Asthma   . Bronchitis   . Diabetes mellitus    type II  . HX: breast cancer   . Hyperlipidemia   . Hypertension     Past Surgical History:  Procedure Laterality Date  . BREAST LUMPECTOMY  1994   left  . right  port-a-cath  1994  . TONSILLECTOMY      FAMHx:  Family History  Problem Relation Age of Onset  . Heart disease Mother        CAD/MI  . Aneurysm Father        CVA  . Stroke Sister   . Hypertension Sister   . Breast cancer Unknown   . Diabetes Unknown   . Hypertension Unknown   . Cancer Brother   . Cancer Brother     SOCHx:   reports that she has never smoked. She quit smokeless tobacco use about 55 years ago. She reports that she does not drink alcohol or use drugs.  ALLERGIES:  Allergies  Allergen Reactions  . Promethazine-Codeine [Promethazine-Codeine] Nausea And Vomiting    Pt states cough syrup she can't take  . Atorvastatin Other (See Comments)    REACTION: legs ache    ROS: Pertinent items noted in HPI and remainder of comprehensive ROS otherwise negative.  HOME MEDS: Current Outpatient Medications  Medication Sig Dispense Refill  . albuterol (VENTOLIN HFA) 108 (90 Base) MCG/ACT inhaler Inhale 2 puffs into the lungs every 6 (six) hours as needed for wheezing or shortness of breath. Needs appointment. 6.7 g 3  . amLODipine (NORVASC) 5 MG tablet Take 1 tablet (5 mg total) by mouth daily. 90 tablet 3  . aspirin EC 81 MG tablet Take 81 mg by mouth daily.    . budesonide-formoterol (SYMBICORT) 80-4.5 MCG/ACT inhaler Inhale 2 puffs into the lungs daily. 1 Inhaler 6  . Coenzyme Q10 (COQ10) 50 MG CAPS Take by mouth.    . Evolocumab with Infusor ([place]) 420 MG/3.5ML SOCT Inject 420 mg into the skin every 30 (thirty) days. 3 Cartridge 3  . ezetimibe (ZETIA) 10 MG tablet TAKE 1 TABLET BY MOUTH  DAILY 90 tablet 0  . ezetimibe (ZETIA) 10 MG tablet Take 1 tablet (10 mg total) by mouth daily. 90 tablet 0  . fluticasone (FLONASE) 50 MCG/ACT nasal spray Place 2 sprays into both nostrils daily. 16 g 6  . glucose blood (ACCU-CHEK SMARTVIEW) test strip USE ONE STRIP TO CHECK GLUCOSE ONCE DAILY. NEED TO ESTABLISH WITH NEW PROVIDER. Dx: E11.9 100 each 1  . metFORMIN  (GLUCOPHAGE) 500 MG tablet Take 1 tablet (500 mg total) by mouth 2 (two) times daily. 180 tablet 1  . rosuvastatin (CRESTOR) 10 MG tablet Take 1 tablet by mouth 3 times weekly. 36 tablet 0   No current facility-administered medications for this visit.     LABS/IMAGING: No results found  for this or any previous visit (from the past 48 hour(s)). No results found.  WEIGHTS: Wt Readings from Last 3 Encounters:  01/09/19 114 lb 9.6 oz (52 kg)  12/19/18 111 lb 12.8 oz (50.7 kg)  06/26/18 109 lb (49.4 kg)    VITALS: BP (!) 189/83   Pulse 83   Temp (!) 96.6 F (35.9 C)   Ht 5\' 2"  (1.575 m)   Wt 114 lb 9.6 oz (52 kg)   SpO2 99%   BMI 20.96 kg/m   EXAM: General appearance: alert and no distress Neck: no carotid bruit, no JVD and thyroid not enlarged, symmetric, no tenderness/mass/nodules Lungs: clear to auscultation bilaterally Heart: regular rate and rhythm Abdomen: soft, non-tender; bowel sounds normal; no masses,  no organomegaly Extremities: extremities normal, atraumatic, no cyanosis or edema Pulses: 2+ and symmetric Skin: Skin color, texture, turgor normal. No rashes or lesions Neurologic: Grossly normal Psych: Pleasant  EKG: Normal sinus rhythm at 83, LBBB-personally reviewed  ASSESSMENT: 1. 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 Statin intolerance - ok for rousuvastatin 10 mg TIW 3. Bilateral carotid bruits- mild bilateral carotid stenosis, left subclavian stenosis (2020) 4. Claudication concerning for PAD - moderate, stable (2020) 5. Type 2 diabetes 6. Hypertension 7. LBBB 8. Systolic murmur  PLAN: 1.   Diane Richmond unfortunately is not currently on Repatha and had a good response to it.  This is likely related to cost and we provided her with further information about patient assistance programs that she may qualify for.  I would like her to restart that if possible.  Her carotid artery disease is fairly stable  however it appears that there is possible left subclavian stenosis.  In addition her PAD remains moderate.  Plan follow-up with me in 6 months or sooner as necessary.  Chrystie NoseKenneth C. Damiah Mcdonald, MD, [facility] Memorial Health SystemFACC, FACP  [place]  [facility]  Medical Director of the Advanced Lipid Disorders &  Cardiovascular Risk Reduction Clinic Diplomate of the American Board of Clinical Lipidology Attending Cardiologist  Direct Dial: [phone]  Fax: [phone]  Website:  [email or web address]  Breelynn Bankert C Kassiah Mccrory 01/09/2019, 2:30 PM

## 2019-01-09 NOTE — Patient Instructions (Signed)
Medication Instructions:  RESUME Repatha Pushtronex   [email or web address] >> disease funds >> hypercholesterolemia  If you need a refill on your cardiac medications before your next appointment, please call your pharmacy.   Follow-Up: At [facility], you and your health needs are our priority.  As part of our continuing mission to provide you with exceptional heart care, we have created designated Provider Care Teams.  These Care Teams include your primary Cardiologist (physician) and Advanced Practice Providers (APPs -  Physician Assistants and Nurse Practitioners) who all work together to provide you with the care you need, when you need it. You will need a follow up appointment in 6 months.  Please call our office 2 months in advance to schedule this appointment.  You may see Dr. Debara Pickett or one of the following Advanced Practice Providers on your designated Care Team: Almyra Deforest, [place] . Fabian Sharp, PA-C  Any Other Special Instructions Will Be Listed Below (If Applicable).

## 2019-01-25 ENCOUNTER — Other Ambulatory Visit: Payer: Self-pay | Admitting: Internal Medicine

## 2019-01-30 ENCOUNTER — Ambulatory Visit: Payer: Medicare Other | Admitting: Podiatry

## 2019-01-30 ENCOUNTER — Other Ambulatory Visit: Payer: Self-pay

## 2019-01-30 ENCOUNTER — Encounter: Payer: Self-pay | Admitting: Podiatry

## 2019-01-30 VITALS — BP 172/76 | HR 79

## 2019-01-30 DIAGNOSIS — L989 Disorder of the skin and subcutaneous tissue, unspecified: Secondary | ICD-10-CM

## 2019-01-30 NOTE — Patient Instructions (Signed)
Corn and Callus remover at the Pharmacy for 2 weeks

## 2019-02-03 NOTE — Progress Notes (Signed)
   Subjective: 73 y.o. female with PMHx of T2DM presenting to the office today as a new patient with a chief complaint of a painful callus lesion noted to the left fourth toe that appeared about 1.5 months ago. She states the pain is intermittent and sharp with touch. Wearing shoes increases the pain. She has been filing the area down for treatment which temporarily helps alleviate the symptoms. Patient is here for further evaluation and treatment.    Past Medical History:  Diagnosis Date  . Asthma   . Bronchitis   . Diabetes mellitus    type II  . HX: breast cancer   . Hyperlipidemia   . Hypertension      Objective:  Physical Exam General: Alert and oriented x3 in no acute distress  Dermatology: Hyperkeratotic lesion(s) present on the left fourth toe. Pain on palpation with a central nucleated core noted. Skin is warm, dry and supple bilateral lower extremities. Negative for open lesions or macerations.  Vascular: Palpable pedal pulses bilaterally. No edema or erythema noted. Capillary refill within normal limits.  Neurological: Epicritic and protective threshold grossly intact bilaterally.   Musculoskeletal Exam: Pain on palpation at the keratotic lesion(s) noted. Range of motion within normal limits bilateral. Muscle strength 5/5 in all groups bilateral.  Assessment: 1. Porokeratosis left fourth toe   Plan of Care:  1. Patient evaluated 2. Excisional debridement of keratoic lesion(s) using a chisel blade was performed without incident.  3. Dressed area with light dressing. 4. Recommended OTC corn and callus remover.  5. Recommended good shoe gear.  6. Patient is to return to the clinic PRN.   Edrick Kins, DPM [facility]  Dr. Edrick Kins, [address]                                        [place], [place] [address]                Office [phone]  Fax [phone]

## 2019-02-12 ENCOUNTER — Ambulatory Visit
Admission: RE | Admit: 2019-02-12 | Discharge: 2019-02-12 | Disposition: A | Discharge status: Home / Self Care | Payer: Medicare Other | Source: Ambulatory Visit | Attending: Family | Admitting: Family

## 2019-02-12 ENCOUNTER — Other Ambulatory Visit: Payer: Self-pay

## 2019-02-12 DIAGNOSIS — Z1231 Encounter for screening mammogram for malignant neoplasm of breast: Secondary | ICD-10-CM | POA: Diagnosis not present

## 2019-02-23 ENCOUNTER — Encounter (HOSPITAL_COMMUNITY): Payer: Self-pay | Admitting: Emergency Medicine

## 2019-02-23 ENCOUNTER — Other Ambulatory Visit: Payer: Self-pay

## 2019-02-23 ENCOUNTER — Emergency Department (HOSPITAL_COMMUNITY): Payer: Medicare Other

## 2019-02-23 ENCOUNTER — Emergency Department (HOSPITAL_COMMUNITY)
Admission: EM | Admit: 2019-02-23 | Discharge: 2019-02-23 | Disposition: A | Discharge status: Home / Self Care | Payer: Medicare Other | Attending: Emergency Medicine | Admitting: Emergency Medicine

## 2019-02-23 DIAGNOSIS — R300 Dysuria: Secondary | ICD-10-CM | POA: Diagnosis not present

## 2019-02-23 DIAGNOSIS — R1030 Lower abdominal pain, unspecified: Secondary | ICD-10-CM | POA: Diagnosis not present

## 2019-02-23 DIAGNOSIS — E1151 Type 2 diabetes mellitus with diabetic peripheral angiopathy without gangrene: Secondary | ICD-10-CM | POA: Diagnosis not present

## 2019-02-23 DIAGNOSIS — R1110 Vomiting, unspecified: Secondary | ICD-10-CM | POA: Diagnosis not present

## 2019-02-23 DIAGNOSIS — Z7982 Long term (current) use of aspirin: Secondary | ICD-10-CM | POA: Diagnosis not present

## 2019-02-23 DIAGNOSIS — Z79899 Other long term (current) drug therapy: Secondary | ICD-10-CM | POA: Diagnosis not present

## 2019-02-23 DIAGNOSIS — R112 Nausea with vomiting, unspecified: Secondary | ICD-10-CM | POA: Diagnosis not present

## 2019-02-23 DIAGNOSIS — R110 Nausea: Secondary | ICD-10-CM | POA: Diagnosis not present

## 2019-02-23 DIAGNOSIS — I10 Essential (primary) hypertension: Secondary | ICD-10-CM | POA: Diagnosis not present

## 2019-02-23 DIAGNOSIS — J45909 Unspecified asthma, uncomplicated: Secondary | ICD-10-CM | POA: Diagnosis not present

## 2019-02-23 DIAGNOSIS — Z7984 Long term (current) use of oral hypoglycemic drugs: Secondary | ICD-10-CM | POA: Diagnosis not present

## 2019-02-23 DIAGNOSIS — R1031 Right lower quadrant pain: Secondary | ICD-10-CM | POA: Diagnosis not present

## 2019-02-23 DIAGNOSIS — R1084 Generalized abdominal pain: Secondary | ICD-10-CM | POA: Diagnosis not present

## 2019-02-23 LAB — COMPREHENSIVE METABOLIC PANEL
ALT: 14 U/L (ref 0–44)
AST: 28 U/L (ref 15–41)
Albumin: 4.8 g/dL (ref 3.5–5.0)
Alkaline Phosphatase: 68 U/L (ref 38–126)
Anion gap: 11 (ref 5–15)
BUN: 18 mg/dL (ref 8–23)
CO2: 26 mmol/L (ref 22–32)
Calcium: 9.5 mg/dL (ref 8.9–10.3)
Chloride: 102 mmol/L (ref 98–111)
Creatinine, Ser: 0.74 mg/dL (ref 0.44–1.00)
GFR calc Af Amer: >60 mL/min (ref >60)
GFR calc non Af Amer: >60 mL/min (ref >60)
Glucose, Bld: 144 mg/dL — ABNORMAL HIGH (ref 70–99)
Potassium: 3.4 mmol/L — ABNORMAL LOW (ref 3.5–5.1)
Sodium: 139 mmol/L (ref 135–145)
Total Bilirubin: 0.7 mg/dL (ref 0.3–1.2)
Total Protein: 9.2 g/dL — ABNORMAL HIGH (ref 6.5–8.1)

## 2019-02-23 LAB — LIPASE, BLOOD: Lipase: 37 U/L (ref 11–51)

## 2019-02-23 LAB — URINALYSIS, ROUTINE W REFLEX MICROSCOPIC
Bacteria, UA: NONE SEEN
Bilirubin Urine: NEGATIVE
Glucose, UA: NEGATIVE mg/dL
Hgb urine dipstick: NEGATIVE
Ketones, ur: NEGATIVE mg/dL
Leukocytes,Ua: NEGATIVE
Nitrite: NEGATIVE
Protein, ur: 30 mg/dL — AB
Specific Gravity, Urine: 1.012 (ref 1.005–1.030)
pH: 8 (ref 5.0–8.0)

## 2019-02-23 LAB — CBC WITH DIFFERENTIAL/PLATELET
Abs Immature Granulocytes: 0.03 10*3/uL (ref 0.00–0.07)
Basophils Absolute: 0.1 10*3/uL (ref 0.0–0.1)
Basophils Relative: 1 %
Eosinophils Absolute: 0.4 10*3/uL (ref 0.0–0.5)
Eosinophils Relative: 3 %
HCT: 44.2 % (ref 36.0–46.0)
Hemoglobin: 14 g/dL (ref 12.0–15.0)
Immature Granulocytes: 0 %
Lymphocytes Relative: 11 %
Lymphs Abs: 1.6 10*3/uL (ref 0.7–4.0)
MCH: 30.2 pg (ref 26.0–34.0)
MCHC: 31.7 g/dL (ref 30.0–36.0)
MCV: 95.5 fL (ref 80.0–100.0)
Monocytes Absolute: 0.6 10*3/uL (ref 0.1–1.0)
Monocytes Relative: 4 %
Neutro Abs: 11.6 10*3/uL — ABNORMAL HIGH (ref 1.7–7.7)
Neutrophils Relative %: 81 %
Platelets: 274 10*3/uL (ref 150–400)
RBC: 4.63 MIL/uL (ref 3.87–5.11)
RDW: 12.1 % (ref 11.5–15.5)
WBC: 14.3 10*3/uL — ABNORMAL HIGH (ref 4.0–10.5)
nRBC: 0 % (ref 0.0–0.2)

## 2019-02-23 MED ORDER — IOHEXOL 300 MG/ML  SOLN
100.0000 mL | Freq: Once | INTRAMUSCULAR | Status: AC | PRN
  Administered 2019-02-23: 80 mL via INTRAVENOUS

## 2019-02-23 MED ORDER — SODIUM CHLORIDE 0.9 % IV BOLUS
500.0000 mL | Freq: Once | INTRAVENOUS | Status: AC
  Administered 2019-02-23: 500 mL via INTRAVENOUS

## 2019-02-23 MED ORDER — MORPHINE SULFATE (PF) 4 MG/ML IV SOLN
4.0000 mg | Freq: Once | INTRAVENOUS | Status: AC
  Administered 2019-02-23: 4 mg via INTRAVENOUS
  Filled 2019-02-23: qty 1

## 2019-02-23 MED ORDER — SODIUM CHLORIDE (PF) 0.9 % IJ SOLN
INTRAMUSCULAR | Status: AC
  Administered 2019-02-23: 10:00:00
  Filled 2019-02-23: qty 50

## 2019-02-23 MED ORDER — SODIUM CHLORIDE 0.9 % IV SOLN
INTRAVENOUS | Status: DC
  Administered 2019-02-23: 08:00:00 via INTRAVENOUS

## 2019-02-23 MED ORDER — ONDANSETRON HCL 4 MG/2ML IJ SOLN
4.0000 mg | Freq: Once | INTRAMUSCULAR | Status: AC
  Administered 2019-02-23: 4 mg via INTRAVENOUS
  Filled 2019-02-23: qty 2

## 2019-02-23 NOTE — ED Provider Notes (Signed)
[facility]-EMERGENCY DEPT Provider Note   CSN: [id number] Arrival date & time: 02/23/19  [id number]     History   Chief Complaint Chief Complaint  Patient presents with   Dysuria   Abdominal Pain    HPI Diane Richmond is a 73 y.o. female.     HPI Pt started having pain in her lower abdomen earlier this morning.  It is nagging in the lower abdomen.  She also felt chilled and vomited 2-3 times.   She also felt weak and sweaty.  No diarrhea.  SHe has been urinating frequently but no pain specifically with urination. No blood in the urine.   NO prior abd surgery. Past Medical History:  Diagnosis Date   Asthma    Bronchitis    Diabetes mellitus    type II   HX: breast cancer    Hyperlipidemia    Hypertension     Patient Active Problem List   Diagnosis Date Noted   PAD (peripheral artery disease) (HCC) 01/29/2016   Bilateral carotid bruits 07/31/2015   Claudication (HCC) 07/31/2015   Medicare annual wellness visit, subsequent 06/11/2015   Allergic rhinitis, cause unspecified 09/01/2011   Routine general medical examination at a health care facility 09/01/2011   Other screening mammogram 09/01/2011   LBBB (left bundle branch block) 05/25/2011   Controlled type 2 diabetes mellitus without complication, without long-term current use of insulin (HCC) 03/12/2010   Familial hypercholesteremia 03/12/2010   Essential hypertension 03/12/2010   Asthma 03/12/2010    Past Surgical History:  Procedure Laterality Date   BREAST LUMPECTOMY  1994   left   right port-a-cath  1994   TONSILLECTOMY       OB History   No obstetric history on file.      Home Medications    Prior to Admission medications   Medication Sig Start Date End Date Taking? Authorizing Provider  albuterol (VENTOLIN HFA) 108 (90 Base) MCG/ACT inhaler Inhale 2 puffs into the lungs every 6 (six) hours as needed for wheezing or shortness of breath. Needs appointment.  12/24/18  Yes Olive BassMurray, Laura Woodruff, FNP  amLODipine (NORVASC) 5 MG tablet Take 1 tablet (5 mg total) by mouth daily. 12/19/18  Yes Olive BassMurray, Laura Woodruff, FNP  aspirin EC 81 MG tablet Take 81 mg by mouth daily.   Yes [provider]  budesonide-formoterol (SYMBICORT) 80-4.5 MCG/ACT inhaler Inhale 2 puffs into the lungs daily. 12/19/18  Yes Olive BassMurray, Laura Woodruff, FNP  Coenzyme Q10 (COQ10) 50 MG CAPS Take 50 mg by mouth daily.    Yes [provider]  ezetimibe (ZETIA) 10 MG tablet Take 1 tablet (10 mg total) by mouth daily. 11/19/18  Yes Hilty, Lisette AbuKenneth C, MD  fluticasone (FLONASE) 50 MCG/ACT nasal spray Place 2 sprays into both nostrils daily. 12/19/18  Yes Olive BassMurray, Laura Woodruff, FNP  glucose blood (ACCU-CHEK SMARTVIEW) test strip USE ONE STRIP TO CHECK GLUCOSE ONCE DAILY. NEED TO ESTABLISH WITH NEW PROVIDER. Dx: E11.9 09/10/18  Yes Plotnikov, Georgina QuintAleksei V, MD  metFORMIN (GLUCOPHAGE) 500 MG tablet Take 1 tablet (500 mg total) by mouth 2 (two) times daily. 12/19/18  Yes Olive BassMurray, Laura Woodruff, FNP  rosuvastatin (CRESTOR) 10 MG tablet TAKE 1 TABLET BY MOUTH 3  TIMES WEEKLY Patient taking differently: Take 10 mg by mouth 3 (three) times a week. Take 1 tablet by mouth 3 times weekly. 01/28/19  Yes Hilty, Lisette AbuKenneth C, MD  Evolocumab with Infusor (REPATHA PUSHTRONEX SYSTEM) 420 MG/3.5ML SOCT Inject 420 mg into the  skin every 30 (thirty) days. 12/23/16   Hilty, Lisette Abu, MD    Family History Family History  Problem Relation Age of Onset   Heart disease Mother        CAD/MI   Aneurysm Father        CVA   Stroke Sister    Hypertension Sister    Breast cancer Other    Diabetes Other    Hypertension Other    Cancer Brother    Cancer Brother     Social History Social History   Tobacco Use   Smoking status: Never Smoker   Smokeless tobacco: Former Neurosurgeon  Substance Use Topics   Alcohol use: No   Drug use: No     Allergies   Promethazine-codeine [promethazine-codeine]  and Atorvastatin   Review of Systems Review of Systems  Constitutional: Negative for fever.  Respiratory: Positive for cough (occsnl with allergies). Negative for shortness of breath.   Musculoskeletal: Negative for back pain.  All other systems reviewed and are negative.    Physical Exam Updated Vital Signs BP (!) 147/69 (BP Location: Right Arm)    Pulse 87    Temp 98.8 F (37.1 C) (Oral)    Resp 15    SpO2 96%   Physical Exam Vitals signs and nursing note reviewed.  Constitutional:      General: She is not in acute distress.    Appearance: She is well-developed.  HENT:     Head: Normocephalic and atraumatic.     Right Ear: External ear normal.     Left Ear: External ear normal.  Eyes:     General: No scleral icterus.       Right eye: No discharge.        Left eye: No discharge.     Conjunctiva/sclera: Conjunctivae normal.  Neck:     Musculoskeletal: Neck supple.     Trachea: No tracheal deviation.  Cardiovascular:     Rate and Rhythm: Normal rate and regular rhythm.  Pulmonary:     Effort: Pulmonary effort is normal. No respiratory distress.     Breath sounds: Normal breath sounds. No stridor. No wheezing or rales.  Abdominal:     General: Bowel sounds are normal. There is no distension.     Palpations: Abdomen is soft.     Tenderness: There is abdominal tenderness in the right lower quadrant and suprapubic area. There is no guarding or rebound.     Hernia: No hernia is present.  Musculoskeletal:        General: No tenderness.  Skin:    General: Skin is warm and dry.     Findings: No rash.  Neurological:     Mental Status: She is alert.     Cranial Nerves: No cranial nerve deficit (no facial droop, extraocular movements intact, no slurred speech).     Sensory: No sensory deficit.     Motor: No abnormal muscle tone or seizure activity.     Coordination: Coordination normal.      ED Treatments / Results  Labs (all labs ordered are listed, but only abnormal  results are displayed) Labs Reviewed  COMPREHENSIVE METABOLIC PANEL - Abnormal; Notable for the following components:      Result Value   Potassium 3.4 (*)    Glucose, Bld 144 (*)    Total Protein 9.2 (*)    All other components within normal limits  CBC WITH DIFFERENTIAL/PLATELET - Abnormal; Notable for the following components:   WBC 14.3 (*)  Neutro Abs 11.6 (*)    All other components within normal limits  URINALYSIS, ROUTINE W REFLEX MICROSCOPIC - Abnormal; Notable for the following components:   APPearance HAZY (*)    Protein, ur 30 (*)    All other components within normal limits  URINE CULTURE  LIPASE, BLOOD    EKG None  Radiology Ct Abdomen Pelvis W Contrast  Result Date: 02/23/2019 CLINICAL DATA:  Low pelvic pain beginning early this morning with nausea and vomiting. EXAM: CT ABDOMEN AND PELVIS WITH CONTRAST TECHNIQUE: Multidetector CT imaging of the abdomen and pelvis was performed using the standard protocol following bolus administration of intravenous contrast. CONTRAST:  80mL OMNIPAQUE IOHEXOL 300 MG/ML  SOLN COMPARISON:  None. FINDINGS: Lower chest: No acute abnormality. Hepatobiliary: No focal liver abnormality is seen. No gallstones, gallbladder wall thickening, or biliary dilatation. Pancreas: Mild prominence of the pancreatic duct, 2.5 mm in the pancreatic head. Pancreatic masses. No inflammation. Spleen: Normal in size without focal abnormality. Adrenals/Urinary Tract: No adrenal masses. Kidneys normal in size, orientation and position with symmetric enhancement and excretion. Three homogeneous low-density left renal masses. 6 mm mass at the top of the left kidney, 10 mm mass, lateral midpole and 5 mm mass lateral mid to lower pole, all consistent with cysts. No other masses, no stones and no hydronephrosis. Ureters are normal in course and in caliber. No bladder wall thickening, mass or stone. Stomach/Bowel: Normal stomach. Small bowel normal in caliber with no wall  thickening or inflammation. Colon is normal in caliber. There are numerous diverticula mostly along the sigmoid colon. No diverticulitis. No colonic wall thickening or other inflammatory process. Normal appendix visualized. Vascular/Lymphatic: Aortic atherosclerosis. No aneurysm. No enlarged lymph nodes. Reproductive: Uterus and bilateral adnexa are unremarkable. Other: No abdominal wall hernia. No ascites. There is evidence of pelvic floor relaxation. Musculoskeletal: No fracture or acute finding. No osteoblastic or osteolytic lesions. IMPRESSION: 1. No acute findings. No findings to account for the patient's symptoms. 2. Numerous sigmoid colon diverticula, but no evidence of diverticulitis. 3. Aortic atherosclerosis. Electronically Signed   By: Amie Portland M.D.   On: 02/23/2019 10:15    Procedures Procedures (including critical care time)  Medications Ordered in ED Medications  sodium chloride 0.9 % bolus 500 mL (0 mLs Intravenous Stopped 02/23/19 1007)    And  0.9 %  sodium chloride infusion ( Intravenous New Bag/Given 02/23/19 0742)  ondansetron (ZOFRAN) injection 4 mg (4 mg Intravenous Given 02/23/19 0742)  morphine 4 MG/ML injection 4 mg (4 mg Intravenous Given 02/23/19 0743)  iohexol (OMNIPAQUE) 300 MG/ML solution 100 mL (80 mLs Intravenous Contrast Given 02/23/19 0937)  sodium chloride (PF) 0.9 % injection (  Given by Other 02/23/19 1006)     Initial Impression / Assessment and Plan / ED Course  I have reviewed the triage vital signs and the nursing notes.  Pertinent labs & imaging results that were available during my care of the patient were reviewed by me and considered in my medical decision making (see chart for details).  Clinical Course as of Feb 22 1101  Sat Feb 23, 2019  0825 Labs reviewed.  Patient has an elevated white blood cell count.  She mentioned urinary symptoms but the urinalysis does not suggest a urinary tract infection.  Diverticulitis, colitis is a concern.   We will proceed with CT scan   [JK]  1018 CT scan without acute findings.   [JK]    Clinical Course User Index [JK] Linwood Dibbles, MD  Patient presented with lower abdominal discomfort and urinary symptoms.  Her ED work-up is reassuring.  No signs of definite urinary tract infection.  CT scan was performed because of her symptoms.  No acute findings noted on CT scan.  Etiology of her symptoms are unknown.  However, no signs of any emergency etiology.  I will send off a urine culture because of her urinary symptoms.  Considering her elevated white blood cell count I did recommend follow-up with her primary care doctor and instructed her to return to the ED if she started having fever or other worsening symptoms.  I did discuss Covid testing with the patient .  I have low suspicion , she is not having any respiratory symptoms and she is not interested in having that at this time.    Final Clinical Impressions(s) / ED Diagnoses   Final diagnoses:  Lower abdominal pain    ED Discharge Orders    None       Dorie Rank, MD 02/23/19 1104

## 2019-02-23 NOTE — Discharge Instructions (Addendum)
Take over-the-counter medications and as needed.  Return to the ED for worsening symptoms, fevers.  Follow-up with your doctor next week to be rechecked if you are having residual discomfort.

## 2019-02-23 NOTE — ED Triage Notes (Signed)
Pt from home c/o abd pain and frequent urination onset x last several days

## 2019-02-24 LAB — URINE CULTURE: Culture: <10000 — AB

## 2019-02-27 ENCOUNTER — Other Ambulatory Visit: Payer: Self-pay

## 2019-02-27 ENCOUNTER — Ambulatory Visit (INDEPENDENT_AMBULATORY_CARE_PROVIDER_SITE_OTHER)
Admission: RE | Admit: 2019-02-27 | Discharge: 2019-02-27 | Disposition: A | Discharge status: Home / Self Care | Payer: Medicare Other | Source: Ambulatory Visit | Attending: Family | Admitting: Family

## 2019-02-27 DIAGNOSIS — E2839 Other primary ovarian failure: Secondary | ICD-10-CM | POA: Diagnosis not present

## 2019-03-28 ENCOUNTER — Other Ambulatory Visit: Payer: Self-pay | Admitting: Internal Medicine

## 2019-03-28 ENCOUNTER — Telehealth: Payer: Self-pay | Admitting: Internal Medicine

## 2019-03-28 NOTE — Telephone Encounter (Signed)
Called patient and spoke with daughter. She reports patient has been denied [employer] coverage for 2020. She is concerned that patient has not been on Repatha. She reports [employer] never received a notice that Amgen never got a denial from Medicare.   Explained will submit for a new prior auth and will send link for [employer] Safety Net application to be completed and returned.   Will notify daughter via MyChart once approved with [place]

## 2019-03-29 NOTE — Telephone Encounter (Signed)
Patient is approved for Repatha coverage until 04/24/2020

## 2019-04-01 NOTE — Telephone Encounter (Signed)
From fax dated 06/28/2018: Patient was denied med assistance from [place] net as she should qualify for medicare LIS or Extra Help (phone: [phone]). If she is denied LIS, she can provide documentation to amgen to pursue assistance again

## 2019-05-01 ENCOUNTER — Other Ambulatory Visit: Payer: Self-pay | Admitting: Family

## 2019-05-01 MED ORDER — ACCU-CHEK SMARTVIEW VI STRP: ORAL_STRIP | 0 refills | Status: DC

## 2019-05-01 NOTE — Telephone Encounter (Signed)
Copied from CRM [id number]. Topic: Quick Communication - Rx Refill/Question >> May 01, 2019 12:52 PM Jaquita Rector A wrote: Medication: ACCU-CHEK SMARTVIEW test strip   Has the patient contacted their pharmacy? Yes.   (Agent: If no, request that the patient contact the pharmacy for the refill.) (Agent: If yes, when and what did the pharmacy advise?)  Preferred Pharmacy (with phone number or street name): [facility] - [place], [place] - [address]  Phone:  [phone] Fax:  [phone]     Agent: Please be advised that RX refills may take up to 3 business days. We ask that you follow-up with your pharmacy.

## 2019-06-12 ENCOUNTER — Other Ambulatory Visit: Payer: Self-pay | Admitting: Internal Medicine

## 2019-06-20 ENCOUNTER — Telehealth: Payer: Self-pay | Admitting: Family

## 2019-06-20 NOTE — Telephone Encounter (Signed)
New Message:   Attempted to call patient's daughter back to schedule an appt after seeing she hasn't had one since August 2020. I left a message for her to return call to schedule.

## 2019-06-20 NOTE — Telephone Encounter (Signed)
Medication Requested: metFORMIN (GLUCOPHAGE) 500 MG tablet  Is medication on med list: Yes (if no, inform pt they may need an appointment)  Is medication a controled: (yes = last OV with PCP)  -Controlled Substances: Adderall, Ritalin, oxycodone, hydrocodone, methadone, alprazolam, etc  Last visit with PCP: 12/19/18  Is the OV > than 4 months: Yes (yes = schedule an appt if one is not already made)  Pharmacy (Name, Street, [place]): [facility] - [place], [place] - [address]

## 2019-06-21 ENCOUNTER — Other Ambulatory Visit: Payer: Self-pay | Admitting: Family

## 2019-06-21 MED ORDER — METFORMIN HCL 500 MG PO TABS: 500.0000 mg | ORAL_TABLET | Freq: Two times a day (BID) | ORAL | 1 refills | Status: DC

## 2019-06-21 NOTE — Telephone Encounter (Signed)
New Message:   Pt's daughter is calling and asking can the refill for the metFORMIN (GLUCOPHAGE) 500 MG tablet not be refilled before her appt on 06/26/19. She states she is completely out of this medication. Please advise

## 2019-06-21 NOTE — Telephone Encounter (Signed)
Spoke with patient's daughter today. 

## 2019-06-21 NOTE — Telephone Encounter (Signed)
I refilled it; for some reason, she didn't get the labs done in August that I ordered for her; she really needs to keep her appointment for next week. We need to get Hgba1c updated for her.

## 2019-06-26 ENCOUNTER — Encounter: Payer: Self-pay | Admitting: Family

## 2019-06-26 ENCOUNTER — Other Ambulatory Visit: Payer: Self-pay

## 2019-06-26 ENCOUNTER — Ambulatory Visit (INDEPENDENT_AMBULATORY_CARE_PROVIDER_SITE_OTHER): Payer: Medicare Other | Admitting: Family

## 2019-06-26 VITALS — BP 130/80 | HR 81 | Temp 98.2°F | Ht 62.0 in | Wt 106.4 lb

## 2019-06-26 DIAGNOSIS — I10 Essential (primary) hypertension: Secondary | ICD-10-CM | POA: Diagnosis not present

## 2019-06-26 DIAGNOSIS — R200 Anesthesia of skin: Secondary | ICD-10-CM | POA: Diagnosis not present

## 2019-06-26 DIAGNOSIS — E119 Type 2 diabetes mellitus without complications: Secondary | ICD-10-CM | POA: Diagnosis not present

## 2019-06-26 DIAGNOSIS — J4520 Mild intermittent asthma, uncomplicated: Secondary | ICD-10-CM | POA: Diagnosis not present

## 2019-06-26 DIAGNOSIS — R634 Abnormal weight loss: Secondary | ICD-10-CM | POA: Diagnosis not present

## 2019-06-26 DIAGNOSIS — E7801 Familial hypercholesterolemia: Secondary | ICD-10-CM

## 2019-06-26 LAB — CBC WITH DIFFERENTIAL/PLATELET
Basophils Absolute: 0.1 10*3/uL (ref 0.0–0.1)
Basophils Relative: 1.1 % (ref 0.0–3.0)
Eosinophils Absolute: 0.5 10*3/uL (ref 0.0–0.7)
Eosinophils Relative: 6.2 % — ABNORMAL HIGH (ref 0.0–5.0)
HCT: 36.5 % (ref 36.0–46.0)
Hemoglobin: 12.1 g/dL (ref 12.0–15.0)
Lymphocytes Relative: 22.2 % (ref 12.0–46.0)
Lymphs Abs: 1.8 10*3/uL (ref 0.7–4.0)
MCHC: 33.2 g/dL (ref 30.0–36.0)
MCV: 93.2 fl (ref 78.0–100.0)
Monocytes Absolute: 0.6 10*3/uL (ref 0.1–1.0)
Monocytes Relative: 7.1 % (ref 3.0–12.0)
Neutro Abs: 5 10*3/uL (ref 1.4–7.7)
Neutrophils Relative %: 63.4 % (ref 43.0–77.0)
Platelets: 243 10*3/uL (ref 150.0–400.0)
RBC: 3.91 Mil/uL (ref 3.87–5.11)
RDW: 13.2 % (ref 11.5–15.5)
WBC: 8 10*3/uL (ref 4.0–10.5)

## 2019-06-26 LAB — COMPREHENSIVE METABOLIC PANEL
ALT: 9 U/L (ref 0–35)
AST: 17 U/L (ref 0–37)
Albumin: 3.9 g/dL (ref 3.5–5.2)
Alkaline Phosphatase: 54 U/L (ref 39–117)
BUN: 16 mg/dL (ref 6–23)
CO2: 30 mEq/L (ref 19–32)
Calcium: 10.1 mg/dL (ref 8.4–10.5)
Chloride: 103 mEq/L (ref 96–112)
Creatinine, Ser: 0.68 mg/dL (ref 0.40–1.20)
GFR: 102.57 mL/min (ref >60.00)
Glucose, Bld: 73 mg/dL (ref 70–99)
Potassium: 4 mEq/L (ref 3.5–5.1)
Sodium: 139 mEq/L (ref 135–145)
Total Bilirubin: 0.3 mg/dL (ref 0.2–1.2)
Total Protein: 7.7 g/dL (ref 6.0–8.3)

## 2019-06-26 LAB — VITAMIN B12: Vitamin B-12: 929 pg/mL — ABNORMAL HIGH (ref 211–911)

## 2019-06-26 LAB — HEMOGLOBIN A1C: Hgb A1c MFr Bld: 5.8 % (ref 4.6–6.5)

## 2019-06-26 LAB — TSH: TSH: 0.96 u[IU]/mL (ref 0.35–4.50)

## 2019-06-26 MED ORDER — AMMONIUM LACTATE 12 % EX CREA: TOPICAL_CREAM | CUTANEOUS | 0 refills | Status: DC | PRN

## 2019-06-26 NOTE — Progress Notes (Signed)
Diane Richmond is a 74 y.o. female with the following history as recorded in EpicCare:  Patient Active Problem List   Diagnosis Date Noted  . PAD (peripheral artery disease) ([place]) 01/29/2016  . Bilateral carotid bruits 07/31/2015  . Claudication ([place]) 07/31/2015  . Medicare annual wellness visit, subsequent 06/11/2015  . Allergic rhinitis, cause unspecified 09/01/2011  . Routine general medical examination at a health care facility 09/01/2011  . Other screening mammogram 09/01/2011  . LBBB (left bundle branch block) 05/25/2011  . Controlled type 2 diabetes mellitus without complication, without long-term current use of insulin ([place]) 03/12/2010  . Familial hypercholesteremia 03/12/2010  . Essential hypertension 03/12/2010  . Asthma 03/12/2010    Current Outpatient Medications  Medication Sig Dispense Refill  . albuterol (VENTOLIN HFA) 108 (90 Base) MCG/ACT inhaler Inhale 2 puffs into the lungs every 6 (six) hours as needed for wheezing or shortness of breath. Needs appointment. 6.7 g 3  . amLODipine (NORVASC) 5 MG tablet Take 1 tablet (5 mg total) by mouth daily. 90 tablet 3  . aspirin EC 81 MG tablet Take 81 mg by mouth daily.    . budesonide-formoterol (SYMBICORT) 80-4.5 MCG/ACT inhaler Inhale 2 puffs into the lungs daily. 1 Inhaler 6  . Coenzyme Q10 (COQ10) 50 MG CAPS Take 50 mg by mouth daily.     . Evolocumab with Infusor (Fisher) 420 MG/3.5ML SOCT Inject 420 mg into the skin every 30 (thirty) days. 3 Cartridge 3  . ezetimibe (ZETIA) 10 MG tablet TAKE 1 TABLET BY MOUTH  DAILY 90 tablet 2  . fluticasone (FLONASE) 50 MCG/ACT nasal spray Place 2 sprays into both nostrils daily. 16 g 6  . glucose blood (ACCU-CHEK SMARTVIEW) test strip USE 1 STRIP TO CHECK GLUCOSE ONCE DAILY 100 each 0  . metFORMIN (GLUCOPHAGE) 500 MG tablet Take 1 tablet (500 mg total) by mouth 2 (two) times daily. 180 tablet 1  . rosuvastatin (CRESTOR) 10 MG tablet TAKE 1 TABLET BY MOUTH 3  TIMES  WEEKLY (Patient taking differently: Take 10 mg by mouth 3 (three) times a week. Take 1 tablet by mouth 3 times weekly.) 36 tablet 3  . ammonium lactate (LAC-HYDRIN) 12 % cream Apply topically as needed for dry skin. 385 g 0   No current facility-administered medications for this visit.    Allergies: Promethazine-codeine [promethazine-codeine] and Atorvastatin  Past Medical History:  Diagnosis Date  . Asthma   . Bronchitis   . Diabetes mellitus    type II  . HX: breast cancer   . Hyperlipidemia   . Hypertension     Past Surgical History:  Procedure Laterality Date  . BREAST LUMPECTOMY  1994   left  . right port-a-cath  1994  . TONSILLECTOMY      Family History  Problem Relation Age of Onset  . Heart disease Mother        CAD/MI  . Aneurysm Father        CVA  . Stroke Sister   . Hypertension Sister   . Breast cancer Other   . Diabetes Other   . Hypertension Other   . Cancer Brother   . Cancer Brother     Social History   Tobacco Use  . Smoking status: Never Smoker  . Smokeless tobacco: Former Network engineer Use Topics  . Alcohol use: No    Subjective:  6 month follow-up on chronic care needs; 1) Hyperlipidemia- managed by cardiology; 2) Hyperlipidemia- on Amlodipine daily; 3) Type 2  Diabetes- taking Metformin bid; patient did not get labs done at August OV as requested; agrees to update today; 4) Asthma- stable on Symbicort;  Has actually lost 6 pounds since OV in August- had discussed need for weight gain; admits that she is just not a "big eater"; denies any nausea or vomiting or night sweats;   Objective:  Vitals:   06/26/19 1242  BP: 130/80  Pulse: 81  Temp: 98.2 F (36.8 C)  TempSrc: Oral  SpO2: 99%  Weight: 106 lb 6.4 oz (48.3 kg)  Height: '5\' 2"'$  (1.575 m)    General: Well developed, well nourished, in no acute distress  Skin : Warm and dry.  Head: Normocephalic and atraumatic  Eyes: Sclera and conjunctiva clear; pupils round and reactive to  light; extraocular movements intact  Ears: External normal; canals clear; tympanic membranes normal  Oropharynx: Pink, supple. No suspicious lesions  Neck: Supple without thyromegaly, adenopathy  Lungs: Respirations unlabored; clear to auscultation bilaterally without wheeze, rales, rhonchi  CVS exam: normal rate and regular rhythm.  Vessels: Symmetric bilaterally  Neurologic: Alert and oriented; speech intact; face symmetrical; moves all extremities well; CNII-XII intact without focal deficit   Assessment:  1. Controlled type 2 diabetes mellitus without complication, without long-term current use of insulin ([place])   2. Weight loss   3. Numbness   4. Essential hypertension   5. Mild intermittent asthma without complication   6. Familial hypercholesteremia     Plan:  1. Stay on Metformin as prescribed; update labs today; 2. Check TSH; may need to update CXR and abd/ pelvic CT; follow-up to be determined; 3. Check B12; 4. Stable; stay on same medication; 5. Stable; stay on Symbicort; she will use 2 puffs bid during allergy season; in general, only takes once per day; 6. Keep planned follow up with cardiology;  This visit occurred during the SARS-CoV-2 public health emergency.  Safety protocols were in place, including screening questions prior to the visit, additional usage of staff PPE, and extensive cleaning of exam room while observing appropriate contact time as indicated for disinfecting solutions.       No follow-ups on file.  Orders Placed This Encounter  Procedures  . CBC w/Diff  . Comp Met (CMET)  . HgB A1c  . TSH  . B12    Requested Prescriptions   Signed Prescriptions Disp Refills  . ammonium lactate (LAC-HYDRIN) 12 % cream 385 g 0    Sig: Apply topically as needed for dry skin.

## 2019-07-05 ENCOUNTER — Ambulatory Visit: Payer: Medicare Other | Attending: Internal Medicine

## 2019-07-05 DIAGNOSIS — Z23 Encounter for immunization: Secondary | ICD-10-CM

## 2019-07-05 NOTE — Progress Notes (Signed)
   Covid-19 Vaccination Clinic  Name:  Diane Richmond    MRN: [id number] DOB: 10/30/1945  07/05/2019  [name] was observed post Covid-19 immunization for 15 minutes without incident. She was provided with Vaccine Information Sheet and instruction to access the V-Safe system.   [name] was instructed to call 911 with any severe reactions post vaccine: Marland Kitchen Difficulty breathing  . Swelling of face and throat  . A fast heartbeat  . A bad rash all over body  . Dizziness and weakness   Immunizations Administered    Name Date Dose VIS Date Route   Pfizer COVID-19 Vaccine 07/05/2019  3:08 PM 0.3 mL 04/05/2019 Intramuscular   Manufacturer: [employer], [employer]   Lot: [id number]   NDC: [address]-6

## 2019-07-17 ENCOUNTER — Telehealth: Payer: Self-pay | Admitting: Internal Medicine

## 2019-07-17 NOTE — Telephone Encounter (Signed)
LMOM RE: F/U Visit--- AF 

## 2019-07-25 ENCOUNTER — Telehealth: Payer: Self-pay

## 2019-07-25 DIAGNOSIS — I447 Left bundle-branch block, unspecified: Secondary | ICD-10-CM

## 2019-07-25 DIAGNOSIS — E7801 Familial hypercholesterolemia: Secondary | ICD-10-CM

## 2019-07-25 NOTE — Telephone Encounter (Signed)
pts daughter called and left a msg stating that they haven't had the chance to fill out the repatha paperwork and would like a call back. Will route to [employer]

## 2019-07-25 NOTE — Telephone Encounter (Signed)
Left message to call back  

## 2019-07-30 MED ORDER — REPATHA PUSHTRONEX SYSTEM 420 MG/3.5ML ~~LOC~~ SOCT: 420.0000 mg | SUBCUTANEOUS | 3 refills | Status: DC

## 2019-07-30 NOTE — Telephone Encounter (Signed)
Zola Button (Glenys's daughter) returning call to [place]. Please call her back at [phone]

## 2019-07-30 NOTE — Telephone Encounter (Signed)
Left message to call back  

## 2019-07-30 NOTE — Telephone Encounter (Addendum)
Spoke with daughter Artelia Laroche. Explained that repatha pushtronex is approved until 04/24/2020 - which was reviewed during 03/2019 phone note. Patient's daughter has not applied for medicare LIS or healthwell grant, both she was notified of by myself and Haleigh CMA. Explained that [employer] application can be applied for again if denied LIS, but approvals for AMS have been sparse compared to years past. Explained that cost of medication has decreased and suggested that Rx be sent to pharmacy, they will contact them with the co-pay, and then patient should apply for healthwell grant (info provided). Daughter agreed w/plan. Rx sent to [facility]  Appointment for April cancelled so that patient can take medication for a about 3 months and then have fasting labs and f/u with MD

## 2019-07-30 NOTE — Addendum Note (Signed)
Addended by: Lindell Spar on: 07/30/2019 01:50 PM   Modules accepted: Orders

## 2019-07-31 ENCOUNTER — Ambulatory Visit: Payer: Medicare Other | Attending: Internal Medicine

## 2019-07-31 DIAGNOSIS — Z23 Encounter for immunization: Secondary | ICD-10-CM

## 2019-07-31 NOTE — Progress Notes (Signed)
   Covid-19 Vaccination Clinic  Name:  Diane Richmond    MRN: [id number] DOB: 1945/09/13  07/31/2019  [name] was observed post Covid-19 immunization for 15 minutes without incident. She was provided with Vaccine Information Sheet and instruction to access the V-Safe system.   [name] was instructed to call 911 with any severe reactions post vaccine: Marland Kitchen Difficulty breathing  . Swelling of face and throat  . A fast heartbeat  . A bad rash all over body  . Dizziness and weakness   Immunizations Administered    Name Date Dose VIS Date Route   Pfizer COVID-19 Vaccine 07/31/2019  1:46 PM 0.3 mL 04/05/2019 Intramuscular   Manufacturer: [employer], [employer]   Lot: [id number]   NDC: [address]-0

## 2019-08-15 ENCOUNTER — Ambulatory Visit: Payer: Medicare Other | Admitting: Internal Medicine

## 2019-08-28 ENCOUNTER — Encounter: Payer: Self-pay | Admitting: Family

## 2019-08-28 ENCOUNTER — Other Ambulatory Visit: Payer: Self-pay

## 2019-08-28 ENCOUNTER — Ambulatory Visit (INDEPENDENT_AMBULATORY_CARE_PROVIDER_SITE_OTHER): Payer: Medicare Other | Admitting: Family

## 2019-08-28 VITALS — BP 144/76 | HR 86 | Temp 98.1°F | Ht 62.0 in | Wt 109.4 lb

## 2019-08-28 DIAGNOSIS — J4520 Mild intermittent asthma, uncomplicated: Secondary | ICD-10-CM | POA: Diagnosis not present

## 2019-08-28 DIAGNOSIS — R636 Underweight: Secondary | ICD-10-CM

## 2019-08-28 DIAGNOSIS — E119 Type 2 diabetes mellitus without complications: Secondary | ICD-10-CM | POA: Diagnosis not present

## 2019-08-28 NOTE — Progress Notes (Signed)
Diane Richmond is a 74 y.o. female with the following history as recorded in EpicCare:  Patient Active Problem List   Diagnosis Date Noted  . PAD (peripheral artery disease) ([place]) 01/29/2016  . Bilateral carotid bruits 07/31/2015  . Claudication ([place]) 07/31/2015  . Medicare annual wellness visit, subsequent 06/11/2015  . Allergic rhinitis, cause unspecified 09/01/2011  . Routine general medical examination at a health care facility 09/01/2011  . Other screening mammogram 09/01/2011  . LBBB (left bundle branch block) 05/25/2011  . Controlled type 2 diabetes mellitus without complication, without long-term current use of insulin ([place]) 03/12/2010  . Familial hypercholesteremia 03/12/2010  . Essential hypertension 03/12/2010  . Asthma 03/12/2010    Current Outpatient Medications  Medication Sig Dispense Refill  . albuterol (VENTOLIN HFA) 108 (90 Base) MCG/ACT inhaler Inhale 2 puffs into the lungs every 6 (six) hours as needed for wheezing or shortness of breath. Needs appointment. 6.7 g 3  . amLODipine (NORVASC) 5 MG tablet Take 1 tablet (5 mg total) by mouth daily. 90 tablet 3  . aspirin EC 81 MG tablet Take 81 mg by mouth daily.    . budesonide-formoterol (SYMBICORT) 80-4.5 MCG/ACT inhaler Inhale 2 puffs into the lungs daily. 1 Inhaler 6  . Coenzyme Q10 (COQ10) 50 MG CAPS Take 50 mg by mouth daily.     . Evolocumab with Infusor ([place]) 420 MG/3.5ML SOCT Inject 420 mg into the skin every 30 (thirty) days. 3 mL 3  . ezetimibe (ZETIA) 10 MG tablet TAKE 1 TABLET BY MOUTH  DAILY 90 tablet 2  . fluticasone (FLONASE) 50 MCG/ACT nasal spray Place 2 sprays into both nostrils daily. 16 g 6  . glucose blood (ACCU-CHEK SMARTVIEW) test strip USE 1 STRIP TO CHECK GLUCOSE ONCE DAILY 100 each 0  . metFORMIN (GLUCOPHAGE) 500 MG tablet Take 1 tablet (500 mg total) by mouth 2 (two) times daily. 180 tablet 1  . rosuvastatin (CRESTOR) 10 MG tablet TAKE 1 TABLET BY MOUTH 3  TIMES WEEKLY  (Patient taking differently: Take 10 mg by mouth 3 (three) times a week. Take 1 tablet by mouth 3 times weekly.) 36 tablet 3   No current facility-administered medications for this visit.    Allergies: Promethazine-codeine [promethazine-codeine] and Atorvastatin  Past Medical History:  Diagnosis Date  . Asthma   . Bronchitis   . Diabetes mellitus    type II  . HX: breast cancer   . Hyperlipidemia   . Hypertension     Past Surgical History:  Procedure Laterality Date  . BREAST LUMPECTOMY  1994   left  . right port-a-cath  1994  . TONSILLECTOMY      Family History  Problem Relation Age of Onset  . Heart disease Mother        CAD/MI  . Aneurysm Father        CVA  . Stroke Sister   . Hypertension Sister   . Breast cancer Other   . Diabetes Other   . Hypertension Other   . Cancer Brother   . Cancer Brother     Social History   Tobacco Use  . Smoking status: Never Smoker  . Smokeless tobacco: Former Network engineer Use Topics  . Alcohol use: No    Subjective:  2 month follow-up on weight; has gained 3 pounds since last OV; trying to work on eating more calories/ drinking Ensure; is feeling better; has no acute concerns; does feel that her asthma is stable- not having to  use her rescue inhaler.   Objective:  Vitals:   08/28/19 1255  BP: (!) 144/76  Pulse: 86  Temp: 98.1 F (36.7 C)  TempSrc: Oral  SpO2: 98%  Weight: 109 lb 6.4 oz (49.6 kg)  Height: 5\' 2"  (1.575 m)    General: Well developed, well nourished, in no acute distress  Skin : Warm and dry.  Head: Normocephalic and atraumatic  Lungs: Respirations unlabored; clear to auscultation bilaterally without wheeze, rales, rhonchi  CVS exam: normal rate and regular rhythm.  Neurologic: Alert and oriented; speech intact; face symmetrical; moves all extremities well; CNII-XII intact without focal deficit   Assessment:  1. Underweight   2. Controlled type 2 diabetes mellitus without complication, without  long-term current use of insulin ([facility])   3. Mild intermittent asthma without complication     Plan:  Continue working on increased caloric intake; re-check in 3 months; will update labs at that time; goal weight at next OV is 113-114 minimum. Stable diabetes control; Not having to use rescue inhaler;   This visit occurred during the SARS-CoV-2 public health emergency.  Safety protocols were in place, including screening questions prior to the visit, additional usage of staff PPE, and extensive cleaning of exam room while observing appropriate contact time as indicated for disinfecting solutions.     Return in about 3 months (around 11/28/2019).  No orders of the defined types were placed in this encounter.   Requested Prescriptions    No prescriptions requested or ordered in this encounter

## 2019-08-28 NOTE — Patient Instructions (Signed)
YEAH!! You gained 3 pounds!! Keep working on [employer] and eating more.

## 2019-09-12 ENCOUNTER — Other Ambulatory Visit: Payer: Self-pay | Admitting: Family

## 2019-09-13 ENCOUNTER — Telehealth: Payer: Self-pay | Admitting: Internal Medicine

## 2019-09-13 NOTE — Telephone Encounter (Signed)
Spoke with patient 09/13/19 to get appt schedule, pt stated daughter would call back to make appt

## 2019-11-26 ENCOUNTER — Other Ambulatory Visit: Payer: Self-pay | Admitting: Family

## 2019-11-26 ENCOUNTER — Other Ambulatory Visit: Payer: Self-pay | Admitting: Internal Medicine

## 2019-11-26 DIAGNOSIS — I10 Essential (primary) hypertension: Secondary | ICD-10-CM

## 2019-12-24 ENCOUNTER — Other Ambulatory Visit: Payer: Self-pay | Admitting: Family

## 2019-12-25 DIAGNOSIS — J019 Acute sinusitis, unspecified: Secondary | ICD-10-CM | POA: Diagnosis not present

## 2019-12-31 DIAGNOSIS — J019 Acute sinusitis, unspecified: Secondary | ICD-10-CM | POA: Diagnosis not present

## 2020-01-17 ENCOUNTER — Other Ambulatory Visit: Payer: Self-pay | Admitting: Family

## 2020-01-27 ENCOUNTER — Other Ambulatory Visit: Payer: Self-pay | Admitting: Family

## 2020-01-27 DIAGNOSIS — Z1231 Encounter for screening mammogram for malignant neoplasm of breast: Secondary | ICD-10-CM

## 2020-01-28 ENCOUNTER — Other Ambulatory Visit: Payer: Self-pay | Admitting: Internal Medicine

## 2020-02-05 ENCOUNTER — Encounter: Payer: Self-pay | Admitting: Family

## 2020-02-05 ENCOUNTER — Ambulatory Visit (INDEPENDENT_AMBULATORY_CARE_PROVIDER_SITE_OTHER): Payer: Medicare Other | Admitting: Family

## 2020-02-05 ENCOUNTER — Other Ambulatory Visit: Payer: Self-pay

## 2020-02-05 VITALS — BP 136/76 | HR 84 | Temp 98.2°F | Ht 62.0 in | Wt 115.2 lb

## 2020-02-05 DIAGNOSIS — E7801 Familial hypercholesterolemia: Secondary | ICD-10-CM | POA: Diagnosis not present

## 2020-02-05 DIAGNOSIS — I10 Essential (primary) hypertension: Secondary | ICD-10-CM | POA: Diagnosis not present

## 2020-02-05 DIAGNOSIS — E119 Type 2 diabetes mellitus without complications: Secondary | ICD-10-CM

## 2020-02-05 LAB — CBC WITH DIFFERENTIAL/PLATELET
Basophils Absolute: 0.1 10*3/uL (ref 0.0–0.1)
Basophils Relative: 1.1 % (ref 0.0–3.0)
Eosinophils Absolute: 0.7 10*3/uL (ref 0.0–0.7)
Eosinophils Relative: 10.6 % — ABNORMAL HIGH (ref 0.0–5.0)
HCT: 40.4 % (ref 36.0–46.0)
Hemoglobin: 13 g/dL (ref 12.0–15.0)
Lymphocytes Relative: 27 % (ref 12.0–46.0)
Lymphs Abs: 1.7 10*3/uL (ref 0.7–4.0)
MCHC: 32.1 g/dL (ref 30.0–36.0)
MCV: 94.6 fl (ref 78.0–100.0)
Monocytes Absolute: 0.5 10*3/uL (ref 0.1–1.0)
Monocytes Relative: 8.7 % (ref 3.0–12.0)
Neutro Abs: 3.3 10*3/uL (ref 1.4–7.7)
Neutrophils Relative %: 52.6 % (ref 43.0–77.0)
Platelets: 229 10*3/uL (ref 150.0–400.0)
RBC: 4.27 Mil/uL (ref 3.87–5.11)
RDW: 13.2 % (ref 11.5–15.5)
WBC: 6.2 10*3/uL (ref 4.0–10.5)

## 2020-02-05 LAB — COMPREHENSIVE METABOLIC PANEL
ALT: 16 U/L (ref 0–35)
AST: 22 U/L (ref 0–37)
Albumin: 4.2 g/dL (ref 3.5–5.2)
Alkaline Phosphatase: 63 U/L (ref 39–117)
BUN: 14 mg/dL (ref 6–23)
CO2: 31 mEq/L (ref 19–32)
Calcium: 9.4 mg/dL (ref 8.4–10.5)
Chloride: 104 mEq/L (ref 96–112)
Creatinine, Ser: 0.66 mg/dL (ref 0.40–1.20)
GFR: 87.13 mL/min (ref >60.00)
Glucose, Bld: 108 mg/dL — ABNORMAL HIGH (ref 70–99)
Potassium: 3.6 mEq/L (ref 3.5–5.1)
Sodium: 141 mEq/L (ref 135–145)
Total Bilirubin: 0.4 mg/dL (ref 0.2–1.2)
Total Protein: 7.9 g/dL (ref 6.0–8.3)

## 2020-02-05 LAB — HEMOGLOBIN A1C: Hgb A1c MFr Bld: 6.3 % (ref 4.6–6.5)

## 2020-02-05 NOTE — Progress Notes (Signed)
Diane Richmond is a 74 y.o. female with the following history as recorded in EpicCare:  Patient Active Problem List   Diagnosis Date Noted  . PAD (peripheral artery disease) ([place]) 01/29/2016  . Bilateral carotid bruits 07/31/2015  . Claudication ([place]) 07/31/2015  . Medicare annual wellness visit, subsequent 06/11/2015  . Allergic rhinitis, cause unspecified 09/01/2011  . Routine general medical examination at a health care facility 09/01/2011  . Other screening mammogram 09/01/2011  . LBBB (left bundle branch block) 05/25/2011  . Controlled type 2 diabetes mellitus without complication, without long-term current use of insulin ([place]) 03/12/2010  . Familial hypercholesteremia 03/12/2010  . Essential hypertension 03/12/2010  . Asthma 03/12/2010    Current Outpatient Medications  Medication Sig Dispense Refill  . ACCU-CHEK SMARTVIEW test strip USE 1 STRIP TO CHECK GLUCOSE ONCE DAILY 100 each 2  . albuterol (VENTOLIN HFA) 108 (90 Base) MCG/ACT inhaler Inhale 2 puffs into the lungs every 6 (six) hours as needed for wheezing or shortness of breath. Needs appointment. 6.7 g 3  . amLODipine (NORVASC) 5 MG tablet TAKE 1 TABLET BY MOUTH  DAILY 90 tablet 3  . aspirin EC 81 MG tablet Take 81 mg by mouth daily.    . budesonide-formoterol (SYMBICORT) 80-4.5 MCG/ACT inhaler Inhale 2 puffs into the lungs daily. 1 Inhaler 6  . Coenzyme Q10 (COQ10) 50 MG CAPS Take 50 mg by mouth daily.     . Evolocumab with Infusor ([place]) 420 MG/3.5ML SOCT Inject 420 mg into the skin every 30 (thirty) days. 3 mL 3  . ezetimibe (ZETIA) 10 MG tablet TAKE 1 TABLET BY MOUTH  DAILY 90 tablet 0  . fluticasone (FLONASE) 50 MCG/ACT nasal spray Use 2 spray(s) in each nostril once daily 16 g 0  . metFORMIN (GLUCOPHAGE) 500 MG tablet Take 1 tablet by mouth twice daily 180 tablet 0  . rosuvastatin (CRESTOR) 10 MG tablet TAKE 1 TABLET BY MOUTH 3  TIMES WEEKLY 39 tablet 3   No current facility-administered  medications for this visit.    Allergies: Promethazine-codeine [promethazine-codeine] and Atorvastatin  Past Medical History:  Diagnosis Date  . Asthma   . Bronchitis   . Diabetes mellitus    type II  . HX: breast cancer   . Hyperlipidemia   . Hypertension     Past Surgical History:  Procedure Laterality Date  . BREAST LUMPECTOMY  1994   left  . right port-a-cath  1994  . TONSILLECTOMY      Family History  Problem Relation Age of Onset  . Heart disease Mother        CAD/MI  . Aneurysm Father        CVA  . Stroke Sister   . Hypertension Sister   . Breast cancer Other   . Diabetes Other   . Hypertension Other   . Cancer Brother   . Cancer Brother     Social History   Tobacco Use  . Smoking status: Never Smoker  . Smokeless tobacco: Former Network engineer Use Topics  . Alcohol use: No    Subjective:  Follow-up for weight re-check; last OV was 08/2019- weighed 109 pounds at that time; goal for next appointment was around 113 pounds; today she is up to 115 pounds; this is fairly consistent with her normal weight for the past few years; is trying to drink Ensure regularly- feeling good in general but does occasionally feel like her energy is down;      Objective:  Vitals:   02/05/20 1050  BP: 136/76  Pulse: 84  Temp: 98.2 F (36.8 C)  TempSrc: Oral  SpO2: 99%  Weight: 115 lb 3.2 oz (52.3 kg)  Height: $Remove'5\' 2"'atyhpll$  (1.575 m)    General: Well developed, well nourished, in no acute distress  Skin : Warm and dry.  Head: Normocephalic and atraumatic  Lungs: Respirations unlabored; clear to auscultation bilaterally without wheeze, rales, rhonchi  CVS exam: normal rate and regular rhythm.  Neurologic: Alert and oriented; speech intact; face symmetrical; moves all extremities well; CNII-XII intact without focal deficit   Assessment:  1. Controlled type 2 diabetes mellitus without complication, without long-term current use of insulin ([place])   2. Essential hypertension    3. Familial hypercholesteremia     Plan:  1. Based on last Hgba1c, am suspicious that patient may be having low blood sugar; will update labs today and is still at or below 6, will plan to hold Metformin and see how patient responds; she is in agreement with this treatment plan; hold Metformin for now;  2. Stable; continue same medication; 3. Continue with Dr. Debara Pickett as scheduled;   This visit occurred during the SARS-CoV-2 public health emergency.  Safety protocols were in place, including screening questions prior to the visit, additional usage of staff PPE, and extensive cleaning of exam room while observing appropriate contact time as indicated for disinfecting solutions.     No follow-ups on file.  Orders Placed This Encounter  Procedures  . CBC with Differential/Platelet    Standing Status:   Future    Number of Occurrences:   1    Standing Expiration Date:   02/04/2021  . Comp Met (CMET)    Standing Status:   Future    Number of Occurrences:   1    Standing Expiration Date:   02/04/2021  . Hemoglobin A1c    Standing Status:   Future    Number of Occurrences:   1    Standing Expiration Date:   02/04/2021    Requested Prescriptions    No prescriptions requested or ordered in this encounter

## 2020-02-21 ENCOUNTER — Ambulatory Visit
Admission: RE | Admit: 2020-02-21 | Discharge: 2020-02-21 | Disposition: A | Discharge status: Home / Self Care | Payer: Medicare Other | Source: Ambulatory Visit | Attending: Family | Admitting: Family

## 2020-02-21 ENCOUNTER — Other Ambulatory Visit: Payer: Self-pay

## 2020-02-21 DIAGNOSIS — Z1231 Encounter for screening mammogram for malignant neoplasm of breast: Secondary | ICD-10-CM

## 2020-03-11 ENCOUNTER — Other Ambulatory Visit: Payer: Self-pay | Admitting: Family

## 2020-04-13 ENCOUNTER — Telehealth: Payer: Self-pay | Admitting: Internal Medicine

## 2020-04-13 NOTE — Telephone Encounter (Signed)
PA for repatha pushtronex submitted via CMM (Key: [id number])

## 2020-04-22 NOTE — Telephone Encounter (Signed)
Request Reference Number: [id number]. REPATHA PUSH INJ 420/3.5 is approved through 04/24/2021

## 2020-04-28 ENCOUNTER — Ambulatory Visit: Payer: Medicare Other | Attending: Internal Medicine

## 2020-04-28 ENCOUNTER — Other Ambulatory Visit: Payer: Self-pay

## 2020-04-28 DIAGNOSIS — Z23 Encounter for immunization: Secondary | ICD-10-CM

## 2020-04-28 NOTE — Progress Notes (Signed)
   Covid-19 Vaccination Clinic  Name:  Diane Richmond    MRN: [id number] DOB: 06-11-1945  04/28/2020  [name] was observed post Covid-19 immunization for 15 minutes without incident. She was provided with Vaccine Information Sheet and instruction to access the V-Safe system.   [name] was instructed to call 911 with any severe reactions post vaccine: Marland Kitchen Difficulty breathing  . Swelling of face and throat  . A fast heartbeat  . A bad rash all over body  . Dizziness and weakness   Immunizations Administered    Name Date Dose VIS Date Route   Pfizer COVID-19 Vaccine 04/28/2020  1:45 PM 0.3 mL 02/12/2020 Intramuscular   Manufacturer: [employer], [employer]   Lot: [id number]   NDC: [address]-2

## 2020-05-09 ENCOUNTER — Other Ambulatory Visit: Payer: Self-pay | Admitting: Family

## 2020-05-12 ENCOUNTER — Other Ambulatory Visit: Payer: Self-pay | Admitting: Family

## 2020-05-12 ENCOUNTER — Telehealth: Payer: Self-pay | Admitting: Family

## 2020-05-12 MED ORDER — BLOOD GLUCOSE MONITOR KIT: PACK | 0 refills | Status: AC

## 2020-05-12 NOTE — Telephone Encounter (Signed)
Patients daughter calling stating the patients accu chek is going bad, they have changed the battery and the patient is having to use more strips than usually because it keeps saying error so they are wondering how they can get the patient a new one.

## 2020-05-12 NOTE — Telephone Encounter (Signed)
Totally fine for you to refill but I sent new order to pharmacy.

## 2020-05-21 ENCOUNTER — Other Ambulatory Visit: Payer: Self-pay

## 2020-05-21 ENCOUNTER — Ambulatory Visit (INDEPENDENT_AMBULATORY_CARE_PROVIDER_SITE_OTHER): Payer: Medicare Other | Admitting: Internal Medicine

## 2020-05-21 ENCOUNTER — Encounter: Payer: Self-pay | Admitting: Internal Medicine

## 2020-05-21 ENCOUNTER — Ambulatory Visit: Payer: Medicare Other | Admitting: Internal Medicine

## 2020-05-21 VITALS — BP 147/69 | HR 78 | Ht 62.0 in | Wt 115.2 lb

## 2020-05-21 DIAGNOSIS — I739 Peripheral vascular disease, unspecified: Secondary | ICD-10-CM

## 2020-05-21 DIAGNOSIS — I447 Left bundle-branch block, unspecified: Secondary | ICD-10-CM

## 2020-05-21 DIAGNOSIS — E7801 Familial hypercholesterolemia: Secondary | ICD-10-CM

## 2020-05-21 DIAGNOSIS — I10 Essential (primary) hypertension: Secondary | ICD-10-CM

## 2020-05-21 DIAGNOSIS — I779 Disorder of arteries and arterioles, unspecified: Secondary | ICD-10-CM | POA: Diagnosis not present

## 2020-05-21 MED ORDER — REPATHA PUSHTRONEX SYSTEM 420 MG/3.5ML ~~LOC~~ SOCT: 420.0000 mg | SUBCUTANEOUS | 11 refills | Status: DC

## 2020-05-21 NOTE — Patient Instructions (Signed)
Medication Instructions:  RESUME repatha pushtronex once a month  You can apply for a co-pay grant from: [email or web address] >> disease funds >> hypercholesterolemia  *If you need a refill on your cardiac medications before your next appointment, please call your pharmacy*   Lab Work: FASTING lipid panel in 3-4 months  If you have labs (blood work) drawn today and your tests are completely normal, you will receive your results only by: Marland Kitchen MyChart Message (if you have MyChart) OR . A paper copy in the mail If you have any lab test that is abnormal or we need to change your treatment, we will call you to review the results.   Testing/Procedures: Carotid Doppler & Lower Extremity Arterial Doppler due in July 2022   Follow-Up: At [facility], you and your health needs are our priority.  As part of our continuing mission to provide you with exceptional heart care, we have created designated Provider Care Teams.  These Care Teams include your primary Cardiologist (physician) and Advanced Practice Providers (APPs -  Physician Assistants and Nurse Practitioners) who all work together to provide you with the care you need, when you need it.  We recommend signing up for the patient portal called "MyChart".  Sign up information is provided on this After Visit Summary.  MyChart is used to connect with patients for Virtual Visits (Telemedicine).  Patients are able to view lab/test results, encounter notes, upcoming appointments, etc.  Non-urgent messages can be sent to your provider as well.   To learn more about what you can do with MyChart, go to [email or web address].    Your next appointment:   July 2022 - after doppler testing  The format for your next appointment:   In Person  Provider:   Kirtland Bouchard [place] Hilty, MD - lipid clinic   Other Instructions

## 2020-05-21 NOTE — Progress Notes (Signed)
OFFICE NOTE  Chief Complaint:  No complaints  Primary Care Physician: Marrian Salvage, FNP  HPI:  Diane Richmond is a 75 y.o. female who is currently referred to me by Amie Critchley, FNP, for evaluation of elevated cholesterol. I reviewed several years of cholesterol data as of 2013. Currently her total cholesterol is 302 which compares to a total cholesterol 266 3 years ago. Her triglycerides are 167, HDL 31, LDL 237, and non-HDL cholesterol of 270. She had a direct LDL over 200 several years ago. In addition she has type 2 diabetes, hypertension, asthma, and an abnormal EKG demonstrating left bundle branch block. She denies any chest pain or worsening shortness of breath but has had some worsening fatigue and decreased exercise tolerance, particularly over the last 6 months. Her family history is significant for mother who died of massive heart attack at age 41 and father who had a stroke and died at age 55. She also had a sister who died of stroke. In the past she had been on Lipitor but had significant myalgias with this and was switched to Crestor 20 mg daily. She said this also bothered her and lower dose was recommended. Blood pressure is up today at 162/86 but she reports being nervous. She's never been a smoker and denies alcohol use. Her sleepiness score was 0. She mentions that she does get pain in her legs when walking certain distances that gets better when she rests.  01/29/2016  Diane Richmond returns today for follow-up. Unfortunately she did not make routine follow-up and we were unable to contact her for several months. We did relay the results of her extensive testing by mail. She was scheduled for follow-up appointment which she did not make and a referral to my partner which she canceled. She returns today continues to have symptoms concerning for claudication. She underwent a nuclear stress test which was negative for ischemia and demonstrated normal LV function with an EF of  64%. Echocardiogram also showed an EF of 98-92%, grade 1 diastolic dysfunction, mild TR and PI with an RVSP of 31 mmHg. She had carotid Dopplers which showed mild bilateral carotid artery stenosis. Of note, she did have lower extremity arterial Dopplers which showed moderate to severe bilateral lower extremity arterial insufficiency. ABIs were 0.24 and the right and 0.4 on the left. Based on these findings I referred her to Dr. Gwenlyn Found for evaluation and possible angiogram however she did not make this appointment. I also recommended additional lab work including an extended lipid profile as she has been intolerant to statins. We had to decrease her Crestor to 10 mg daily which she seems to be tolerating along with ezetimibe. She was being considered for Repatha and this will need to be reinitiated. 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  06/24/2016  Diane Richmond returns today for follow-up. She reports she does get some pain in her legs when she walks but is not lifestyle limiting at this point. She recently saw Dr. Gwenlyn Found who talked about lower extremity angiogram however she declined and wanted to see how she did with medical therapy. Unfortunate she's not even taking daily aspirin. I highly encouraged that today. In addition we've been working on lowering cholesterol. She's currently only able to tolerate Crestor 10 mg alternating with 20 mg and then 10 mg 3 times a week. I would like to recheck her cholesterol to see where she is at currently. The plan is if she is not at goal LDL less  than 70 and non-HDL cholesterol less than 100, then we would need to consider adding medication such neuropathic. There is clearly a survival benefit in patients as well as a decreased risk of critical limb ischemia that was demonstrated in the FOURIER trial.  12/14/2016  Diane Richmond returns today for follow-up. We repeated her lipid profile and pleased to say she's had marked improvement. Total cholesterol has decreased  from 300 to 175 over the past year. Triglycerides are down from 167-63. HDL is increased from 31-37 and LDL-C has decreased from 237-125. She's currently on ezetimibe 10 mg daily and rosuvastatin 10 mg daily. She reports she cannot take increased doses of rosuvastatin secondary to myalgias and was completely intolerant of even low doses of atorvastatin. Although she's had marked and significant improvement in her LDL cholesterol, her goal LDL-C is less than 70 given the fact she has significant peripheral arterial disease with decreased ABIs. I discussed this with her at length today and feel that she is a good candidate for PCSK9 inhibitor. We discussed options and she felt that once monthly dosing with her path via the pump is the best option for her.  06/29/2017  Diane Richmond returns today for follow-up.  Overall she seems to be doing well.  She is noted recently that her blood pressure was elevated.  Her daughter brought her blood pressure cuff.  Fortunately she was recently started on Repatha.  She says she took her last dose in January, but there was a problem with renewing the medication and she did not have a dose in February.  Prior to that she had a marked reduction as previously described in her LDL cholesterol to 125.  This is still much higher than her target less than 70 however marks a significant reduction from her LDL cholesterol which was greater than 300.  Her most recent labs in November 2018 showed total cholesterol 143, LDL 58, triglycerides 241 and HDL 37.  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  In addition, we discussed this further today.  She does have 4 children, 3 of which have had difficulty with elevated cholesterol.  Her daughter, who accompanied her today said it is not an issue for her.  We discussed the possibility of genetic screening as she has a number of grandchildren and even a great grandchild who certainly could be at risk for genetic  dyslipidemia.  She denies any new symptoms of claudication, chest pain or worsening shortness of breath.  Blood pressure at home has been in the 140s and 150s and was noted to be 480 systolic today.  06/26/2018  Diane Richmond has done well on the [employer] Pushtronex system.  Her most recent lipid profile this month showed total cholesterol 92, triglycerides 60, HDL 39 and LDL 41.  We have achieved approval for her.  She denies any significant claudication.  She says she is active and walks fairly regularly.  She did have high-grade bilateral SFA disease as well as carotid artery disease in the past and is due for repeat Dopplers.  She denies any chest pain.  She does report some congestion and seasonal allergies but other than that no significant shortness of breath.  EKG is stable today shows sinus rhythm and left bundle branch block.  Blood pressure is at goal.  01/09/2019  Diane Richmond is seen today in follow-up.  Unfortunately, she says she has been off of her Repatha probably for the past 5 to 6 months.  Apparently there was a difficulty  affording the medication and she did not get patient assistance.  Although she had had good control of her cholesterol, suspect it is not improved at this point.  Previously her A1c was also well controlled at 6.  She does have significant peripheral arterial disease and had repeat Dopplers recently which showed mild bilateral carotid artery stenosis with left subclavian stenosis and possible steal syndrome.  She seems asymptomatic with this.  Also, she is noted to have moderate bilateral lower extremity arterial disease which is stable.  She says she walks pretty regularly and denies any claudication symptoms.  05/21/2020  Diane Richmond is seen today in follow-up.  She says she has not been on her Repatha.  In the past she had been off of it for 5 to 6 months.  For some reason this has not been continued.  She says that her daughter had not picked it up or provided it for her.  After speaking  with both of them they are interested in restarting it.  He would need to repeat Dopplers in about 6 months.  She did have some possibly significant left subclavian stenosis, mild carotid artery disease and moderate lower extremity arterial disease but had an excellent response to the Repatha.  She is due for repeat Dopplers in July.  She denies any symptoms of claudication.   PMHx:  Past Medical History:  Diagnosis Date  . Asthma   . Bronchitis   . Diabetes mellitus    type II  . HX: breast cancer   . Hyperlipidemia   . Hypertension     Past Surgical History:  Procedure Laterality Date  . BREAST LUMPECTOMY  1994   left  . right port-a-cath  1994  . TONSILLECTOMY      FAMHx:  Family History  Problem Relation Age of Onset  . Heart disease Mother        CAD/MI  . Aneurysm Father        CVA  . Stroke Sister   . Hypertension Sister   . Breast cancer Other   . Diabetes Other   . Hypertension Other   . Cancer Brother   . Cancer Brother     SOCHx:   reports that she has never smoked. She quit smokeless tobacco use about 57 years ago. She reports that she does not drink alcohol and does not use drugs.  ALLERGIES:  Allergies  Allergen Reactions  . Promethazine-Codeine [Promethazine-Codeine] Nausea And Vomiting    Pt states cough syrup she can't take  . Atorvastatin Other (See Comments)    REACTION: legs ache    ROS: Pertinent items noted in HPI and remainder of comprehensive ROS otherwise negative.  HOME MEDS: Current Outpatient Medications  Medication Sig Dispense Refill  . ACCU-CHEK SMARTVIEW test strip USE 1 STRIP TO CHECK GLUCOSE ONCE DAILY 100 each 0  . albuterol (VENTOLIN HFA) 108 (90 Base) MCG/ACT inhaler Inhale 2 puffs into the lungs every 6 (six) hours as needed for wheezing or shortness of breath. Needs appointment. 6.7 g 3  . amLODipine (NORVASC) 5 MG tablet TAKE 1 TABLET BY MOUTH  DAILY 90 tablet 3  . aspirin EC 81 MG tablet Take 81 mg by mouth daily.     . blood glucose meter kit and supplies KIT Dispense based on patient and insurance preference. Use up to four times daily as directed. (FOR ICD-9 250.00, 250.01). 1 each 0  . budesonide-formoterol (SYMBICORT) 80-4.5 MCG/ACT inhaler Inhale 2 puffs into the lungs daily. 1 Inhaler  6  . Coenzyme Q10 (COQ10) 50 MG CAPS Take 50 mg by mouth daily.     . Evolocumab with Infusor ([place]) 420 MG/3.5ML SOCT Inject 420 mg into the skin every 30 (thirty) days. 3 mL 3  . ezetimibe (ZETIA) 10 MG tablet TAKE 1 TABLET BY MOUTH  DAILY 90 tablet 0  . fluticasone (FLONASE) 50 MCG/ACT nasal spray Use 2 spray(s) in each nostril once daily 16 g 0  . metFORMIN (GLUCOPHAGE) 500 MG tablet TAKE 1 TABLET BY MOUTH TWICE DAILY . APPOINTMENT REQUIRED FOR FUTURE REFILLS 180 tablet 0  . rosuvastatin (CRESTOR) 10 MG tablet TAKE 1 TABLET BY MOUTH 3  TIMES WEEKLY 39 tablet 3   No current facility-administered medications for this visit.    LABS/IMAGING: No results found for this or any previous visit (from the past 48 hour(s)). No results found.  WEIGHTS: Wt Readings from Last 3 Encounters:  05/21/20 115 lb 3.2 oz (52.3 kg)  02/05/20 115 lb 3.2 oz (52.3 kg)  08/28/19 109 lb 6.4 oz (49.6 kg)    VITALS: BP (!) 147/69   Pulse 78   Ht $R'5\' 2"'hM$  (1.575 m)   Wt 115 lb 3.2 oz (52.3 kg)   SpO2 100%   BMI 21.07 kg/m   EXAM: General appearance: alert and no distress Neck: no carotid bruit, no JVD and thyroid not enlarged, symmetric, no tenderness/mass/nodules Lungs: clear to auscultation bilaterally Heart: regular rate and rhythm Abdomen: soft, non-tender; bowel sounds normal; no masses,  no organomegaly Extremities: extremities normal, atraumatic, no cyanosis or edema Pulses: 2+ and symmetric Skin: Skin color, texture, turgor normal. No rashes or lesions Neurologic: Grossly normal Psych: Pleasant  EKG: Deferred  ASSESSMENT: 1. 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 Statin intolerance - ok for rousuvastatin 10 mg TIW 3. Bilateral carotid bruits- mild bilateral carotid stenosis, left subclavian stenosis (2020) 4. Claudication concerning for PAD - moderate, stable (2020) 5. Type 2 diabetes 6. Hypertension 7. LBBB 8. Systolic murmur  PLAN: 1.   Mrs. Schirmer has been noncompliant with her once monthly Repatha Pushtronex.  We will restart that.  She will need repeat carotid, subclavian and lower extremity arterial Dopplers in July 2022.  She denies any claudication.  Blood pressure is a little elevated today but better at home.  Continue her current medications.  Repeat labs in 3 months to reassess her cholesterol.  Plan follow-up with me in 6 months or sooner as necessary.  Pixie Casino, MD, [facility], [place] Director of the [facility]  [facility] Diplomate of the American Board of Clinical Lipidology Attending Cardiologist  Direct Dial: [phone]  Fax: [phone]  Website:  [email or web address] 05/21/2020, 8:37 AM

## 2020-06-07 ENCOUNTER — Other Ambulatory Visit: Payer: Self-pay | Admitting: Internal Medicine

## 2020-06-12 ENCOUNTER — Encounter (HOSPITAL_COMMUNITY): Payer: Medicare Other

## 2020-08-04 ENCOUNTER — Other Ambulatory Visit: Payer: Self-pay | Admitting: Family

## 2020-10-13 ENCOUNTER — Other Ambulatory Visit (HOSPITAL_COMMUNITY): Payer: Self-pay | Admitting: Internal Medicine

## 2020-10-13 DIAGNOSIS — I739 Peripheral vascular disease, unspecified: Secondary | ICD-10-CM

## 2020-10-23 ENCOUNTER — Other Ambulatory Visit: Payer: Self-pay | Admitting: Family

## 2020-10-26 DIAGNOSIS — R051 Acute cough: Secondary | ICD-10-CM | POA: Diagnosis not present

## 2020-10-26 DIAGNOSIS — U071 COVID-19: Secondary | ICD-10-CM | POA: Diagnosis not present

## 2020-10-27 ENCOUNTER — Telehealth: Payer: Self-pay

## 2020-10-27 NOTE — Telephone Encounter (Addendum)
I have called pt and spoke to her daughter Rex Kras. Mom was tested positive for covid  and her sx started on Friday. Pt was seen at the urgent care and got started on the Plaxovid. Pt is feeling better.

## 2020-10-27 NOTE — Telephone Encounter (Signed)
Patient Name: Diane Richmond Gender: Female DOB: Jun 08, 1945 Client [facility] Night - Client Client Site [facility] - Night Physician [facility] - PHYSICIAN, NOT LISTED- MD Contact Type Call Who Is Calling Patient / Member / Family / Caregiver Call Type Triage / Clinical Caller Name Rena Siler Relationship To Patient Daughter Return Phone Number [phone] (Primary) Chief Complaint Hoarseness (non urgent symptom) Reason for Call Symptomatic / Request for Health Information Initial Comment Caller states her mother had a home test come back positive for COVID. What can they give her? She has sx that seems like a sinus infection. She sounds hoarse, has congestion, a sore throat. Translation No Nurse Assessment Nurse: Alexander Mt, RN, Nicholaus Bloom Date/Time (Eastern Time): 10/26/2020 12:14:24 PM Confirm and document reason for call. If symptomatic, describe symptoms. ---Caller states her mother had a home test come back positive for COVID on saturday. She has sx that seems like a sinus infection. She sounds hoarse, has congestion, a sore throat. Fever last night but is down now. States she can eat and drink. Does the patient have any new or worsening symptoms? ---Yes Will a triage be completed? ---Yes Related visit to physician within the last 2 weeks? ---No Does the PT have any chronic conditions? (i.e. diabetes, asthma, this includes High risk factors for pregnancy, etc.) ---Yes List chronic conditions. ---diabetes, HTN Is this a behavioral health or substance abuse call? ---No Guidelines Guideline Title Affirmed Question Affirmed Notes Nurse Date/Time (Eastern Time) COVID-19 - Diagnosed or Suspected [1] Fever > 101 F (38.3 C) AND [2] age > 40 years Alexander Mt, Pauline Aus 10/26/2020 12:19:41 PM

## 2020-11-04 ENCOUNTER — Other Ambulatory Visit: Payer: Self-pay | Admitting: Family

## 2020-11-06 ENCOUNTER — Telehealth: Payer: Self-pay | Admitting: Family

## 2020-11-06 NOTE — Telephone Encounter (Signed)
Please let her know she is due for OV; we need to follow up on her diabetes; if she doesn't want to come here, I will be happy to get her set up with provider at [facility]; just let me know her preference please.

## 2020-11-06 NOTE — Telephone Encounter (Signed)
I have called pt daughter and they have scheduled an appointment for 12/04/20 @ 940 for a DM follow up. Pt would like to stat with Vernona Rieger according to [place], pts daughter.

## 2020-11-20 ENCOUNTER — Inpatient Hospital Stay (HOSPITAL_COMMUNITY): Admission: RE | Admit: 2020-11-20 | Payer: Medicare Other | Source: Ambulatory Visit

## 2020-11-24 ENCOUNTER — Other Ambulatory Visit: Payer: Self-pay | Admitting: Family

## 2020-11-24 ENCOUNTER — Other Ambulatory Visit: Payer: Self-pay | Admitting: Internal Medicine

## 2020-11-24 DIAGNOSIS — I10 Essential (primary) hypertension: Secondary | ICD-10-CM

## 2020-12-04 ENCOUNTER — Other Ambulatory Visit: Payer: Self-pay

## 2020-12-04 ENCOUNTER — Encounter: Payer: Self-pay | Admitting: Family

## 2020-12-04 ENCOUNTER — Ambulatory Visit (INDEPENDENT_AMBULATORY_CARE_PROVIDER_SITE_OTHER): Payer: Medicare Other | Admitting: Family

## 2020-12-04 VITALS — BP 148/70 | HR 103 | Temp 97.8°F | Resp 98 | Ht 61.0 in | Wt 108.8 lb

## 2020-12-04 DIAGNOSIS — E119 Type 2 diabetes mellitus without complications: Secondary | ICD-10-CM

## 2020-12-04 DIAGNOSIS — J4520 Mild intermittent asthma, uncomplicated: Secondary | ICD-10-CM

## 2020-12-04 DIAGNOSIS — I10 Essential (primary) hypertension: Secondary | ICD-10-CM | POA: Diagnosis not present

## 2020-12-04 DIAGNOSIS — L309 Dermatitis, unspecified: Secondary | ICD-10-CM

## 2020-12-04 LAB — CBC WITH DIFFERENTIAL/PLATELET
Basophils Absolute: 0.1 10*3/uL (ref 0.0–0.1)
Basophils Relative: 1.2 % (ref 0.0–3.0)
Eosinophils Absolute: 0.4 10*3/uL (ref 0.0–0.7)
Eosinophils Relative: 6.4 % — ABNORMAL HIGH (ref 0.0–5.0)
HCT: 37.9 % (ref 36.0–46.0)
Hemoglobin: 12.8 g/dL (ref 12.0–15.0)
Lymphocytes Relative: 21.3 % (ref 12.0–46.0)
Lymphs Abs: 1.3 10*3/uL (ref 0.7–4.0)
MCHC: 33.8 g/dL (ref 30.0–36.0)
MCV: 96.4 fl (ref 78.0–100.0)
Monocytes Absolute: 0.5 10*3/uL (ref 0.1–1.0)
Monocytes Relative: 8.3 % (ref 3.0–12.0)
Neutro Abs: 3.7 10*3/uL (ref 1.4–7.7)
Neutrophils Relative %: 62.8 % (ref 43.0–77.0)
Platelets: 233 10*3/uL (ref 150.0–400.0)
RBC: 3.93 Mil/uL (ref 3.87–5.11)
RDW: 13.4 % (ref 11.5–15.5)
WBC: 5.9 10*3/uL (ref 4.0–10.5)

## 2020-12-04 LAB — COMPREHENSIVE METABOLIC PANEL
ALT: 10 U/L (ref 0–35)
AST: 18 U/L (ref 0–37)
Albumin: 4.3 g/dL (ref 3.5–5.2)
Alkaline Phosphatase: 71 U/L (ref 39–117)
BUN: 17 mg/dL (ref 6–23)
CO2: 28 mEq/L (ref 19–32)
Calcium: 9.6 mg/dL (ref 8.4–10.5)
Chloride: 104 mEq/L (ref 96–112)
Creatinine, Ser: 0.76 mg/dL (ref 0.40–1.20)
GFR: 77.07 mL/min (ref >60.00)
Glucose, Bld: 93 mg/dL (ref 70–99)
Potassium: 3.8 mEq/L (ref 3.5–5.1)
Sodium: 142 mEq/L (ref 135–145)
Total Bilirubin: 0.6 mg/dL (ref 0.2–1.2)
Total Protein: 7.7 g/dL (ref 6.0–8.3)

## 2020-12-04 LAB — HEMOGLOBIN A1C: Hgb A1c MFr Bld: 6.3 % (ref 4.6–6.5)

## 2020-12-04 MED ORDER — METFORMIN HCL 500 MG PO TABS: ORAL_TABLET | ORAL | 3 refills | Status: DC

## 2020-12-04 MED ORDER — ALBUTEROL SULFATE HFA 108 (90 BASE) MCG/ACT IN AERS: 2.0000 | INHALATION_SPRAY | Freq: Four times a day (QID) | RESPIRATORY_TRACT | 0 refills | Status: DC | PRN

## 2020-12-04 MED ORDER — BUDESONIDE-FORMOTEROL FUMARATE 80-4.5 MCG/ACT IN AERO: 2.0000 | INHALATION_SPRAY | Freq: Every day | RESPIRATORY_TRACT | 3 refills | Status: DC

## 2020-12-04 MED ORDER — TRIAMCINOLONE ACETONIDE 0.1 % EX CREA: 1.0000 "application " | TOPICAL_CREAM | Freq: Two times a day (BID) | CUTANEOUS | 0 refills | Status: AC

## 2020-12-04 NOTE — Progress Notes (Signed)
Diane Richmond is a 75 y.o. female with the following history as recorded in EpicCare:  Patient Active Problem List   Diagnosis Date Noted   PAD (peripheral artery disease) ([place]) 01/29/2016   Bilateral carotid bruits 07/31/2015   Claudication ([place]) 07/31/2015   Medicare annual wellness visit, subsequent 06/11/2015   Allergic rhinitis, cause unspecified 09/01/2011   Routine general medical examination at a health care facility 09/01/2011   Other screening mammogram 09/01/2011   LBBB (left bundle branch block) 05/25/2011   Controlled type 2 diabetes mellitus without complication, without long-term current use of insulin ([place]) 03/12/2010   Familial hypercholesteremia 03/12/2010   Essential hypertension 03/12/2010   Asthma 03/12/2010    Current Outpatient Medications  Medication Sig Dispense Refill   ACCU-CHEK GUIDE test strip USE  4 TIMES DAILY 100 each 0   amLODipine (NORVASC) 5 MG tablet TAKE 1 TABLET BY MOUTH  DAILY 90 tablet 0   aspirin EC 81 MG tablet Take 81 mg by mouth daily.     blood glucose meter kit and supplies KIT Dispense based on patient and insurance preference. Use up to four times daily as directed. (FOR ICD-9 250.00, 250.01). 1 each 0   Coenzyme Q10 (COQ10) 50 MG CAPS Take 50 mg by mouth daily.      Evolocumab with Infusor ([place]) 420 MG/3.5ML SOCT Inject 420 mg into the skin every 30 (thirty) days. 3.5 mL 11   ezetimibe (ZETIA) 10 MG tablet TAKE 1 TABLET BY MOUTH  DAILY 90 tablet 3   fluticasone (FLONASE) 50 MCG/ACT nasal spray Use 2 spray(s) in each nostril once daily 16 g 0   rosuvastatin (CRESTOR) 10 MG tablet TAKE 1 TABLET BY MOUTH 3  TIMES WEEKLY 39 tablet 3   triamcinolone cream (KENALOG) 0.1 % Apply 1 application topically 2 (two) times daily. 45 g 0   albuterol (VENTOLIN HFA) 108 (90 Base) MCG/ACT inhaler Inhale 2 puffs into the lungs every 6 (six) hours as needed for wheezing or shortness of breath. Needs appointment. 18 g 0    budesonide-formoterol (SYMBICORT) 80-4.5 MCG/ACT inhaler Inhale 2 puffs into the lungs daily. 3 each 3   metFORMIN (GLUCOPHAGE) 500 MG tablet TAKE 1 TABLET BY MOUTH TWICE DAILY . 180 tablet 3   No current facility-administered medications for this visit.    Allergies: Promethazine-codeine [promethazine-codeine] and Atorvastatin  Past Medical History:  Diagnosis Date   Asthma    Bronchitis    Diabetes mellitus    type II   HX: breast cancer    Hyperlipidemia    Hypertension     Past Surgical History:  Procedure Laterality Date   BREAST LUMPECTOMY  1994   left   right port-a-cath  1994   TONSILLECTOMY      Family History  Problem Relation Age of Onset   Heart disease Mother        CAD/MI   Aneurysm Father        CVA   Stroke Sister    Hypertension Sister    Breast cancer Other    Diabetes Other    Hypertension Other    Cancer Brother    Cancer Brother     Social History   Tobacco Use   Smoking status: Never   Smokeless tobacco: Former    Quit date: 03/02/1963  Substance Use Topics   Alcohol use: No    Subjective:   6 month follow up on chronic care needs- Type 2 Diabetes/ HTN/ Asthma; does need  updated labs and updated refills; Denies any chest pain, shortness of breath, blurred vision or headache       Objective:  Vitals:   12/04/20 0942  BP: (!) 148/70  Pulse: (!) 103  Resp: (!) 98  Temp: 97.8 F (36.6 C)  TempSrc: Oral  Weight: 108 lb 12.8 oz (49.4 kg)  Height: _0  (1.549 m)    General: Well developed, well nourished, in no acute distress  Skin : Warm and dry. C/w eczema noted in right antecubetal space Head: Normocephalic and atraumatic  Eyes: Sclera and conjunctiva clear; pupils round and reactive to light; extraocular movements intact  Ears: External normal; canals clear; tympanic membranes normal  Oropharynx: Pink, supple. No suspicious lesions  Neck: Supple without thyromegaly, adenopathy  Lungs: Respirations unlabored; clear to  auscultation bilaterally without wheeze, rales, rhonchi  CVS exam: normal rate and regular rhythm.  Musculoskeletal: No deformities; no active joint inflammation  Extremities: No edema, cyanosis, clubbing; bilateral foot exam is normal;  Vessels: Symmetric bilaterally  Neurologic: Alert and oriented; speech intact; face symmetrical; moves all extremities well; CNII-XII intact without focal deficit   Assessment:  1. Controlled type 2 diabetes mellitus without complication, without long-term current use of insulin ([place])   2. Mild intermittent asthma without complication   3. Essential hypertension   4. Eczema, unspecified type     Plan:  Update labs today; refill on Metformin; Stable; refill on Symbicort and albuterol; Stable; continue same medicaiton; Rx for Triamcinoline cream apply bid to affected area; reassurance given;   This visit occurred during the SARS-CoV-2 public health emergency.  Safety protocols were in place, including screening questions prior to the visit, additional usage of staff PPE, and extensive cleaning of exam room while observing appropriate contact time as indicated for disinfecting solutions.    No follow-ups on file.  Orders Placed This Encounter  Procedures   CBC with Differential/Platelet   Comp Met (CMET)   Hemoglobin A1c   Urine Microalbumin w/creat. ratio    Standing Status:   Future    Standing Expiration Date:   12/04/2021    Requested Prescriptions   Signed Prescriptions Disp Refills   triamcinolone cream (KENALOG) 0.1 % 45 g 0    Sig: Apply 1 application topically 2 (two) times daily.   budesonide-formoterol (SYMBICORT) 80-4.5 MCG/ACT inhaler 3 each 3    Sig: Inhale 2 puffs into the lungs daily.   albuterol (VENTOLIN HFA) 108 (90 Base) MCG/ACT inhaler 18 g 0    Sig: Inhale 2 puffs into the lungs every 6 (six) hours as needed for wheezing or shortness of breath. Needs appointment.   metFORMIN (GLUCOPHAGE) 500 MG tablet 180 tablet 3    Sig:  TAKE 1 TABLET BY MOUTH TWICE DAILY .

## 2020-12-07 ENCOUNTER — Other Ambulatory Visit: Payer: Self-pay | Admitting: Family

## 2020-12-09 ENCOUNTER — Telehealth: Payer: Self-pay | Admitting: *Deleted

## 2020-12-09 NOTE — Telephone Encounter (Signed)
[facility] faxed over form stating that Ventolin inhaler is not covered by ins.   Covered alternatives are:  Albuterol HFA (Proventil HFA) Albuterol HFA (ProAir)  I no change will have to prior auth.

## 2020-12-10 ENCOUNTER — Other Ambulatory Visit: Payer: Self-pay | Admitting: Family

## 2020-12-10 MED ORDER — ALBUTEROL SULFATE HFA 108 (90 BASE) MCG/ACT IN AERS
2.0000 | INHALATION_SPRAY | Freq: Four times a day (QID) | RESPIRATORY_TRACT | 2 refills | Status: DC | PRN
Start: 2020-12-10 — End: 2021-02-10

## 2020-12-18 ENCOUNTER — Telehealth: Payer: Self-pay | Admitting: Pharmacist

## 2020-12-18 NOTE — Telephone Encounter (Signed)
[facility] called, patient requested replacement Repatha Pushtronex since hers broke.  Gave verbal authorization over the phone

## 2020-12-23 ENCOUNTER — Ambulatory Visit (HOSPITAL_BASED_OUTPATIENT_CLINIC_OR_DEPARTMENT_OTHER)
Admission: RE | Admit: 2020-12-23 | Discharge: 2020-12-23 | Disposition: A | Discharge status: Home / Self Care | Payer: Medicare Other | Source: Ambulatory Visit | Attending: Internal Medicine | Admitting: Internal Medicine

## 2020-12-23 ENCOUNTER — Other Ambulatory Visit: Payer: Self-pay

## 2020-12-23 ENCOUNTER — Ambulatory Visit (HOSPITAL_COMMUNITY)
Admission: RE | Admit: 2020-12-23 | Discharge: 2020-12-23 | Disposition: A | Discharge status: Home / Self Care | Payer: Medicare Other | Source: Ambulatory Visit | Attending: Internal Medicine | Admitting: Internal Medicine

## 2020-12-23 DIAGNOSIS — I739 Peripheral vascular disease, unspecified: Secondary | ICD-10-CM | POA: Insufficient documentation

## 2020-12-23 DIAGNOSIS — I779 Disorder of arteries and arterioles, unspecified: Secondary | ICD-10-CM

## 2021-01-13 ENCOUNTER — Encounter: Payer: Self-pay | Admitting: Internal Medicine

## 2021-01-29 ENCOUNTER — Other Ambulatory Visit: Payer: Self-pay | Admitting: Family

## 2021-01-29 DIAGNOSIS — Z1231 Encounter for screening mammogram for malignant neoplasm of breast: Secondary | ICD-10-CM

## 2021-02-01 ENCOUNTER — Other Ambulatory Visit: Payer: Self-pay | Admitting: Internal Medicine

## 2021-02-10 ENCOUNTER — Other Ambulatory Visit: Payer: Self-pay | Admitting: Family

## 2021-02-14 ENCOUNTER — Other Ambulatory Visit: Payer: Self-pay | Admitting: Family

## 2021-02-17 ENCOUNTER — Other Ambulatory Visit: Payer: Self-pay | Admitting: Family

## 2021-02-17 DIAGNOSIS — I10 Essential (primary) hypertension: Secondary | ICD-10-CM

## 2021-03-05 ENCOUNTER — Ambulatory Visit
Admission: RE | Admit: 2021-03-05 | Discharge: 2021-03-05 | Disposition: A | Discharge status: Home / Self Care | Payer: Medicare Other | Source: Ambulatory Visit | Attending: Family | Admitting: Family

## 2021-03-05 DIAGNOSIS — Z1231 Encounter for screening mammogram for malignant neoplasm of breast: Secondary | ICD-10-CM | POA: Diagnosis not present

## 2021-04-12 ENCOUNTER — Telehealth: Payer: Self-pay | Admitting: Internal Medicine

## 2021-04-12 NOTE — Telephone Encounter (Signed)
Request Reference Number: [id number]. REPATHA PUSH INJ 420/3.5 is approved through 04/24/2022. Your patient may now fill this prescription and it will be covered.

## 2021-04-12 NOTE — Telephone Encounter (Signed)
PA for repatha submitted via CMM (Key: [id number])

## 2021-04-17 ENCOUNTER — Other Ambulatory Visit: Payer: Self-pay | Admitting: Family

## 2021-05-01 ENCOUNTER — Other Ambulatory Visit: Payer: Self-pay | Admitting: Internal Medicine

## 2021-06-14 ENCOUNTER — Other Ambulatory Visit: Payer: Self-pay | Admitting: Internal Medicine

## 2021-06-18 ENCOUNTER — Encounter: Payer: Self-pay | Admitting: Family

## 2021-06-18 ENCOUNTER — Ambulatory Visit (INDEPENDENT_AMBULATORY_CARE_PROVIDER_SITE_OTHER): Payer: Medicare Other | Admitting: Family

## 2021-06-18 VITALS — BP 140/68 | HR 88 | Temp 98.3°F | Ht 62.0 in | Wt 113.0 lb

## 2021-06-18 DIAGNOSIS — E119 Type 2 diabetes mellitus without complications: Secondary | ICD-10-CM

## 2021-06-18 DIAGNOSIS — J209 Acute bronchitis, unspecified: Secondary | ICD-10-CM

## 2021-06-18 LAB — COMPREHENSIVE METABOLIC PANEL
ALT: 10 U/L (ref 0–35)
AST: 17 U/L (ref 0–37)
Albumin: 4.1 g/dL (ref 3.5–5.2)
Alkaline Phosphatase: 73 U/L (ref 39–117)
BUN: 18 mg/dL (ref 6–23)
CO2: 31 mEq/L (ref 19–32)
Calcium: 9.3 mg/dL (ref 8.4–10.5)
Chloride: 105 mEq/L (ref 96–112)
Creatinine, Ser: 0.74 mg/dL (ref 0.40–1.20)
GFR: 79.28 mL/min (ref >60.00)
Glucose, Bld: 119 mg/dL — ABNORMAL HIGH (ref 70–99)
Potassium: 3.8 mEq/L (ref 3.5–5.1)
Sodium: 140 mEq/L (ref 135–145)
Total Bilirubin: 0.5 mg/dL (ref 0.2–1.2)
Total Protein: 7.5 g/dL (ref 6.0–8.3)

## 2021-06-18 LAB — CBC WITH DIFFERENTIAL/PLATELET
Basophils Absolute: 0 10*3/uL (ref 0.0–0.1)
Basophils Relative: 0.4 % (ref 0.0–3.0)
Eosinophils Absolute: 0.3 10*3/uL (ref 0.0–0.7)
Eosinophils Relative: 4.2 % (ref 0.0–5.0)
HCT: 37.8 % (ref 36.0–46.0)
Hemoglobin: 12.4 g/dL (ref 12.0–15.0)
Lymphocytes Relative: 19.3 % (ref 12.0–46.0)
Lymphs Abs: 1.4 10*3/uL (ref 0.7–4.0)
MCHC: 32.9 g/dL (ref 30.0–36.0)
MCV: 93.3 fl (ref 78.0–100.0)
Monocytes Absolute: 0.6 10*3/uL (ref 0.1–1.0)
Monocytes Relative: 8.6 % (ref 3.0–12.0)
Neutro Abs: 5 10*3/uL (ref 1.4–7.7)
Neutrophils Relative %: 67.5 % (ref 43.0–77.0)
Platelets: 228 10*3/uL (ref 150.0–400.0)
RBC: 4.05 Mil/uL (ref 3.87–5.11)
RDW: 13 % (ref 11.5–15.5)
WBC: 7.4 10*3/uL (ref 4.0–10.5)

## 2021-06-18 LAB — HEMOGLOBIN A1C: Hgb A1c MFr Bld: 6.3 % (ref 4.6–6.5)

## 2021-06-18 MED ORDER — FEXOFENADINE HCL 180 MG PO TABS: 180.0000 mg | ORAL_TABLET | Freq: Every day | ORAL | 3 refills | Status: DC

## 2021-06-18 MED ORDER — FLUTICASONE PROPIONATE 50 MCG/ACT NA SUSP: NASAL | 3 refills | Status: AC

## 2021-06-18 MED ORDER — AZITHROMYCIN 250 MG PO TABS: ORAL_TABLET | ORAL | 0 refills | Status: DC

## 2021-06-18 NOTE — Progress Notes (Signed)
Diane Richmond is a 76 y.o. female with the following history as recorded in EpicCare:  Patient Active Problem List   Diagnosis Date Noted   PAD (peripheral artery disease) ([place]) 01/29/2016   Bilateral carotid bruits 07/31/2015   Claudication ([place]) 07/31/2015   Medicare annual wellness visit, subsequent 06/11/2015   Allergic rhinitis, cause unspecified 09/01/2011   Routine general medical examination at a health care facility 09/01/2011   Other screening mammogram 09/01/2011   LBBB (left bundle branch block) 05/25/2011   Controlled type 2 diabetes mellitus without complication, without long-term current use of insulin ([place]) 03/12/2010   Familial hypercholesteremia 03/12/2010   Essential hypertension 03/12/2010   Asthma 03/12/2010    Current Outpatient Medications  Medication Sig Dispense Refill   ACCU-CHEK GUIDE test strip USE  4 TIMES DAILY 100 each 0   albuterol (VENTOLIN HFA) 108 (90 Base) MCG/ACT inhaler USE 2 INHALATIONS BY MOUTH  EVERY 6 HOURS AS NEEDED FOR WHEEZING OR SHORTNESS OF  BREATH 48 g 1   amLODipine (NORVASC) 5 MG tablet TAKE 1 TABLET BY MOUTH  DAILY 90 tablet 3   aspirin EC 81 MG tablet Take 81 mg by mouth daily.     azithromycin (ZITHROMAX) 250 MG tablet 2 tabs po qd x 1 day; 1 tablet per day x 4 days; 6 tablet 0   blood glucose meter kit and supplies KIT Dispense based on patient and insurance preference. Use up to four times daily as directed. (FOR ICD-9 250.00, 250.01). 1 each 0   budesonide-formoterol (SYMBICORT) 80-4.5 MCG/ACT inhaler Inhale 2 puffs into the lungs daily. 3 each 3   Coenzyme Q10 (COQ10) 50 MG CAPS Take 50 mg by mouth daily.      Evolocumab with Infusor ([place]) 420 MG/3.5ML SOCT Inject 420 mg into the skin every 30 (thirty) days. 3.5 mL 11   ezetimibe (ZETIA) 10 MG tablet TAKE 1 TABLET BY MOUTH DAILY 30 tablet 11   fexofenadine (ALLEGRA ALLERGY) 180 MG tablet Take 1 tablet (180 mg total) by mouth daily. 90 tablet 3   metFORMIN  (GLUCOPHAGE) 500 MG tablet Take 1 tablet (500 mg total) by mouth 2 (two) times daily with a meal. 180 tablet 1   rosuvastatin (CRESTOR) 10 MG tablet TAKE 1 TABLET BY MOUTH 3  TIMES WEEKLY 39 tablet 3   triamcinolone cream (KENALOG) 0.1 % Apply 1 application topically 2 (two) times daily. 45 g 0   fexofenadine (ALLEGRA) 180 MG tablet Take 1 tablet (180 mg total) by mouth daily. 90 tablet 3   fluticasone (FLONASE) 50 MCG/ACT nasal spray Use 2 spray(s) in each nostril once daily 48 g 3   No current facility-administered medications for this visit.    Allergies: Promethazine-codeine [promethazine-codeine] and Atorvastatin  Past Medical History:  Diagnosis Date   Asthma    Bronchitis    Diabetes mellitus    type II   HX: breast cancer    Hyperlipidemia    Hypertension     Past Surgical History:  Procedure Laterality Date   BREAST LUMPECTOMY  1994   left   right port-a-cath  1994   TONSILLECTOMY      Family History  Problem Relation Age of Onset   Heart disease Mother        CAD/MI   Aneurysm Father        CVA   Stroke Sister    Hypertension Sister    Breast cancer Other    Diabetes Other    Hypertension  Other    Cancer Brother    Cancer Brother     Social History   Tobacco Use   Smoking status: Never   Smokeless tobacco: Former    Quit date: 03/02/1963  Substance Use Topics   Alcohol use: No    Subjective:   6 month follow up on Type 2 Diabetes; Has been able to gain 5 pounds since last OV since her last OV in August 2022;  Is concerned that her allergies have caused her to have a bad cough/ worried about possible infection; not currently taking any of her allergy medication;     Objective:  Vitals:   06/18/21 0955 06/18/21 1019  BP: (!) 158/76 140/68  Pulse: 88   Temp: 98.3 F (36.8 C)   TempSrc: Oral   SpO2: 99%   Weight: 113 lb (51.3 kg)   Height: $Remove'5\' 2"'NtFrxeb$  (1.575 m)     General: Well developed, well nourished, in no acute distress  Skin : Warm and dry.   Head: Normocephalic and atraumatic  Eyes: Sclera and conjunctiva clear; pupils round and reactive to light; extraocular movements intact  Ears: External normal; canals clear; tympanic membranes normal  Oropharynx: Pink, supple. No suspicious lesions  Neck: Supple without thyromegaly, adenopathy  Lungs: Respirations unlabored; clear to auscultation bilaterally without wheeze, rales, rhonchi  CVS exam: normal rate and regular rhythm.  Neurologic: Alert and oriented; speech intact; face symmetrical; moves all extremities well; CNII-XII intact without focal deficit  Assessment:  1. Type 2 diabetes mellitus without complication, without long-term current use of insulin ([place])   2. Acute bronchitis, unspecified organism     Plan:  Update labs today; Continue Metformin 500 mg bid as prescribed;  Suspect allergy component; refills updated on Allegra and Flonase; Rx for Z-pak #1 take as directed;   Follow up worse, no better;  This visit occurred during the SARS-CoV-2 public health emergency.  Safety protocols were in place, including screening questions prior to the visit, additional usage of staff PPE, and extensive cleaning of exam room while observing appropriate contact time as indicated for disinfecting solutions.    No follow-ups on file.  Orders Placed This Encounter  Procedures   CBC with Differential/Platelet   Comp Met (CMET)   Hemoglobin A1c    Requested Prescriptions   Signed Prescriptions Disp Refills   fluticasone (FLONASE) 50 MCG/ACT nasal spray 48 g 3    Sig: Use 2 spray(s) in each nostril once daily   fexofenadine (ALLEGRA ALLERGY) 180 MG tablet 90 tablet 3    Sig: Take 1 tablet (180 mg total) by mouth daily.   azithromycin (ZITHROMAX) 250 MG tablet 6 tablet 0    Sig: 2 tabs po qd x 1 day; 1 tablet per day x 4 days;

## 2021-07-12 ENCOUNTER — Telehealth: Payer: Self-pay | Admitting: Family

## 2021-07-12 NOTE — Telephone Encounter (Signed)
Patient's daughter is calling to ler Vernona Rieger know that the pt needs to get back on the budesonide-formoterol (SYMBICORT) 80-4.5 MCG/ACT inhaler  medication because it will help her. She would like to know if a new prescription can be sent. Please advise.  ?

## 2021-07-13 ENCOUNTER — Other Ambulatory Visit: Payer: Self-pay | Admitting: Family

## 2021-07-13 MED ORDER — BUDESONIDE-FORMOTEROL FUMARATE 80-4.5 MCG/ACT IN AERO: 2.0000 | INHALATION_SPRAY | Freq: Every day | RESPIRATORY_TRACT | 3 refills | Status: DC

## 2021-07-13 NOTE — Telephone Encounter (Signed)
Pt has been called and she stated that she just needed the refill. No additional acute flairs to report. I stated understanding and informed her that it has been sent.  ?

## 2021-07-20 ENCOUNTER — Other Ambulatory Visit: Payer: Self-pay | Admitting: Internal Medicine

## 2021-07-22 ENCOUNTER — Telehealth: Payer: Self-pay | Admitting: Family

## 2021-07-22 NOTE — Telephone Encounter (Signed)
Pt is trying to schedule her awv, but needs it to be on a Tuesday or Thursday around 11 because she wants her daughter to be present. She stated if there is anyway that can be accommodated to let her know. Please advise.  ?

## 2021-07-22 NOTE — Telephone Encounter (Signed)
Lmv to call back to schedule ?

## 2021-07-23 ENCOUNTER — Other Ambulatory Visit: Payer: Self-pay | Admitting: Family

## 2021-07-26 ENCOUNTER — Other Ambulatory Visit: Payer: Self-pay

## 2021-07-26 MED ORDER — ACCU-CHEK GUIDE VI STRP: ORAL_STRIP | 0 refills | Status: DC

## 2021-07-27 ENCOUNTER — Ambulatory Visit: Payer: Medicare Other

## 2021-07-28 NOTE — Telephone Encounter (Signed)
Lvm to schedule awv

## 2021-08-29 ENCOUNTER — Other Ambulatory Visit: Payer: Self-pay | Admitting: Family

## 2021-09-17 ENCOUNTER — Other Ambulatory Visit: Payer: Self-pay | Admitting: Internal Medicine

## 2021-10-07 ENCOUNTER — Other Ambulatory Visit: Payer: Self-pay | Admitting: Internal Medicine

## 2021-10-12 ENCOUNTER — Telehealth: Payer: Self-pay

## 2021-10-12 NOTE — Telephone Encounter (Signed)
Error

## 2021-10-21 ENCOUNTER — Telehealth: Payer: Self-pay | Admitting: Family

## 2021-10-21 NOTE — Telephone Encounter (Signed)
Left message for patient to call back and schedule Medicare Annual Wellness Visit (AWV).   Please offer to do virtually or by telephone.  Left office number and my jabber [phone].  Last AWV:08/18/2016  Please schedule at anytime with Nurse Health Advisor.

## 2021-10-27 ENCOUNTER — Telehealth: Payer: Self-pay | Admitting: Internal Medicine

## 2021-10-27 NOTE — Telephone Encounter (Signed)
Called pharmacy back. Gave verbal order needed for Repatha, they will get this sent out for her.   Will make primary RN aware.  Thanks!

## 2021-10-27 NOTE — Telephone Encounter (Signed)
Pt c/o medication issue:  1. Name of Medication: Evolocumab with Infusor (REPATHA PUSHTRONEX SYSTEM) 420 MG/3.5ML SOCT  2. How are you currently taking this medication (dosage and times per day)? Not answered   3. Are you having a reaction (difficulty breathing--STAT)? No   4. What is your medication issue? Herbert Seta is calling stating this medication was damaged, so the manufacture is replacing it for free. They are requesting a callback to receive a verbal order in order to do so.

## 2021-11-10 ENCOUNTER — Other Ambulatory Visit: Payer: Self-pay | Admitting: Family

## 2021-11-10 DIAGNOSIS — I10 Essential (primary) hypertension: Secondary | ICD-10-CM

## 2021-11-23 ENCOUNTER — Telehealth: Payer: Self-pay | Admitting: Family

## 2021-11-23 ENCOUNTER — Other Ambulatory Visit: Payer: Self-pay | Admitting: Family

## 2021-11-23 NOTE — Telephone Encounter (Signed)
Please remind her she is due for 6 month follow up on her Type 2 Diabetes;

## 2021-11-23 NOTE — Telephone Encounter (Signed)
Left vm to return call.    

## 2021-11-24 NOTE — Telephone Encounter (Signed)
Spoke with pt, she will check with her daughter (her daughter drives her) and will call to make an appointment.

## 2021-12-02 ENCOUNTER — Other Ambulatory Visit: Payer: Self-pay | Admitting: Internal Medicine

## 2021-12-07 ENCOUNTER — Telehealth: Payer: Self-pay | Admitting: Family

## 2021-12-07 NOTE — Telephone Encounter (Signed)
Left message for patient's daughter to call back and schedule Medicare Annual Wellness Visit (AWV).   Please offer to do virtually or by telephone.  Left office number and my jabber [phone].  Last AWV:08/18/2016  Please schedule at anytime with Nurse Health Advisor.

## 2021-12-11 ENCOUNTER — Other Ambulatory Visit: Payer: Self-pay | Admitting: Family

## 2021-12-31 ENCOUNTER — Ambulatory Visit (INDEPENDENT_AMBULATORY_CARE_PROVIDER_SITE_OTHER): Payer: Medicare Other | Admitting: Family

## 2021-12-31 ENCOUNTER — Encounter: Payer: Self-pay | Admitting: Family

## 2021-12-31 VITALS — BP 138/60 | HR 80 | Temp 98.1°F | Ht 62.0 in | Wt 107.4 lb

## 2021-12-31 DIAGNOSIS — E119 Type 2 diabetes mellitus without complications: Secondary | ICD-10-CM

## 2021-12-31 DIAGNOSIS — J4520 Mild intermittent asthma, uncomplicated: Secondary | ICD-10-CM

## 2021-12-31 DIAGNOSIS — I10 Essential (primary) hypertension: Secondary | ICD-10-CM

## 2021-12-31 DIAGNOSIS — H6123 Impacted cerumen, bilateral: Secondary | ICD-10-CM

## 2021-12-31 NOTE — Progress Notes (Signed)
Diane Richmond is a 76 y.o. female with the following history as recorded in EpicCare:  Patient Active Problem List   Diagnosis Date Noted   PAD (peripheral artery disease) ([place]) 01/29/2016   Bilateral carotid bruits 07/31/2015   Claudication ([place]) 07/31/2015   Medicare annual wellness visit, subsequent 06/11/2015   Allergic rhinitis, cause unspecified 09/01/2011   Routine general medical examination at a health care facility 09/01/2011   Other screening mammogram 09/01/2011   LBBB (left bundle branch block) 05/25/2011   Controlled type 2 diabetes mellitus without complication, without long-term current use of insulin ([place]) 03/12/2010   Familial hypercholesteremia 03/12/2010   Essential hypertension 03/12/2010   Asthma 03/12/2010    Current Outpatient Medications  Medication Sig Dispense Refill   albuterol (VENTOLIN HFA) 108 (90 Base) MCG/ACT inhaler USE 2 INHALATIONS BY MOUTH  EVERY 6 HOURS AS NEEDED FOR WHEEZING OR SHORTNESS OF  BREATH 48 g 1   amLODipine (NORVASC) 5 MG tablet TAKE 1 TABLET BY MOUTH DAILY 90 tablet 2   aspirin EC 81 MG tablet Take 81 mg by mouth daily.     blood glucose meter kit and supplies KIT Dispense based on patient and insurance preference. Use up to four times daily as directed. (FOR ICD-9 250.00, 250.01). 1 each 0   budesonide-formoterol (SYMBICORT) 80-4.5 MCG/ACT inhaler Inhale 2 puffs into the lungs daily. 3 each 3   Coenzyme Q10 (COQ10) 50 MG CAPS Take 50 mg by mouth daily.      Evolocumab with Infusor ([place]) 420 MG/3.5ML SOCT INJECT 420 MG INTO THE SKIN EVERY 30 DAYS. PATIENT NEEDS APPOINTMENT FOR REFILLS. 3.5 mL 0   ezetimibe (ZETIA) 10 MG tablet TAKE 1 TABLET BY MOUTH DAILY 30 tablet 11   fexofenadine (ALLEGRA) 180 MG tablet Take 1 tablet (180 mg total) by mouth daily. 90 tablet 3   fluticasone (FLONASE) 50 MCG/ACT nasal spray Use 2 spray(s) in each nostril once daily 48 g 3   glucose blood (ACCU-CHEK GUIDE) test strip USE 1 TEST  STRIP UP TO 4 TIMES DAILY 100 each 12   metFORMIN (GLUCOPHAGE) 500 MG tablet TAKE 1 TABLET BY MOUTH TWICE DAILY WITH A MEAL 180 tablet 0   rosuvastatin (CRESTOR) 10 MG tablet TAKE 1 TABLET BY MOUTH 3  TIMES WEEKLY 35 tablet 3   triamcinolone cream (KENALOG) 0.1 % Apply 1 application topically 2 (two) times daily. 45 g 0   No current facility-administered medications for this visit.    Allergies: Promethazine-codeine [promethazine-codeine] and Atorvastatin  Past Medical History:  Diagnosis Date   Asthma    Bronchitis    Diabetes mellitus    type II   HX: breast cancer    Hyperlipidemia    Hypertension     Past Surgical History:  Procedure Laterality Date   BREAST LUMPECTOMY  1994   left   right port-a-cath  1994   TONSILLECTOMY      Family History  Problem Relation Age of Onset   Heart disease Mother        CAD/MI   Aneurysm Father        CVA   Stroke Sister    Hypertension Sister    Breast cancer Other    Diabetes Other    Hypertension Other    Cancer Brother    Cancer Brother     Social History   Tobacco Use   Smoking status: Never   Smokeless tobacco: Former    Quit date: 03/02/1963  Substance Use  Topics   Alcohol use: No    Subjective:   6 month follow up on her Type 2 Diabetes; needs labs updated;  Has been having ringing in her right ear x 1 week;  No acute concerns today; patient feels that today's weight is representative of her normal baseline weight- notes she often loses weight in the summertime- very active in her garden;  Breathing has been good- does not use her albuterol; does take her Symbicort daily and may need to take 2x per day occasionally;     Objective:  Vitals:   12/31/21 1306 12/31/21 1404  BP: (!) 146/80 138/60  Pulse: 80   Temp: 98.1 F (36.7 C)   TempSrc: Oral   SpO2: 99%   Weight: 107 lb 6.4 oz (48.7 kg)   Height: $Remove'5\' 2"'yDUmxvv$  (1.575 m)     General: Well developed, well nourished, in no acute distress  Skin : Warm and dry.   Head: Normocephalic and atraumatic  Eyes: Sclera and conjunctiva clear; pupils round and reactive to light; extraocular movements intact  Ears: External normal; after lavage, canals clear; tympanic membranes normal  Oropharynx: Pink, supple. No suspicious lesions  Neck: Supple without thyromegaly, adenopathy  Lungs: Respirations unlabored; clear to auscultation bilaterally without wheeze, rales, rhonchi  CVS exam: normal rate and regular rhythm.  Neurologic: Alert and oriented; speech intact; face symmetrical; moves all extremities well; CNII-XII intact without focal deficit   Assessment:  1. Controlled type 2 diabetes mellitus without complication, without long-term current use of insulin ([place])   2. Essential hypertension   3. Mild intermittent asthma without complication   4. Bilateral impacted cerumen     Plan:   Update labs today; continue same medications; Stable; continue same medications;  Stable; continue same medication; Lavage completed with no difficulty;     No follow-ups on file.  Orders Placed This Encounter  Procedures   CBC with Differential/Platelet   Comp Met (CMET)   Hemoglobin A1c   Urine Microalbumin w/creat. ratio    Requested Prescriptions    No prescriptions requested or ordered in this encounter

## 2022-01-01 LAB — COMPREHENSIVE METABOLIC PANEL
AG Ratio: 1.1 (calc) (ref 1.0–2.5)
ALT: 14 U/L (ref 6–29)
AST: 21 U/L (ref 10–35)
Albumin: 4.3 g/dL (ref 3.6–5.1)
Alkaline phosphatase (APISO): 65 U/L (ref 37–153)
BUN: 24 mg/dL (ref 7–25)
CO2: 24 mmol/L (ref 20–32)
Calcium: 9.2 mg/dL (ref 8.6–10.4)
Chloride: 105 mmol/L (ref 98–110)
Creat: 0.78 mg/dL (ref 0.60–1.00)
Globulin: 3.9 g/dL (calc) — ABNORMAL HIGH (ref 1.9–3.7)
Glucose, Bld: 75 mg/dL (ref 65–99)
Potassium: 3.9 mmol/L (ref 3.5–5.3)
Sodium: 142 mmol/L (ref 135–146)
Total Bilirubin: 0.4 mg/dL (ref 0.2–1.2)
Total Protein: 8.2 g/dL — ABNORMAL HIGH (ref 6.1–8.1)

## 2022-01-01 LAB — CBC WITH DIFFERENTIAL/PLATELET
Absolute Monocytes: 481 cells/uL (ref 200–950)
Basophils Absolute: 52 cells/uL (ref 0–200)
Basophils Relative: 0.9 %
Eosinophils Absolute: 191 cells/uL (ref 15–500)
Eosinophils Relative: 3.3 %
HCT: 40.8 % (ref 35.0–45.0)
Hemoglobin: 13.5 g/dL (ref 11.7–15.5)
Lymphs Abs: 2146 cells/uL (ref 850–3900)
MCH: 30.2 pg (ref 27.0–33.0)
MCHC: 33.1 g/dL (ref 32.0–36.0)
MCV: 91.3 fL (ref 80.0–100.0)
MPV: 11.1 fL (ref 7.5–12.5)
Monocytes Relative: 8.3 %
Neutro Abs: 2929 cells/uL (ref 1500–7800)
Neutrophils Relative %: 50.5 %
Platelets: 213 10*3/uL (ref 140–400)
RBC: 4.47 10*6/uL (ref 3.80–5.10)
RDW: 12.1 % (ref 11.0–15.0)
Total Lymphocyte: 37 %
WBC: 5.8 10*3/uL (ref 3.8–10.8)

## 2022-01-01 LAB — MICROALBUMIN / CREATININE URINE RATIO
Creatinine, Urine: 28 mg/dL (ref 20–275)
Microalb Creat Ratio: 86 mcg/mg creat — ABNORMAL HIGH (ref <30)
Microalb, Ur: 2.4 mg/dL

## 2022-01-01 LAB — HEMOGLOBIN A1C
Hgb A1c MFr Bld: 5.6 % of total Hgb (ref <5.7)
Mean Plasma Glucose: 114 mg/dL
eAG (mmol/L): 6.3 mmol/L

## 2022-01-13 ENCOUNTER — Ambulatory Visit (INDEPENDENT_AMBULATORY_CARE_PROVIDER_SITE_OTHER): Payer: Medicare Other | Admitting: *Deleted

## 2022-01-13 DIAGNOSIS — Z0000 Encounter for general adult medical examination without abnormal findings: Secondary | ICD-10-CM | POA: Diagnosis not present

## 2022-01-13 NOTE — Patient Instructions (Signed)
[name] , Thank you for taking time to come for your Medicare Wellness Visit. I appreciate your ongoing commitment to your health goals. Please review the following plan we discussed and let me know if I can assist you in the future.   These are the goals we discussed:  Goals   None     This is a list of the screening recommended for you and due dates:  Health Maintenance  Topic Date Due   Zoster (Shingles) Vaccine (1 of 2) Never done   Pneumonia Vaccine (1 - PCV) Never done   Eye exam for diabetics  01/08/2019   COVID-19 Vaccine (4 - Pfizer series) 06/23/2020   Colon Cancer Screening  07/20/2021   Tetanus Vaccine  07/20/2021   Flu Shot  11/23/2021   Complete foot exam   12/04/2021   Hemoglobin A1C  07/01/2022   Yearly kidney function blood test for diabetes  01/01/2023   Yearly kidney health urinalysis for diabetes  01/01/2023   DEXA scan (bone density measurement)  Completed   Hepatitis C Screening: USPSTF Recommendation to screen - Ages 63-79 yo.  Completed   HPV Vaccine  Aged Out      Next appointment: Follow up in one year for your annual wellness visit    Preventive Care 65 Years and Older, Female Preventive care refers to lifestyle choices and visits with your health care provider that can promote health and wellness. What does preventive care include? A yearly physical exam. This is also called an annual well check. Dental exams once or twice a year. Routine eye exams. Ask your health care provider how often you should have your eyes checked. Personal lifestyle choices, including: Daily care of your teeth and gums. Regular physical activity. Eating a healthy diet. Avoiding tobacco and drug use. Limiting alcohol use. Practicing safe sex. Taking low-dose aspirin every day. Taking vitamin and mineral supplements as recommended by your health care provider. What happens during an annual well check? The services and screenings done by your health care provider  during your annual well check will depend on your age, overall health, lifestyle risk factors, and family history of disease. Counseling  Your health care provider may ask you questions about your: Alcohol use. Tobacco use. Drug use. Emotional well-being. Home and relationship well-being. Sexual activity. Eating habits. History of falls. Memory and ability to understand (cognition). Work and work Statistician. Reproductive health. Screening  You may have the following tests or measurements: Height, weight, and BMI. Blood pressure. Lipid and cholesterol levels. These may be checked every 5 years, or more frequently if you are over 24 years old. Skin check. Lung cancer screening. You may have this screening every year starting at age 64 if you have a 30-pack-year history of smoking and currently smoke or have quit within the past 15 years. Fecal occult blood test (FOBT) of the stool. You may have this test every year starting at age 69. Flexible sigmoidoscopy or colonoscopy. You may have a sigmoidoscopy every 5 years or a colonoscopy every 10 years starting at age 85. Hepatitis C blood test. Hepatitis B blood test. Sexually transmitted disease (STD) testing. Diabetes screening. This is done by checking your blood sugar (glucose) after you have not eaten for a while (fasting). You may have this done every 1-3 years. Bone density scan. This is done to screen for osteoporosis. You may have this done starting at age 46. Mammogram. This may be done every 1-2 years. Talk to your health care  provider about how often you should have regular mammograms. Talk with your health care provider about your test results, treatment options, and if necessary, the need for more tests. Vaccines  Your health care provider may recommend certain vaccines, such as: Influenza vaccine. This is recommended every year. Tetanus, diphtheria, and acellular pertussis (Tdap, Td) vaccine. You may need a Td booster every  10 years. Zoster vaccine. You may need this after age 72. Pneumococcal 13-valent conjugate (PCV13) vaccine. One dose is recommended after age 32. Pneumococcal polysaccharide (PPSV23) vaccine. One dose is recommended after age 75. Talk to your health care provider about which screenings and vaccines you need and how often you need them. This information is not intended to replace advice given to you by your health care provider. Make sure you discuss any questions you have with your health care provider. Document Released: 05/08/2015 Document Revised: 12/30/2015 Document Reviewed: 02/10/2015 Elsevier Interactive Patient Education  2017 [place] Prevention in the Home Falls can cause injuries. They can happen to people of all ages. There are many things you can do to make your home safe and to help prevent falls. What can I do on the outside of my home? Regularly fix the edges of walkways and driveways and fix any cracks. Remove anything that might make you trip as you walk through a door, such as a raised step or threshold. Trim any bushes or trees on the path to your home. Use bright outdoor lighting. Clear any walking paths of anything that might make someone trip, such as rocks or tools. Regularly check to see if handrails are loose or broken. Make sure that both sides of any steps have handrails. Any raised decks and porches should have guardrails on the edges. Have any leaves, snow, or ice cleared regularly. Use sand or salt on walking paths during winter. Clean up any spills in your garage right away. This includes oil or grease spills. What can I do in the bathroom? Use night lights. Install grab bars by the toilet and in the tub and shower. Do not use towel bars as grab bars. Use non-skid mats or decals in the tub or shower. If you need to sit down in the shower, use a plastic, non-slip stool. Keep the floor dry. Clean up any water that spills on the floor as soon as it  happens. Remove soap buildup in the tub or shower regularly. Attach bath mats securely with double-sided non-slip rug tape. Do not have throw rugs and other things on the floor that can make you trip. What can I do in the bedroom? Use night lights. Make sure that you have a light by your bed that is easy to reach. Do not use any sheets or blankets that are too big for your bed. They should not hang down onto the floor. Have a firm chair that has side arms. You can use this for support while you get dressed. Do not have throw rugs and other things on the floor that can make you trip. What can I do in the kitchen? Clean up any spills right away. Avoid walking on wet floors. Keep items that you use a lot in easy-to-reach places. If you need to reach something above you, use a strong step stool that has a grab bar. Keep electrical cords out of the way. Do not use floor polish or wax that makes floors slippery. If you must use wax, use non-skid floor wax. Do not have throw rugs  and other things on the floor that can make you trip. What can I do with my stairs? Do not leave any items on the stairs. Make sure that there are handrails on both sides of the stairs and use them. Fix handrails that are broken or loose. Make sure that handrails are as long as the stairways. Check any carpeting to make sure that it is firmly attached to the stairs. Fix any carpet that is loose or worn. Avoid having throw rugs at the top or bottom of the stairs. If you do have throw rugs, attach them to the floor with carpet tape. Make sure that you have a light switch at the top of the stairs and the bottom of the stairs. If you do not have them, ask someone to add them for you. What else can I do to help prevent falls? Wear shoes that: Do not have high heels. Have rubber bottoms. Are comfortable and fit you well. Are closed at the toe. Do not wear sandals. If you use a stepladder: Make sure that it is fully opened.  Do not climb a closed stepladder. Make sure that both sides of the stepladder are locked into place. Ask someone to hold it for you, if possible. Clearly mark and make sure that you can see: Any grab bars or handrails. First and last steps. Where the edge of each step is. Use tools that help you move around (mobility aids) if they are needed. These include: Canes. Walkers. Scooters. Crutches. Turn on the lights when you go into a dark area. Replace any light bulbs as soon as they burn out. Set up your furniture so you have a clear path. Avoid moving your furniture around. If any of your floors are uneven, fix them. If there are any pets around you, be aware of where they are. Review your medicines with your doctor. Some medicines can make you feel dizzy. This can increase your chance of falling. Ask your doctor what other things that you can do to help prevent falls. This information is not intended to replace advice given to you by your health care provider. Make sure you discuss any questions you have with your health care provider. Document Released: 02/05/2009 Document Revised: 09/17/2015 Document Reviewed: 05/16/2014 Elsevier Interactive Patient Education  2017 [employer].

## 2022-01-13 NOTE — Progress Notes (Signed)
Subjective:   Diane Richmond is a 76 y.o. female who presents for Medicare Annual (Subsequent) preventive examination.  I connected with  Diane Richmond on 01/13/22 by a audio enabled telemedicine application and verified that I am speaking with the correct person using two identifiers.  Patient Location: Home  Provider Location: Office/Clinic  I discussed the limitations of evaluation and management by telemedicine. The patient expressed understanding and agreed to proceed.   Review of Systems    Defer to PCP Cardiac Risk Factors include: advanced age (>27mn, >>2women);diabetes mellitus;dyslipidemia;hypertension     Objective:    There were no vitals filed for this visit. There is no height or weight on file to calculate BMI.     01/13/2022    2:24 PM 02/23/2019    6:53 AM  Advanced Directives  Does Patient Have a Medical Advance Directive? Yes No  Type of [place]ive [place]  Does patient want to make changes to medical advance directive? No - Patient declined   Copy of [name] Chart? No - copy requested   Would patient like information on creating a medical advance directive? No - Patient declined     Current Medications (verified) Outpatient Encounter Medications as of 01/13/2022  Medication Sig   albuterol (VENTOLIN HFA) 108 (90 Base) MCG/ACT inhaler USE 2 INHALATIONS BY MOUTH  EVERY 6 HOURS AS NEEDED FOR WHEEZING OR SHORTNESS OF  BREATH   amLODipine (NORVASC) 5 MG tablet TAKE 1 TABLET BY MOUTH DAILY   aspirin EC 81 MG tablet Take 81 mg by mouth daily.   blood glucose meter kit and supplies KIT Dispense based on patient and insurance preference. Use up to four times daily as directed. (FOR ICD-9 250.00, 250.01).   budesonide-formoterol (SYMBICORT) 80-4.5 MCG/ACT inhaler Inhale 2 puffs into the lungs daily.   Coenzyme Q10 (COQ10) 50 MG CAPS Take 50 mg by mouth daily.    Evolocumab with Infusor ([name] 420 MG/3.5ML SOCT INJECT 420 MG INTO THE SKIN EVERY 30 DAYS. PATIENT NEEDS APPOINTMENT FOR REFILLS.   ezetimibe (ZETIA) 10 MG tablet TAKE 1 TABLET BY MOUTH DAILY   fexofenadine (ALLEGRA) 180 MG tablet Take 1 tablet (180 mg total) by mouth daily.   fluticasone (FLONASE) 50 MCG/ACT nasal spray Use 2 spray(s) in each nostril once daily   glucose blood (ACCU-CHEK GUIDE) test strip USE 1 TEST STRIP UP TO 4 TIMES DAILY   metFORMIN (GLUCOPHAGE) 500 MG tablet TAKE 1 TABLET BY MOUTH TWICE DAILY WITH A MEAL   rosuvastatin (CRESTOR) 10 MG tablet TAKE 1 TABLET BY MOUTH 3  TIMES WEEKLY   triamcinolone cream (KENALOG) 0.1 % Apply 1 application topically 2 (two) times daily.   No facility-administered encounter medications on file as of 01/13/2022.    Allergies (verified) Promethazine-codeine [promethazine-codeine] and Atorvastatin   History: Past Medical History:  Diagnosis Date   Asthma    Bronchitis    Diabetes mellitus    type II   HX: breast cancer    Hyperlipidemia    Hypertension    Past Surgical History:  Procedure Laterality Date   BREAST LUMPECTOMY  1994   left   right port-a-cath  1994   TONSILLECTOMY     Family History  Problem Relation Age of Onset   Heart disease Mother        CAD/MI   Aneurysm Father        CVA   Stroke Sister  Hypertension Sister    Breast cancer Other    Diabetes Other    Hypertension Other    Cancer Brother    Cancer Brother    Social History   Socioeconomic History   Marital status: Married    Spouse name: Not on file   Number of children: 4   Years of education: 8   Highest education level: Not on file  Occupational History   Occupation: Chief Executive Officer - retired  Tobacco Use   Smoking status: Never   Smokeless tobacco: Former    Quit date: 03/02/1963  Vaping Use   Vaping Use: Never used  Substance and Sexual Activity   Alcohol use: No   Drug use: No   Sexual activity: Not Currently  Other Topics Concern   Not on file   Social History Narrative   8th grade. Married '67 - .  4 dtrs - '67, '70, '72, '78. Grandchildren - 4. 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 Lives with husband.    Denies abuse and feels safe at home.    Social Determinants of Health   Financial Resource Strain: Low Risk  (01/13/2022)   Overall Financial Resource Strain (CARDIA)    Difficulty of Paying Living Expenses: Not hard at all  Food Insecurity: No Food Insecurity (01/13/2022)   Hunger Vital Sign    Worried About Running Out of Food in the Last Year: Never true    Ran Out of Food in the Last Year: Never true  Transportation Needs: No Transportation Needs (01/13/2022)   PRAPARE - Hydrologist (Medical): No    Lack of Transportation (Non-Medical): No  Physical Activity: Inactive (01/13/2022)   Exercise Vital Sign    Days of Exercise per Week: 0 days    Minutes of Exercise per Session: 0 min  Stress: No Stress Concern Present (01/13/2022)   [place]    Feeling of Stress : Not at all  Social Connections: Moderately Isolated (01/13/2022)   Social Connection and Isolation Panel [NHANES]    Frequency of Communication with Friends and Family: More than three times a week    Frequency of Social Gatherings with Friends and Family: More than three times a week    Attends Religious Services: Never    Marine scientist or Organizations: No    Attends Music therapist: Never    Marital Status: Married    Tobacco Counseling Counseling given: Not Answered   Clinical Intake:  Pre-visit preparation completed: Yes  Pain : No/denies pain     Diabetes: Yes CBG done?: No Did pt. bring in CBG monitor from home?: No (audio visit)  How often do you need to have someone help you when you read instructions, pamphlets, or other written materials from your doctor or pharmacy?: 1 - Never  Diabetic? Yes Nutrition Risk Assessment:  Has the  patient had any N/V/D within the last 2 months?  No  Does the patient have any non-healing wounds?  No  Has the patient had any unintentional weight loss or weight gain?  No   Diabetes:  Is the patient diabetic?  Yes  If diabetic, was a CBG obtained today?  No  Did the patient bring in their glucometer from home?   Audio visit How often do you monitor your CBG's? daily.   Financial Strains and Diabetes Management:  Are you having any financial strains with the device, your supplies or your  medication? No .  Does the patient want to be seen by Chronic Care Management for management of their diabetes?  No  Would the patient like to be referred to a Nutritionist or for Diabetic Management?  No   Diabetic Exams:  Diabetic Eye Exam: Overdue for diabetic eye exam. Pt has been advised about the importance in completing this exam. Patient advised to call and schedule an eye exam. Diabetic Foot Exam: Overdue, Pt has been advised about the importance in completing this exam. Pt is scheduled for diabetic foot exam on N/a.   Interpreter Needed?: No  Information entered by :: Beatris Ship, [place]   Activities of Daily Living    01/13/2022    2:24 PM  In your present state of health, do you have any difficulty performing the following activities:  Hearing? 0  Vision? 0  Difficulty concentrating or making decisions? 0  Walking or climbing stairs? 0  Dressing or bathing? 0  Doing errands, shopping? 0  Preparing Food and eating ? N  Using the Toilet? N  In the past six months, have you accidently leaked urine? N  Do you have problems with loss of bowel control? N  Managing your Medications? N  Managing your Finances? N  Housekeeping or managing your Housekeeping? N    Patient Care Team: Marrian Salvage, FNP as PCP - General (Internal Medicine)  Indicate any recent Medical Services you may have received from other than [facility] providers in the past year (date may be approximate).      Assessment:   This is a routine wellness examination for Gertude.  Hearing/Vision screen No results found.  Dietary issues and exercise activities discussed: Current Exercise Habits: Home exercise routine (works out in the yard), Type of exercise: Other - see comments (works out in her yard), Time (Minutes): 60, Frequency (Times/Week): 1, Weekly Exercise (Minutes/Week): 60, Intensity: Mild, Exercise limited by: None identified   Goals Addressed   None    Depression Screen    01/13/2022    2:21 PM 12/31/2021    1:08 PM 06/18/2021   10:20 AM 02/05/2020   10:45 AM 12/19/2018   11:30 AM 06/21/2017    1:10 PM 10/17/2013   10:43 AM  PHQ 2/9 Scores  PHQ - 2 Score 0 0 0 0 0 0 1    Fall Risk    01/13/2022    2:21 PM 12/31/2021    1:08 PM 06/18/2021    9:57 AM 02/05/2020   10:45 AM 08/28/2019   12:56 PM  [place] in the past year? 0 0 0 0 0  Number falls in past yr: 0 0 0 0 0  Injury with Fall? 0 0 0 0 0  Risk for fall due to : No Fall Risks No Fall Risks No Fall Risks  No Fall Risks  Follow up _0     FALL RISK PREVENTION PERTAINING TO THE HOME:  Any stairs in or around the home? Yes  If so, are there any without handrails? No  Home free of loose throw rugs in walkways, pet beds, electrical cords, etc? Yes  Adequate lighting in your home to reduce risk of falls? Yes   ASSISTIVE DEVICES UTILIZED TO PREVENT FALLS:  Life alert? No  Use of a cane, walker or w/c? No  Grab bars in the bathroom? No  Shower chair or bench in shower? No  Elevated  toilet seat or a handicapped toilet? No   TIMED UP AND GO:  Was the test performed? No . Audio visit  Cognitive Function:        01/13/2022    2:29 PM  6CIT Screen  What Year? 0 points  What month? 3 points  What time? 3 points  Count back from 20 2 points  Months in reverse 4 points  Repeat  phrase 10 points  Total Score 22 points    Immunizations Immunization History  Administered Date(s) Administered   PFIZER(Purple Top)SARS-COV-2 Vaccination 07/05/2019, 07/31/2019, 04/28/2020    TDAP status: Due, Education has been provided regarding the importance of this vaccine. Advised may receive this vaccine at local pharmacy or Health Dept. Aware to provide a copy of the vaccination record if obtained from local pharmacy or Health Dept. Verbalized acceptance and understanding.  Flu Vaccine status: Due, Education has been provided regarding the importance of this vaccine. Advised may receive this vaccine at local pharmacy or Health Dept. Aware to provide a copy of the vaccination record if obtained from local pharmacy or Health Dept. Verbalized acceptance and understanding.  Pneumococcal vaccine status: Due, Education has been provided regarding the importance of this vaccine. Advised may receive this vaccine at local pharmacy or Health Dept. Aware to provide a copy of the vaccination record if obtained from local pharmacy or Health Dept. Verbalized acceptance and understanding.  Covid-19 vaccine status: Information provided on how to obtain vaccines.   Qualifies for Shingles Vaccine? Yes   Zostavax completed No   Shingrix Completed?: No.    Education has been provided regarding the importance of this vaccine. Patient has been advised to call insurance company to determine out of pocket expense if they have not yet received this vaccine. Advised may also receive vaccine at local pharmacy or Health Dept. Verbalized acceptance and understanding.  Screening Tests Health Maintenance  Topic Date Due   Zoster Vaccines- Shingrix (1 of 2) Never done   Pneumonia Vaccine 48+ Years old (1 - PCV) Never done   OPHTHALMOLOGY EXAM  01/08/2019   COVID-19 Vaccine (4 - Pfizer series) 06/23/2020   COLONOSCOPY (Pts 45-49yr Insurance coverage will need to be confirmed)  07/20/2021   TETANUS/TDAP   07/20/2021   INFLUENZA VACCINE  11/23/2021   FOOT EXAM  12/04/2021   HEMOGLOBIN A1C  07/01/2022   Diabetic kidney evaluation - GFR measurement  01/01/2023   Diabetic kidney evaluation - Urine ACR  01/01/2023   DEXA SCAN  Completed   Hepatitis C Screening  Completed   HPV VACCINES  Aged Out    Health Maintenance  Health Maintenance Due  Topic Date Due   Zoster Vaccines- Shingrix (1 of 2) Never done   Pneumonia Vaccine 76 Years old (1 - PCV) Never done   OPHTHALMOLOGY EXAM  01/08/2019   COVID-19 Vaccine (4 - Pfizer series) 06/23/2020   COLONOSCOPY (Pts 45-456yrInsurance coverage will need to be confirmed)  07/20/2021   TETANUS/TDAP  07/20/2021   INFLUENZA VACCINE  11/23/2021   FOOT EXAM  12/04/2021    Colorectal cancer screening: Type of screening: Colonoscopy. Completed 07/21/11. Repeat every 10 years  Mammogram status: No longer required due to age.  Bone Density status: Completed 02/27/19. Results reflect: Bone density results: NORMAL. Repeat every 2 years.  Lung Cancer Screening: (Low Dose CT Chest recommended if Age 76-80ears, 30 pack-year currently smoking OR have quit w/in 15years.) does not qualify.   Lung Cancer Screening Referral: N/a  Additional Screening:  Hepatitis C Screening: does qualify; Completed 09/06/16  Vision Screening: Recommended annual ophthalmology exams for early detection of glaucoma and other disorders of the eye. Is the patient up to date with their annual eye exam?  No  Who is the provider or what is the name of the office in which the patient attends annual eye exams? Doesn't know new doctor's name If pt is not established with a provider, would they like to be referred to a provider to establish care? No .   Dental Screening: Recommended annual dental exams for proper oral hygiene  Community Resource Referral / Chronic Care Management: CRR required this visit?  No   CCM required this visit?  No      Plan:     I have personally  reviewed and noted the following in the patient's chart:   Medical and social history Use of alcohol, tobacco or illicit drugs  Current medications and supplements including opioid prescriptions. Patient is not currently taking opioid prescriptions. Functional ability and status Nutritional status Physical activity Advanced directives List of other physicians Hospitalizations, surgeries, and ER visits in previous 12 months Vitals Screenings to include cognitive, depression, and falls Referrals and appointments  In addition, I have reviewed and discussed with patient certain preventive protocols, quality metrics, and best practice recommendations. A written personalized care plan for preventive services as well as general preventive health recommendations were provided to patient.   Due to this being a telephonic visit, the after visit summary with patients personalized plan was offered to patient via mail or my-chart. Per request, patient was mailed a copy of AVS.  Beatris Ship, [place]   01/13/2022   Nurse Notes: None

## 2022-02-01 ENCOUNTER — Other Ambulatory Visit: Payer: Self-pay | Admitting: Family

## 2022-02-01 DIAGNOSIS — Z1231 Encounter for screening mammogram for malignant neoplasm of breast: Secondary | ICD-10-CM

## 2022-02-11 ENCOUNTER — Other Ambulatory Visit: Payer: Self-pay | Admitting: Internal Medicine

## 2022-03-03 ENCOUNTER — Other Ambulatory Visit: Payer: Self-pay | Admitting: Internal Medicine

## 2022-03-10 ENCOUNTER — Ambulatory Visit
Admission: RE | Admit: 2022-03-10 | Discharge: 2022-03-10 | Disposition: A | Discharge status: Home / Self Care | Payer: Medicare Other | Source: Ambulatory Visit | Attending: Family | Admitting: Family

## 2022-03-10 DIAGNOSIS — Z1231 Encounter for screening mammogram for malignant neoplasm of breast: Secondary | ICD-10-CM

## 2022-04-21 ENCOUNTER — Telehealth: Payer: Self-pay | Admitting: Internal Medicine

## 2022-04-21 NOTE — Telephone Encounter (Signed)
PA for repatha submitted via CMM (Key: [id number])

## 2022-04-21 NOTE — Telephone Encounter (Signed)
Request Reference Number: [id number]. REPATHA PUSH INJ 420/3.5 is approved through 04/25/2023. Your patient may now fill this prescription and it will be covered.

## 2022-05-19 ENCOUNTER — Other Ambulatory Visit: Payer: Self-pay | Admitting: Family

## 2022-05-19 DIAGNOSIS — I10 Essential (primary) hypertension: Secondary | ICD-10-CM

## 2022-07-12 ENCOUNTER — Other Ambulatory Visit: Payer: Self-pay | Admitting: Internal Medicine

## 2022-08-06 ENCOUNTER — Other Ambulatory Visit: Payer: Self-pay | Admitting: Internal Medicine

## 2022-08-08 ENCOUNTER — Other Ambulatory Visit: Payer: Self-pay | Admitting: Internal Medicine

## 2022-08-23 ENCOUNTER — Other Ambulatory Visit: Payer: Self-pay | Admitting: Internal Medicine

## 2022-09-01 ENCOUNTER — Other Ambulatory Visit: Payer: Self-pay | Admitting: Family

## 2022-09-07 ENCOUNTER — Ambulatory Visit (HOSPITAL_BASED_OUTPATIENT_CLINIC_OR_DEPARTMENT_OTHER): Payer: Medicare Other | Admitting: Internal Medicine

## 2022-09-09 ENCOUNTER — Other Ambulatory Visit: Payer: Self-pay | Admitting: Family

## 2022-11-04 ENCOUNTER — Other Ambulatory Visit: Payer: Self-pay | Admitting: Family

## 2022-11-25 ENCOUNTER — Encounter: Payer: Self-pay | Admitting: Internal Medicine

## 2022-11-25 ENCOUNTER — Ambulatory Visit: Payer: Medicare Other | Admitting: Internal Medicine

## 2022-11-25 VITALS — BP 170/75 | HR 84 | Ht 62.0 in | Wt 114.4 lb

## 2022-11-25 DIAGNOSIS — E7801 Familial hypercholesterolemia: Secondary | ICD-10-CM

## 2022-11-25 DIAGNOSIS — I739 Peripheral vascular disease, unspecified: Secondary | ICD-10-CM | POA: Diagnosis not present

## 2022-11-25 DIAGNOSIS — R011 Cardiac murmur, unspecified: Secondary | ICD-10-CM

## 2022-11-25 NOTE — Progress Notes (Unsigned)
OFFICE NOTE  Chief Complaint:  No complaints  Primary Care Physician: [name], FNP  HPI:  Diane Richmond is a 77 y.o. female who is currently referred to me by Charlann Noss, FNP, for evaluation of elevated cholesterol. I reviewed several years of cholesterol data as of 2013. Currently her total cholesterol is 302 which compares to a total cholesterol 266 3 years ago. Her triglycerides are 167, HDL 31, LDL 237, and non-HDL cholesterol of 270. She had a direct LDL over 200 several years ago. In addition she has type 2 diabetes, hypertension, asthma, and an abnormal EKG demonstrating left bundle branch block. She denies any chest pain or worsening shortness of breath but has had some worsening fatigue and decreased exercise tolerance, particularly over the last 6 months. Her family history is significant for mother who died of massive heart attack at age 39 and father who had a stroke and died at age 28. She also had a sister who died of stroke. In the past she had been on Lipitor but had significant myalgias with this and was switched to Crestor 20 mg daily. She said this also bothered her and lower dose was recommended. Blood pressure is up today at 162/86 but she reports being nervous. She's never been a smoker and denies alcohol use. Her sleepiness score was 0. She mentions that she does get pain in her legs when walking certain distances that gets better when she rests.  01/29/2016  Diane Richmond returns today for follow-up. Unfortunately she did not make routine follow-up and we were unable to contact her for several months. We did relay the results of her extensive testing by mail. She was scheduled for follow-up appointment which she did not make and a referral to my partner which she canceled. She returns today continues to have symptoms concerning for claudication. She underwent a nuclear stress test which was negative for ischemia and demonstrated normal LV function with an EF of  64%. Echocardiogram also showed an EF of 60-65%, grade 1 diastolic dysfunction, mild TR and PI with an RVSP of 31 mmHg. She had carotid Dopplers which showed mild bilateral carotid artery stenosis. Of note, she did have lower extremity arterial Dopplers which showed moderate to severe bilateral lower extremity arterial insufficiency. ABIs were 0.24 and the right and 0.4 on the left. Based on these findings I referred her to Dr. Allyson Sabal for evaluation and possible angiogram however she did not make this appointment. I also recommended additional lab work including an extended lipid profile as she has been intolerant to statins. We had to decrease her Crestor to 10 mg daily which she seems to be tolerating along with ezetimibe. She was being considered for Repatha and this will need to be reinitiated. 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  06/24/2016  Diane Richmond returns today for follow-up. She reports she does get some pain in her legs when she walks but is not lifestyle limiting at this point. She recently saw Dr. Allyson Sabal who talked about lower extremity angiogram however she declined and wanted to see how she did with medical therapy. Unfortunate she's not even taking daily aspirin. I highly encouraged that today. In addition we've been working on lowering cholesterol. She's currently only able to tolerate Crestor 10 mg alternating with 20 mg and then 10 mg 3 times a week. I would like to recheck her cholesterol to see where she is at currently. The plan is if she is not at goal LDL less  than 70 and non-HDL cholesterol less than 914, then we would need to consider adding medication such neuropathic. There is clearly a survival benefit in patients as well as a decreased risk of critical limb ischemia that was demonstrated in the FOURIER trial.  12/14/2016  Diane Richmond returns today for follow-up. We repeated her lipid profile and pleased to say she's had marked improvement. Total cholesterol has decreased  from 300 to 175 over the past year. Triglycerides are down from 167-63. HDL is increased from 31-37 and LDL-C has decreased from 237-125. She's currently on ezetimibe 10 mg daily and rosuvastatin 10 mg daily. She reports she cannot take increased doses of rosuvastatin secondary to myalgias and was completely intolerant of even low doses of atorvastatin. Although she's had marked and significant improvement in her LDL cholesterol, her goal LDL-C is less than 70 given the fact she has significant peripheral arterial disease with decreased ABIs. I discussed this with her at length today and feel that she is a good candidate for PCSK9 inhibitor. We discussed options and she felt that once monthly dosing with her path via the pump is the best option for her.  06/29/2017  Diane Richmond returns today for follow-up.  Overall she seems to be doing well.  She is noted recently that her blood pressure was elevated.  Her daughter brought her blood pressure cuff.  Fortunately she was recently started on Repatha.  She says she took her last dose in January, but there was a problem with renewing the medication and she did not have a dose in February.  Prior to that she had a marked reduction as previously described in her LDL cholesterol to 125.  This is still much higher than her target less than 70 however marks a significant reduction from her LDL cholesterol which was greater than 300.  Her most recent labs in November 2018 showed total cholesterol 143, LDL 58, triglycerides 241 and HDL 37.  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  In addition, we discussed this further today.  She does have 4 children, 3 of which have had difficulty with elevated cholesterol.  Her daughter, who accompanied her today said it is not an issue for her.  We discussed the possibility of genetic screening as she has a number of grandchildren and even a great grandchild who certainly could be at risk for genetic  dyslipidemia.  She denies any new symptoms of claudication, chest pain or worsening shortness of breath.  Blood pressure at home has been in the 140s and 150s and was noted to be 158 systolic today.  06/26/2018  Diane Richmond has done well on the Repatha Pushtronex system.  Her most recent lipid profile this month showed total cholesterol 92, triglycerides 60, HDL 39 and LDL 41.  We have achieved approval for her.  She denies any significant claudication.  She says she is active and walks fairly regularly.  She did have high-grade bilateral SFA disease as well as carotid artery disease in the past and is due for repeat Dopplers.  She denies any chest pain.  She does report some congestion and seasonal allergies but other than that no significant shortness of breath.  EKG is stable today shows sinus rhythm and left bundle branch block.  Blood pressure is at goal.  01/09/2019  Diane Richmond is seen today in follow-up.  Unfortunately, she says she has been off of her Repatha probably for the past 5 to 6 months.  Apparently there was a difficulty  affording the medication and she did not get patient assistance.  Although she had had good control of her cholesterol, suspect it is not improved at this point.  Previously her A1c was also well controlled at 6.  She does have significant peripheral arterial disease and had repeat Dopplers recently which showed mild bilateral carotid artery stenosis with left subclavian stenosis and possible steal syndrome.  She seems asymptomatic with this.  Also, she is noted to have moderate bilateral lower extremity arterial disease which is stable.  She says she walks pretty regularly and denies any claudication symptoms.  05/21/2020  Diane Richmond is seen today in follow-up.  She says she has not been on her Repatha.  In the past she had been off of it for 5 to 6 months.  For some reason this has not been continued.  She says that her daughter had not picked it up or provided it for her.  After speaking  with both of them they are interested in restarting it.  He would need to repeat Dopplers in about 6 months.  She did have some possibly significant left subclavian stenosis, mild carotid artery disease and moderate lower extremity arterial disease but had an excellent response to the Repatha.  She is due for repeat Dopplers in July.  She denies any symptoms of claudication.  11/25/2022  Diane Richmond returns today for follow-up.  Overall she says she is feeling well.  Her blood pressure was quite high today however she said she had not taken her medications.  I do not see any recent lipid testing in our system.  She did have renewal of her Repatha prior authorization, but I suspect she is noncompliant with it.  She has been on the once monthly Pushtronex system.  Unfortunately this will likely be discontinued by [employer].  She may need to switch to twice monthly Repatha.  I would like to update an NMR lipid profile to see where she is at currently.  In addition she is describing some style limiting claudication.  She has a history of moderate bilateral reduction in ABIs.  PMHx:  Past Medical History:  Diagnosis Date   Asthma    Bronchitis    Diabetes mellitus    type II   HX: breast cancer    Hyperlipidemia    Hypertension     Past Surgical History:  Procedure Laterality Date   BREAST LUMPECTOMY  1994   left   right port-a-cath  1994   TONSILLECTOMY      FAMHx:  Family History  Problem Relation Age of Onset   Heart disease Mother        CAD/MI   Aneurysm Father        CVA   Stroke Sister    Hypertension Sister    Breast cancer Other    Diabetes Other    Hypertension Other    Cancer Brother    Cancer Brother     SOCHx:   reports that she has never smoked. She quit smokeless tobacco use about 59 years ago. She reports that she does not drink alcohol and does not use drugs.  ALLERGIES:  Allergies  Allergen Reactions   Promethazine-Codeine [Promethazine-Codeine] Nausea And Vomiting     Pt states cough syrup she can't take   Atorvastatin Other (See Comments)    REACTION: legs ache    ROS: Pertinent items noted in HPI and remainder of comprehensive ROS otherwise negative.  HOME MEDS: Current Outpatient Medications  Medication Sig Dispense Refill  albuterol (VENTOLIN HFA) 108 (90 Base) MCG/ACT inhaler USE 2 INHALATIONS BY MOUTH  EVERY 6 HOURS AS NEEDED FOR WHEEZING OR SHORTNESS OF  BREATH 48 g 1   amLODipine (NORVASC) 5 MG tablet TAKE 1 TABLET BY MOUTH DAILY 100 tablet 2   aspirin EC 81 MG tablet Take 81 mg by mouth daily.     blood glucose meter kit and supplies KIT Dispense based on patient and insurance preference. Use up to four times daily as directed. (FOR ICD-9 250.00, 250.01). 1 each 0   Coenzyme Q10 (COQ10) 50 MG CAPS Take 50 mg by mouth daily.      Evolocumab with Infusor (REPATHA PUSHTRONEX SYSTEM) 420 MG/3.5ML SOCT INJECT 420 MG INTO THE SKIN EVERY 30 DAYS, PATIENT NEEDS APPOINTMENT FOR REFILLS 3.5 mL 11   ezetimibe (ZETIA) 10 MG tablet TAKE 1 TABLET BY MOUTH DAILY 60 tablet 5   fluticasone (FLONASE) 50 MCG/ACT nasal spray Use 2 spray(s) in each nostril once daily 48 g 3   glucose blood (ACCU-CHEK GUIDE) test strip USE 1 TEST STRIP UP TO 4 TIMES DAILY 100 each 12   metFORMIN (GLUCOPHAGE) 500 MG tablet TAKE 1 TABLET BY MOUTH TWICE DAILY WITH A MEAL 180 tablet 0   rosuvastatin (CRESTOR) 10 MG tablet TAKE 1 TABLET BY MOUTH 3 TIMES  WEEKLY 43 tablet 2   SYMBICORT 80-4.5 MCG/ACT inhaler INHALE 2 PUFFS BY MOUTH ONCE DAILY 11 g 0   triamcinolone cream (KENALOG) 0.1 % Apply 1 application topically 2 (two) times daily. 45 g 0   fexofenadine (ALLEGRA) 180 MG tablet Take 1 tablet (180 mg total) by mouth daily. 90 tablet 3   No current facility-administered medications for this visit.    LABS/IMAGING: No results found for this or any previous visit (from the past 48 hour(s)). No results found.  WEIGHTS: Wt Readings from Last 3 Encounters:  11/25/22 114 lb 6.4 oz  (51.9 kg)  12/31/21 107 lb 6.4 oz (48.7 kg)  06/18/21 113 lb (51.3 kg)    VITALS: BP (!) 170/75 (BP Location: Left Arm, Patient Position: Sitting, Cuff Size: Normal)   Pulse 84   Ht 5\' 2"  (1.575 m)   Wt 114 lb 6.4 oz (51.9 kg)   SpO2 99%   BMI 20.92 kg/m   EXAM: General appearance: alert and no distress Neck: no carotid bruit, no JVD and thyroid not enlarged, symmetric, no tenderness/mass/nodules Lungs: clear to auscultation bilaterally Heart: regular rate and rhythm Abdomen: soft, non-tender; bowel sounds normal; no masses,  no organomegaly Extremities: extremities normal, atraumatic, no cyanosis or edema Pulses: 2+ and symmetric Skin: Skin color, texture, turgor normal. 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   Diane Richmond is questionably compliant with her once monthly Pushtronex.  She said she may have missed 1 month however I suspect more.  Also [employer] is moving away from this product and she will likely need to transition to the twice monthly injections.  Will get an updated lipid profile higher to this transition.  Also she is describing some lifestyle limiting claudication.  She had moderately reduced bilateral ABIs in the past.  I like to update her lower extremity arterial Dopplers.  Plan follow-up with  me in about 4 months.  Chrystie Nose, MD, [facility], FACP  [place]  [facility]  Medical Director of the Advanced Lipid Disorders &  Cardiovascular Risk Reduction Clinic Diplomate of the American Board of Clinical Lipidology Attending Cardiologist  Direct Dial:  [phone]  Fax: [phone]  Website:  [email or web address] 11/25/2022, 11:13 AM

## 2022-11-25 NOTE — Patient Instructions (Signed)
Medication Instructions:  NO CHANGES today -- if you are going to remain on an injectable cholesterol medication, we may need to change you to the twice monthly Repatha   *If you need a refill on your cardiac medications before your next appointment, please call your pharmacy*   Lab Work: NMR lipoprofile   If you have labs (blood work) drawn today and your tests are completely normal, you will receive your results only by: MyChart Message (if you have MyChart) OR A paper copy in the mail If you have any lab test that is abnormal or we need to change your treatment, we will call you to review the results.   Testing/Procedures: Your physician has requested that you have an echocardiogram. Echocardiography is a painless test that uses sound waves to create images of your heart. It provides your doctor with information about the size and shape of your heart and how well your heart's chambers and valves are working. This procedure takes approximately one hour. There are no restrictions for this procedure. Please do NOT wear cologne, perfume, aftershave, or lotions (deodorant is allowed). Please arrive 15 minutes prior to your appointment time.  Your physician has ordered an ABI study to look at blood flow in your legs.    Follow-Up: At [facility], you and your health needs are our priority.  As part of our continuing mission to provide you with exceptional heart care, we have created designated Provider Care Teams.  These Care Teams include your primary Cardiologist (physician) and Advanced Practice Providers (APPs -  Physician Assistants and Nurse Practitioners) who all work together to provide you with the care you need, when you need it.  We recommend signing up for the patient portal called "MyChart".  Sign up information is provided on this After Visit Summary.  MyChart is used to connect with patients for Virtual Visits (Telemedicine).  Patients are able to view lab/test  results, encounter notes, upcoming appointments, etc.  Non-urgent messages can be sent to your provider as well.   To learn more about what you can do with MyChart, go to [email or web address].    Your next appointment:    4 months with Dr. Rennis Golden

## 2022-12-01 ENCOUNTER — Other Ambulatory Visit: Payer: Self-pay | Admitting: *Deleted

## 2022-12-01 DIAGNOSIS — E7801 Familial hypercholesterolemia: Secondary | ICD-10-CM

## 2022-12-01 MED ORDER — REPATHA SURECLICK 140 MG/ML ~~LOC~~ SOAJ: 140.0000 mg | SUBCUTANEOUS | 3 refills | Status: DC

## 2022-12-02 ENCOUNTER — Telehealth: Payer: Self-pay | Admitting: Internal Medicine

## 2022-12-02 MED ORDER — EZETIMIBE 10 MG PO TABS: 10.0000 mg | ORAL_TABLET | Freq: Every day | ORAL | 3 refills | Status: DC

## 2022-12-02 NOTE — Telephone Encounter (Signed)
Can you verify the dosage.  States twice weekly but RX states every 14 days.  Results given and directions twice weekly for Repatha. If different please advise

## 2022-12-02 NOTE — Telephone Encounter (Signed)
Pts daughter Marylene Land calling in, returning call. She says that whoever called did not leave a name, just said that they were a nurse and calling back for Dr. Rennis Golden. Please advise.

## 2022-12-02 NOTE — Telephone Encounter (Signed)
Spoke with daughter. Repatha Sureclick 140mg /mL is dosed once every 14 days, not twice weekly. Patient also needed Rx for zetia for 90 day supply to [facility]. Was last sent in for only 60 day supply.

## 2022-12-02 NOTE — Addendum Note (Signed)
Addended by: Lindell Spar on: 12/02/2022 04:22 PM   Modules accepted: Orders

## 2022-12-03 ENCOUNTER — Other Ambulatory Visit: Payer: Self-pay | Admitting: Family

## 2022-12-05 ENCOUNTER — Other Ambulatory Visit (HOSPITAL_COMMUNITY): Payer: Self-pay

## 2022-12-09 ENCOUNTER — Ambulatory Visit (HOSPITAL_COMMUNITY): Payer: Medicare Other

## 2022-12-16 ENCOUNTER — Other Ambulatory Visit (HOSPITAL_COMMUNITY): Payer: Medicare Other

## 2022-12-22 ENCOUNTER — Other Ambulatory Visit: Payer: Self-pay

## 2022-12-22 MED ORDER — ROSUVASTATIN CALCIUM 10 MG PO TABS: ORAL_TABLET | ORAL | 3 refills | Status: DC

## 2022-12-23 ENCOUNTER — Ambulatory Visit (HOSPITAL_COMMUNITY)
Admission: RE | Admit: 2022-12-23 | Discharge: 2022-12-23 | Disposition: A | Discharge status: Home / Self Care | Payer: Medicare Other | Source: Ambulatory Visit | Attending: Cardiology | Admitting: Cardiology

## 2022-12-23 DIAGNOSIS — I739 Peripheral vascular disease, unspecified: Secondary | ICD-10-CM | POA: Insufficient documentation

## 2022-12-27 LAB — VAS US ABI WITH/WO TBI
Left ABI: 0.58
Right ABI: 0.48

## 2022-12-30 ENCOUNTER — Ambulatory Visit (HOSPITAL_COMMUNITY): Payer: Medicare Other | Attending: Internal Medicine

## 2022-12-30 DIAGNOSIS — R011 Cardiac murmur, unspecified: Secondary | ICD-10-CM | POA: Insufficient documentation

## 2022-12-30 LAB — ECHOCARDIOGRAM COMPLETE
Area-P 1/2: 4.21 cm2
S' Lateral: 2.1 cm

## 2023-01-04 ENCOUNTER — Telehealth: Payer: Self-pay | Admitting: Internal Medicine

## 2023-01-04 NOTE — Telephone Encounter (Signed)
Spoke with daughter per DPR and she is aware of echo results. Verbalized understanding

## 2023-01-04 NOTE — Telephone Encounter (Signed)
Patient's daughter is returning call in regards to Echo results.

## 2023-01-26 ENCOUNTER — Other Ambulatory Visit: Payer: Self-pay | Admitting: Family

## 2023-01-26 DIAGNOSIS — Z1231 Encounter for screening mammogram for malignant neoplasm of breast: Secondary | ICD-10-CM

## 2023-01-30 ENCOUNTER — Encounter: Payer: Self-pay | Admitting: Internal Medicine

## 2023-02-06 ENCOUNTER — Other Ambulatory Visit: Payer: Self-pay | Admitting: Family

## 2023-02-17 ENCOUNTER — Telehealth: Payer: Self-pay | Admitting: Family

## 2023-02-17 NOTE — Telephone Encounter (Signed)
Copied from CRM [id number]. Topic: Medicare AWV >> Feb 17, 2023  2:10 PM Payton Doughty wrote: Reason for CRM: Called LVM 02/17/19 24 to schedule Annual Wellness Visit  Verlee Rossetti; Care Guide Ambulatory Clinical Support [place] [facility] Direct Dial: [phone]

## 2023-02-25 ENCOUNTER — Other Ambulatory Visit: Payer: Self-pay | Admitting: Family

## 2023-02-25 DIAGNOSIS — I10 Essential (primary) hypertension: Secondary | ICD-10-CM

## 2023-02-28 ENCOUNTER — Other Ambulatory Visit: Payer: Self-pay | Admitting: *Deleted

## 2023-02-28 DIAGNOSIS — E7801 Familial hypercholesterolemia: Secondary | ICD-10-CM

## 2023-03-01 ENCOUNTER — Telehealth: Payer: Self-pay | Admitting: *Deleted

## 2023-03-01 NOTE — Telephone Encounter (Signed)
LMOM for pt to return call to schedule AWVS.

## 2023-03-31 ENCOUNTER — Ambulatory Visit
Admission: RE | Admit: 2023-03-31 | Discharge: 2023-03-31 | Disposition: A | Discharge status: Home / Self Care | Payer: Medicare Other | Source: Ambulatory Visit | Attending: Family | Admitting: Family

## 2023-03-31 DIAGNOSIS — Z1231 Encounter for screening mammogram for malignant neoplasm of breast: Secondary | ICD-10-CM

## 2023-04-04 ENCOUNTER — Telehealth: Payer: Self-pay | Admitting: Family

## 2023-04-04 NOTE — Telephone Encounter (Signed)
Can you mail her a letter to remind her that she is due for OV please? Last seen here 12/2021.

## 2023-04-05 NOTE — Telephone Encounter (Signed)
Letter has been printed and placed up front to mail out to pt.

## 2023-04-06 ENCOUNTER — Ambulatory Visit: Payer: Medicare Other | Admitting: Podiatry

## 2023-04-06 ENCOUNTER — Encounter: Payer: Self-pay | Admitting: Podiatry

## 2023-04-06 DIAGNOSIS — M79675 Pain in left toe(s): Secondary | ICD-10-CM

## 2023-04-06 DIAGNOSIS — L84 Corns and callosities: Secondary | ICD-10-CM | POA: Diagnosis not present

## 2023-04-06 DIAGNOSIS — M79674 Pain in right toe(s): Secondary | ICD-10-CM

## 2023-04-06 DIAGNOSIS — E1140 Type 2 diabetes mellitus with diabetic neuropathy, unspecified: Secondary | ICD-10-CM | POA: Diagnosis not present

## 2023-04-06 DIAGNOSIS — M2042 Other hammer toe(s) (acquired), left foot: Secondary | ICD-10-CM | POA: Diagnosis not present

## 2023-04-06 DIAGNOSIS — B351 Tinea unguium: Secondary | ICD-10-CM

## 2023-04-06 DIAGNOSIS — M2041 Other hammer toe(s) (acquired), right foot: Secondary | ICD-10-CM

## 2023-04-09 NOTE — Progress Notes (Signed)
  Subjective:  Patient ID: Diane Richmond, female    DOB: 11-Dec-1945,  MRN: [id number]  Chief Complaint  Patient presents with   Foot Pain    DM II. Left foot in pain for a couple weeks. Possible corns on RT 5th toe and 4th toe on LT foot. Trim nails on both feet.     77 y.o. female presents with the above complaint. History confirmed with patient. Patient presenting with pain related to dystrophic thickened elongated nails. Patient is unable to trim own nails related to nail dystrophy and/or mobility issues. Patient does have a history of T2DM.  She is unsure of her last A1c, states morning blood glucose was 135.  Patient does have calluses present located at the bilateral fifth toes and left with toe causing pain.  She endorses subjective numbness, tingling, burning sensation to the toes.  Objective:  Physical Exam: Bilateral lower extremities warm to cool temperature distribution, capillary refill greater than 3 seconds to the digits.  Pedal hair growth absent. nail exam onychomycosis of the toenails x 10, onycholysis, dystrophic nails, subungual debris and pain with direct dorsal palpation DP pulses barely palpable bilaterally, PT pulses nonpalpable bilaterally.  Protective sensation absent and vibratory sensation diminished Left Foot:  Pain with palpation of nails due to elongation and dystrophic growth.  Listers corn present to the fifth and fourth toes associated with lateral aspects of overgrown toenail that is tender on palpation Right Foot: Pain with palpation of nails due to elongation and dystrophic growth.  Listers corn present to the left fifth toe associated with overgrown toenail that is tender on palpation Hammertoe contractures of lesser digits noted.  Gait: Ambulates with cane assistance  Assessment:   1. Type 2 diabetes mellitus with diabetic neuropathy, without long-term current use of insulin (HCC)   2. Pre-ulcerative calluses   3. Pain due to onychomycosis of toenails  of both feet   4. Hammertoes of both feet      Plan:  Patient was evaluated and treated and all questions answered.  #Hyperkeratotic lesions present bilateral fifth toes, left fourth toe associated with lateral aspect of nail All symptomatic hyperkeratoses x 3 separate lesions were safely debrided with a sterile # 15 blade to patient's level of comfort without incident. We discussed preventative and palliative care of these lesions including supportive and accommodative shoegear, padding, prefabricated and custom molded accommodative orthoses, use of a pumice stone and lotions/creams daily.  #Onychomycosis with pain  -Nails palliatively debrided as below. -Educated on self-care  Procedure: Nail Debridement Rationale: Pain Type of Debridement: manual, sharp debridement. Instrumentation: Nail nipper, rotary burr. Number of Nails: 10  # Diabetes with neuropathy, diminished pulses, hammertoes -Patient educated on diabetes. Discussed proper diabetic foot care and discussed risks and complications of disease. Educated patient in depth on reasons to return to the office immediately should he/she discover anything concerning or new on the feet. All questions answered. Discussed proper shoes as well.   -Patient would like to defer diabetic shoes.  Return in about 3 months (around 07/05/2023) for Diabetic Foot Care.         Bronwen Betters, DPM [facility]

## 2023-04-10 ENCOUNTER — Ambulatory Visit: Payer: Medicare Other | Attending: Internal Medicine | Admitting: Internal Medicine

## 2023-04-10 VITALS — BP 124/74 | HR 96 | Ht 62.0 in | Wt 117.0 lb

## 2023-04-10 DIAGNOSIS — I739 Peripheral vascular disease, unspecified: Secondary | ICD-10-CM | POA: Diagnosis not present

## 2023-04-10 DIAGNOSIS — E7801 Familial hypercholesterolemia: Secondary | ICD-10-CM | POA: Diagnosis not present

## 2023-04-10 DIAGNOSIS — R6889 Other general symptoms and signs: Secondary | ICD-10-CM

## 2023-04-10 LAB — LIPID PANEL
Chol/HDL Ratio: 2.5 {ratio} (ref 0.0–4.4)
Cholesterol, Total: 83 mg/dL — ABNORMAL LOW (ref 100–199)
HDL: 33 mg/dL — ABNORMAL LOW (ref >39)
LDL Chol Calc (NIH): 36 mg/dL (ref 0–99)
Triglycerides: 58 mg/dL (ref 0–149)
VLDL Cholesterol Cal: 14 mg/dL (ref 5–40)

## 2023-04-10 NOTE — Progress Notes (Signed)
OFFICE NOTE  Chief Complaint:  No complaints  Primary Care Physician: [name], FNP  HPI:  Diane Richmond is a 77 y.o. female who is currently referred to me by Diane Noss, FNP, for evaluation of elevated cholesterol. I reviewed several years of cholesterol data as of 2013. Currently her total cholesterol is 302 which compares to a total cholesterol 266 3 years ago. Her triglycerides are 167, HDL 31, LDL 237, and non-HDL cholesterol of 270. She had a direct LDL over 200 several years ago. In addition she has type 2 diabetes, hypertension, asthma, and an abnormal EKG demonstrating left bundle branch block. She denies any chest pain or worsening shortness of breath but has had some worsening fatigue and decreased exercise tolerance, particularly over the last 6 months. Her family history is significant for mother who died of massive heart attack at age 73 and father who had a stroke and died at age 72. She also had a sister who died of stroke. In the past she had been on Lipitor but had significant myalgias with this and was switched to Crestor 20 mg daily. She said this also bothered her and lower dose was recommended. Blood pressure is up today at 162/86 but she reports being nervous. She's never been a smoker and denies alcohol use. Her sleepiness score was 0. She mentions that she does get pain in her legs when walking certain distances that gets better when she rests.  01/29/2016  Diane Richmond returns today for follow-up. Unfortunately she did not make routine follow-up and we were unable to contact her for several months. We did relay the results of her extensive testing by mail. She was scheduled for follow-up appointment which she did not make and a referral to my partner which she canceled. She returns today continues to have symptoms concerning for claudication. She underwent a nuclear stress test which was negative for ischemia and demonstrated normal LV function with an EF of  64%. Echocardiogram also showed an EF of 60-65%, grade 1 diastolic dysfunction, mild TR and PI with an RVSP of 31 mmHg. She had carotid Dopplers which showed mild bilateral carotid artery stenosis. Of note, she did have lower extremity arterial Dopplers which showed moderate to severe bilateral lower extremity arterial insufficiency. ABIs were 0.24 and the right and 0.4 on the left. Based on these findings I referred her to Dr. Allyson Sabal for evaluation and possible angiogram however she did not make this appointment. I also recommended additional lab work including an extended lipid profile as she has been intolerant to statins. We had to decrease her Crestor to 10 mg daily which she seems to be tolerating along with ezetimibe. She was being considered for Repatha and this will need to be reinitiated. 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  06/24/2016  Diane Richmond returns today for follow-up. She reports she does get some pain in her legs when she walks but is not lifestyle limiting at this point. She recently saw Dr. Allyson Sabal who talked about lower extremity angiogram however she declined and wanted to see how she did with medical therapy. Unfortunate she's not even taking daily aspirin. I highly encouraged that today. In addition we've been working on lowering cholesterol. She's currently only able to tolerate Crestor 10 mg alternating with 20 mg and then 10 mg 3 times a week. I would like to recheck her cholesterol to see where she is at currently. The plan is if she is not at goal LDL less  than 70 and non-HDL cholesterol less than 161, then we would need to consider adding medication such neuropathic. There is clearly a survival benefit in patients as well as a decreased risk of critical limb ischemia that was demonstrated in the FOURIER trial.  12/14/2016  Diane Richmond returns today for follow-up. We repeated her lipid profile and pleased to say she's had marked improvement. Total cholesterol has decreased  from 300 to 175 over the past year. Triglycerides are down from 167-63. HDL is increased from 31-37 and LDL-C has decreased from 237-125. She's currently on ezetimibe 10 mg daily and rosuvastatin 10 mg daily. She reports she cannot take increased doses of rosuvastatin secondary to myalgias and was completely intolerant of even low doses of atorvastatin. Although she's had marked and significant improvement in her LDL cholesterol, her goal LDL-C is less than 70 given the fact she has significant peripheral arterial disease with decreased ABIs. I discussed this with her at length today and feel that she is a good candidate for PCSK9 inhibitor. We discussed options and she felt that once monthly dosing with her path via the pump is the best option for her.  06/29/2017  Diane Richmond returns today for follow-up.  Overall she seems to be doing well.  She is noted recently that her blood pressure was elevated.  Her daughter brought her blood pressure cuff.  Fortunately she was recently started on Repatha.  She says she took her last dose in January, but there was a problem with renewing the medication and she did not have a dose in February.  Prior to that she had a marked reduction as previously described in her LDL cholesterol to 125.  This is still much higher than her target less than 70 however marks a significant reduction from her LDL cholesterol which was greater than 300.  Her most recent labs in November 2018 showed total cholesterol 143, LDL 58, triglycerides 241 and HDL 37.  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  In addition, we discussed this further today.  She does have 4 children, 3 of which have had difficulty with elevated cholesterol.  Her daughter, who accompanied her today said it is not an issue for her.  We discussed the possibility of genetic screening as she has a number of grandchildren and even a great grandchild who certainly could be at risk for genetic  dyslipidemia.  She denies any new symptoms of claudication, chest pain or worsening shortness of breath.  Blood pressure at home has been in the 140s and 150s and was noted to be 158 systolic today.  06/26/2018  Diane Richmond has done well on the Repatha Pushtronex system.  Her most recent lipid profile this month showed total cholesterol 92, triglycerides 60, HDL 39 and LDL 41.  We have achieved approval for her.  She denies any significant claudication.  She says she is active and walks fairly regularly.  She did have high-grade bilateral SFA disease as well as carotid artery disease in the past and is due for repeat Dopplers.  She denies any chest pain.  She does report some congestion and seasonal allergies but other than that no significant shortness of breath.  EKG is stable today shows sinus rhythm and left bundle branch block.  Blood pressure is at goal.  01/09/2019  Diane Richmond is seen today in follow-up.  Unfortunately, she says she has been off of her Repatha probably for the past 5 to 6 months.  Apparently there was a difficulty  affording the medication and she did not get patient assistance.  Although she had had good control of her cholesterol, suspect it is not improved at this point.  Previously her A1c was also well controlled at 6.  She does have significant peripheral arterial disease and had repeat Dopplers recently which showed mild bilateral carotid artery stenosis with left subclavian stenosis and possible steal syndrome.  She seems asymptomatic with this.  Also, she is noted to have moderate bilateral lower extremity arterial disease which is stable.  She says she walks pretty regularly and denies any claudication symptoms.  05/21/2020  Diane Richmond is seen today in follow-up.  She says she has not been on her Repatha.  In the past she had been off of it for 5 to 6 months.  For some reason this has not been continued.  She says that her daughter had not picked it up or provided it for her.  After speaking  with both of them they are interested in restarting it.  He would need to repeat Dopplers in about 6 months.  She did have some possibly significant left subclavian stenosis, mild carotid artery disease and moderate lower extremity arterial disease but had an excellent response to the Repatha.  She is due for repeat Dopplers in July.  She denies any symptoms of claudication.  11/25/2022  Diane Richmond returns today for follow-up.  Overall she says she is feeling well.  Her blood pressure was quite high today however she said she had not taken her medications.  I do not see any recent lipid testing in our system.  She did have renewal of her Repatha prior authorization, but I suspect she is noncompliant with it.  She has been on the once monthly Pushtronex system.  Unfortunately this will likely be discontinued by [employer].  She may need to switch to twice monthly Repatha.  I would like to update an NMR lipid profile to see where she is at currently.  In addition she is describing some style limiting claudication.  She has a history of moderate bilateral reduction in ABIs.  04/10/2023  Ms. Biffle is seen today in follow-up.  Over the summer we performed bilateral ABIs because of complaints of claudication.  She has a history of subclavian stenosis and mild bilateral carotid disease.  Her ABIs were significantly abnormal indicating bilateral PAD with ABI of 0.48 on the right and 0.58 on the left.  TBI's were low as well.  She had a recent callus removal of her foot.  She says she does get some heaviness in her legs and pain but can walk some distance to church although her daughter notes that she has had to stop walking when she is around her often.  She is transitioned over to Repatha twice monthly from the Pushtronex system.  She has not had repeat labs since August at which time her LDL was 153.  Blood pressure was initially elevated today however improved to 124/74 with recheck.  PMHx:  Past Medical History:   Diagnosis Date   Asthma    Bronchitis    Diabetes mellitus    type II   HX: breast cancer    Hyperlipidemia    Hypertension     Past Surgical History:  Procedure Laterality Date   BREAST LUMPECTOMY  1994   left   right port-a-cath  1994   TONSILLECTOMY      FAMHx:  Family History  Problem Relation Age of Onset   Heart disease Mother  CAD/MI   Aneurysm Father        CVA   Stroke Sister    Hypertension Sister    Breast cancer Other    Diabetes Other    Hypertension Other    Cancer Brother    Cancer Brother     SOCHx:   reports that she has never smoked. She quit smokeless tobacco use about 60 years ago. She reports that she does not drink alcohol and does not use drugs.  ALLERGIES:  Allergies  Allergen Reactions   Promethazine-Codeine [Promethazine-Codeine] Nausea And Vomiting    Pt states cough syrup she can't take   Atorvastatin Other (See Comments)    REACTION: legs ache    ROS: Pertinent items noted in HPI and remainder of comprehensive ROS otherwise negative.  HOME MEDS: Current Outpatient Medications  Medication Sig Dispense Refill   albuterol (VENTOLIN HFA) 108 (90 Base) MCG/ACT inhaler USE 2 INHALATIONS BY MOUTH  EVERY 6 HOURS AS NEEDED FOR WHEEZING OR SHORTNESS OF  BREATH 48 g 1   amLODipine (NORVASC) 5 MG tablet TAKE 1 TABLET BY MOUTH DAILY 100 tablet 0   aspirin EC 81 MG tablet Take 81 mg by mouth daily.     blood glucose meter kit and supplies KIT Dispense based on patient and insurance preference. Use up to four times daily as directed. (FOR ICD-9 250.00, 250.01). 1 each 0   Coenzyme Q10 (COQ10) 50 MG CAPS Take 50 mg by mouth daily.      Evolocumab (REPATHA SURECLICK) 140 MG/ML SOAJ Inject 140 mg into the skin every 14 (fourteen) days. 6 mL 3   ezetimibe (ZETIA) 10 MG tablet Take 1 tablet (10 mg total) by mouth daily. 90 tablet 3   fluticasone (FLONASE) 50 MCG/ACT nasal spray Use 2 spray(s) in each nostril once daily 48 g 3   glucose  blood (ACCU-CHEK GUIDE) test strip USE AS DIRECTED TO  TEST  BLOOD  SUGARS  4  TIMES  DAILY 100 each 1   metFORMIN (GLUCOPHAGE) 500 MG tablet TAKE 1 TABLET BY MOUTH TWICE DAILY WITH A MEAL 180 tablet 0   rosuvastatin (CRESTOR) 10 MG tablet TAKE 1 TABLET BY MOUTH 3  TIMES WEEKLY 45 tablet 3   SYMBICORT 80-4.5 MCG/ACT inhaler INHALE 2 PUFFS BY MOUTH ONCE DAILY 11 g 0   triamcinolone cream (KENALOG) 0.1 % Apply 1 application topically 2 (two) times daily. 45 g 0   fexofenadine (ALLEGRA) 180 MG tablet Take 1 tablet (180 mg total) by mouth daily. 90 tablet 3   No current facility-administered medications for this visit.    LABS/IMAGING: No results found for this or any previous visit (from the past 48 hours). No results found.  WEIGHTS: Wt Readings from Last 3 Encounters:  04/10/23 117 lb (53.1 kg)  11/25/22 114 lb 6.4 oz (51.9 kg)  12/31/21 107 lb 6.4 oz (48.7 kg)    VITALS: BP 124/74   Pulse 96   Ht 5\' 2"  (1.575 m)   Wt 117 lb (53.1 kg)   SpO2 96%   BMI 21.40 kg/m   EXAM: General appearance: alert and no distress Neck: no carotid bruit, no JVD and thyroid not enlarged, symmetric, no tenderness/mass/nodules Lungs: clear to auscultation bilaterally Heart: regular rate and rhythm Abdomen: soft, non-tender; bowel sounds normal; no masses,  no organomegaly Extremities: extremities normal, atraumatic, no cyanosis or edema Pulses: 2+ and symmetric Skin: Skin color, texture, turgor normal. 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   Mrs. Nauta sounds like she may be  having some symptoms of claudication.  She seems to minimize it but her daughter notes that she has had some leg weakness and need to stop when walking certain distances.  ABIs were severely reduced in August.  Will go ahead and get lower extremity segmental Dopplers.  I discussed the case with Dr. Allyson Sabal and he will kindly see her in referral.  Will plan repeat lipids today although she is not fasting hopefully demonstrated marked improvement in her lipids on Repatha.  Plan follow-up in 6 months or sooner as necessary.  Chrystie Nose, MD, [facility], FACP    [facility]  Medical Director of the [facility] Lipid [facility]  Cardiovascular Risk Reduction Clinic Diplomate of the American Board of Clinical Lipidology Attending Cardiologist  Direct Dial: [phone]  Fax: [phone]  Website:  [email or web address] Nicely Roquel Burgin 04/10/2023, 9:44 AM

## 2023-04-10 NOTE — Patient Instructions (Addendum)
Medication Instructions:  NO CHANGES  *If you need a refill on your cardiac medications before your next appointment, please call your pharmacy*  Lab Work: LIPID PANEL today   Testing/Procedures: Lower Extremity Arterial Doppler   Follow-Up: At [facility], you and your health needs are our priority.  As part of our continuing mission to provide you with exceptional heart care, we have created designated Provider Care Teams.  These Care Teams include your primary Cardiologist (physician) and Advanced Practice Providers (APPs -  Physician Assistants and Nurse Practitioners) who all work together to provide you with the care you need, when you need it.  We recommend signing up for the patient portal called "MyChart".  Sign up information is provided on this After Visit Summary.  MyChart is used to connect with patients for Virtual Visits (Telemedicine).  Patients are able to view lab/test results, encounter notes, upcoming appointments, etc.  Non-urgent messages can be sent to your provider as well.   To learn more about what you can do with MyChart, go to [email or web address].    Your next appointment:    You have been referred to Dr. Nanetta Batty -- peripheral vascular specialist   6 month routine appointment with Dr. Rennis Golden

## 2023-04-11 ENCOUNTER — Telehealth: Payer: Self-pay | Admitting: Internal Medicine

## 2023-04-11 MED ORDER — ROSUVASTATIN CALCIUM 10 MG PO TABS: ORAL_TABLET | ORAL | 3 refills | Status: AC

## 2023-04-11 NOTE — Telephone Encounter (Signed)
*  STAT* If patient is at the pharmacy, call can be transferred to refill team.   1. Which medications need to be refilled? (please list name of each medication and dose if known)   rosuvastatin (CRESTOR) 10 MG tablet   2. Would you like to learn more about the convenience, safety, & potential cost savings by using the [facility]?   3. Are you open to using the [facility] (Type [facility]. ).  4. Which pharmacy/location (including street and city if local pharmacy) is medication to be sent to?  [facility] - [place], [place] - [address]   5. Do they need a 30 day or 90 day supply?   90 day  Daughter Marylene Land) stated patient is completely out of this medication.

## 2023-04-11 NOTE — Telephone Encounter (Signed)
Pt's medication was sent to pt's pharmacy as requested. Confirmation received.  °

## 2023-04-12 ENCOUNTER — Other Ambulatory Visit (HOSPITAL_COMMUNITY): Payer: Self-pay | Admitting: Internal Medicine

## 2023-04-12 DIAGNOSIS — I739 Peripheral vascular disease, unspecified: Secondary | ICD-10-CM

## 2023-04-13 ENCOUNTER — Ambulatory Visit (HOSPITAL_COMMUNITY)
Admission: RE | Admit: 2023-04-13 | Discharge: 2023-04-13 | Disposition: A | Discharge status: Home / Self Care | Payer: Medicare Other | Source: Ambulatory Visit | Attending: Internal Medicine | Admitting: Internal Medicine

## 2023-04-13 ENCOUNTER — Ambulatory Visit (HOSPITAL_BASED_OUTPATIENT_CLINIC_OR_DEPARTMENT_OTHER)
Admission: RE | Admit: 2023-04-13 | Discharge: 2023-04-13 | Disposition: A | Discharge status: Home / Self Care | Payer: Medicare Other | Source: Ambulatory Visit | Attending: Internal Medicine | Admitting: Internal Medicine

## 2023-04-13 DIAGNOSIS — I739 Peripheral vascular disease, unspecified: Secondary | ICD-10-CM | POA: Insufficient documentation

## 2023-04-13 DIAGNOSIS — R6889 Other general symptoms and signs: Secondary | ICD-10-CM | POA: Diagnosis not present

## 2023-04-16 LAB — VAS US ABI WITH/WO TBI
Left ABI: 0.67
Right ABI: 0.64

## 2023-04-24 ENCOUNTER — Telehealth: Payer: Self-pay | Admitting: Emergency Medicine

## 2023-04-24 ENCOUNTER — Encounter: Payer: Self-pay | Admitting: Internal Medicine

## 2023-04-24 NOTE — Telephone Encounter (Signed)
Spoke with Rena (dpr) appt made for 05/09/23 at 08:30 with Dr Allyson Sabal for [facility]

## 2023-05-01 ENCOUNTER — Other Ambulatory Visit: Payer: Self-pay | Admitting: Family

## 2023-05-01 ENCOUNTER — Inpatient Hospital Stay (HOSPITAL_COMMUNITY): Admission: RE | Admit: 2023-05-01 | Payer: Medicare Other | Source: Ambulatory Visit

## 2023-05-06 ENCOUNTER — Other Ambulatory Visit: Payer: Self-pay | Admitting: Family

## 2023-05-06 DIAGNOSIS — I10 Essential (primary) hypertension: Secondary | ICD-10-CM

## 2023-05-09 ENCOUNTER — Ambulatory Visit: Payer: Medicare Other | Attending: Cardiovascular Disease | Admitting: Cardiovascular Disease

## 2023-05-19 ENCOUNTER — Ambulatory Visit (INDEPENDENT_AMBULATORY_CARE_PROVIDER_SITE_OTHER): Payer: Medicare Other | Admitting: Family

## 2023-05-19 ENCOUNTER — Encounter: Payer: Self-pay | Admitting: Family

## 2023-05-19 VITALS — BP 138/80 | HR 97 | Ht 62.0 in | Wt 116.8 lb

## 2023-05-19 DIAGNOSIS — E119 Type 2 diabetes mellitus without complications: Secondary | ICD-10-CM | POA: Diagnosis not present

## 2023-05-19 DIAGNOSIS — J45909 Unspecified asthma, uncomplicated: Secondary | ICD-10-CM | POA: Diagnosis not present

## 2023-05-19 DIAGNOSIS — Z7984 Long term (current) use of oral hypoglycemic drugs: Secondary | ICD-10-CM | POA: Diagnosis not present

## 2023-05-19 DIAGNOSIS — J4520 Mild intermittent asthma, uncomplicated: Secondary | ICD-10-CM

## 2023-05-19 DIAGNOSIS — E559 Vitamin D deficiency, unspecified: Secondary | ICD-10-CM | POA: Diagnosis not present

## 2023-05-19 DIAGNOSIS — Z0000 Encounter for general adult medical examination without abnormal findings: Secondary | ICD-10-CM | POA: Diagnosis not present

## 2023-05-19 MED ORDER — BREZTRI AEROSPHERE 160-9-4.8 MCG/ACT IN AERO: 2.0000 | INHALATION_SPRAY | Freq: Two times a day (BID) | RESPIRATORY_TRACT | 1 refills | Status: DC

## 2023-05-19 MED ORDER — PREDNISONE 20 MG PO TABS: 20.0000 mg | ORAL_TABLET | Freq: Every day | ORAL | 0 refills | Status: DC

## 2023-05-19 MED ORDER — AZITHROMYCIN 250 MG PO TABS: ORAL_TABLET | ORAL | 0 refills | Status: AC

## 2023-05-19 NOTE — Progress Notes (Signed)
Diane Richmond is a 78 y.o. female with the following history as recorded in EpicCare:  Patient Active Problem List   Diagnosis Date Noted   PAD (peripheral artery disease) (HCC) 01/29/2016   Bilateral carotid bruits 07/31/2015   Claudication (HCC) 07/31/2015   Medicare annual wellness visit, subsequent 06/11/2015   Allergic rhinitis 09/01/2011   Routine general medical examination at a health care facility 09/01/2011   Other screening mammogram 09/01/2011   LBBB (left bundle branch block) 05/25/2011   Controlled type 2 diabetes mellitus without complication, without long-term current use of insulin (HCC) 03/12/2010   Familial hypercholesteremia 03/12/2010   Essential hypertension 03/12/2010   Asthma 03/12/2010    Current Outpatient Medications  Medication Sig Dispense Refill   albuterol (VENTOLIN HFA) 108 (90 Base) MCG/ACT inhaler USE 2 INHALATIONS BY MOUTH  EVERY 6 HOURS AS NEEDED FOR WHEEZING OR SHORTNESS OF  BREATH 48 g 1   amLODipine (NORVASC) 5 MG tablet Take 1 tablet (5 mg total) by mouth daily. 90 tablet 0   aspirin EC 81 MG tablet Take 81 mg by mouth daily.     azithromycin (ZITHROMAX) 250 MG tablet Take 2 tablets on day 1, then 1 tablet daily on days 2 through 5 6 tablet 0   blood glucose meter kit and supplies KIT Dispense based on patient and insurance preference. Use up to four times daily as directed. (FOR ICD-9 250.00, 250.01). 1 each 0   Budeson-Glycopyrrol-Formoterol (BREZTRI AEROSPHERE) 160-9-4.8 MCG/ACT AERO Inhale 2 puffs into the lungs in the morning and at bedtime. 5.9 g 1   Coenzyme Q10 (COQ10) 50 MG CAPS Take 50 mg by mouth daily.      Evolocumab (REPATHA SURECLICK) 140 MG/ML SOAJ Inject 140 mg into the skin every 14 (fourteen) days. 6 mL 3   ezetimibe (ZETIA) 10 MG tablet Take 1 tablet (10 mg total) by mouth daily. 90 tablet 3   fluticasone (FLONASE) 50 MCG/ACT nasal spray Use 2 spray(s) in each nostril once daily 48 g 3   glucose blood (ACCU-CHEK GUIDE) test  strip USE AS DIRECTED TO  TEST  BLOOD  SUGARS  4  TIMES  DAILY 100 each 1   metFORMIN (GLUCOPHAGE) 500 MG tablet TAKE 1 TABLET BY MOUTH TWICE DAILY WITH A MEAL 180 tablet 0   predniSONE (DELTASONE) 20 MG tablet Take 1 tablet (20 mg total) by mouth daily with breakfast. 5 tablet 0   rosuvastatin (CRESTOR) 10 MG tablet TAKE 1 TABLET BY MOUTH 3  TIMES WEEKLY 45 tablet 3   triamcinolone cream (KENALOG) 0.1 % Apply 1 application topically 2 (two) times daily. 45 g 0   fexofenadine (ALLEGRA) 180 MG tablet Take 1 tablet (180 mg total) by mouth daily. 90 tablet 3   No current facility-administered medications for this visit.    Allergies: Promethazine-codeine [promethazine-codeine] and Atorvastatin  Past Medical History:  Diagnosis Date   Asthma    Bronchitis    Diabetes mellitus    type II   HX: breast cancer    Hyperlipidemia    Hypertension     Past Surgical History:  Procedure Laterality Date   BREAST LUMPECTOMY  1994   left   right port-a-cath  1994   TONSILLECTOMY      Family History  Problem Relation Age of Onset   Heart disease Mother        CAD/MI   Aneurysm Father        CVA   Stroke Sister    Hypertension  Sister    Breast cancer Other    Diabetes Other    Hypertension Other    Cancer Brother    Cancer Brother     Social History   Tobacco Use   Smoking status: Never   Smokeless tobacco: Former    Quit date: 03/02/1963  Substance Use Topics   Alcohol use: No    Subjective:   Presents for yearly CPE; has been having more problems with her asthma/ cough for the past month- unable to afford Symbicort and not taking it regularly; also requesting referral to nutritionist; Defers vaccines or DEXA today; continuing to work with cardiology regularly;   Review of Systems  Constitutional: Negative.   HENT: Negative.    Eyes: Negative.   Respiratory:  Positive for cough and wheezing.   Cardiovascular: Negative.   Gastrointestinal: Negative.   Genitourinary:   Positive for dysuria.  Musculoskeletal: Negative.   Skin: Negative.   Neurological: Negative.   Endo/Heme/Allergies: Negative.   Psychiatric/Behavioral: Negative.       Objective:  Vitals:   05/19/23 1421  BP: 138/80  Pulse: 97  SpO2: 99%  Weight: 116 lb 12.8 oz (53 kg)  Height: 5\' 2"  (1.575 m)    General: Well developed, well nourished, in no acute distress  Skin : Warm and dry.  Head: Normocephalic and atraumatic  Eyes: Sclera and conjunctiva clear; pupils round and reactive to light; extraocular movements intact  Ears: External normal; canals clear; tympanic membranes normal  Oropharynx: Pink, supple. No suspicious lesions  Neck: Supple without thyromegaly, adenopathy  Lungs: Respirations unlabored; mild wheezing noted in upper lobes CVS exam: normal rate and regular rhythm.  Abdomen: Soft; nontender; nondistended; normoactive bowel sounds; no masses or hepatosplenomegaly  Musculoskeletal: No deformities; no active joint inflammation  Extremities: No edema, cyanosis, clubbing  Vessels: Symmetric bilaterally  Neurologic: Alert and oriented; speech intact; face symmetrical; moves all extremities well; CNII-XII intact without focal deficit   Assessment:  1. PE (physical exam), annual   2. Controlled type 2 diabetes mellitus without complication, without long-term current use of insulin (HCC)   3. Vitamin D deficiency   4. Mild intermittent asthma without complication   5. Acute asthmatic bronchitis     Plan:  Age appropriate preventive healthcare needs addressed; encouraged regular eye doctor and dental exams; encouraged regular exercise; will update labs and refills as needed today; follow-up to be determined;  For asthma, will change to [facility] but she will call back if too expensive; short term Rx for prednisone for 20 mg every day x 5 days and Z-pak for suspected acute bronchitis; follow up in 1 month, sooner prn.   Return in about 1 month (around 06/19/2023) for  follow up.  Orders Placed This Encounter  Procedures   CBC with Differential/Platelet   Comp Met (CMET)   Hemoglobin A1c   Vitamin D (25 hydroxy)   Amb Referral to Nutrition and Diabetic Education    Referral Priority:   Routine    Referral Type:   Consultation    Referral Reason:   Specialty Services Required    Number of Visits Requested:   1    Requested Prescriptions   Signed Prescriptions Disp Refills   Budeson-Glycopyrrol-Formoterol (BREZTRI AEROSPHERE) 160-9-4.8 MCG/ACT AERO 5.9 g 1    Sig: Inhale 2 puffs into the lungs in the morning and at bedtime.   predniSONE (DELTASONE) 20 MG tablet 5 tablet 0    Sig: Take 1 tablet (20 mg total) by mouth daily with  breakfast.   azithromycin (ZITHROMAX) 250 MG tablet 6 tablet 0    Sig: Take 2 tablets on day 1, then 1 tablet daily on days 2 through 5    .Marland KitchenMarland Kitchen

## 2023-05-20 LAB — COMPREHENSIVE METABOLIC PANEL
AG Ratio: 1.3 (calc) (ref 1.0–2.5)
ALT: 10 U/L (ref 6–29)
AST: 19 U/L (ref 10–35)
Albumin: 4.2 g/dL (ref 3.6–5.1)
Alkaline phosphatase (APISO): 75 U/L (ref 37–153)
BUN: 21 mg/dL (ref 7–25)
CO2: 26 mmol/L (ref 20–32)
Calcium: 9.3 mg/dL (ref 8.6–10.4)
Chloride: 105 mmol/L (ref 98–110)
Creat: 0.68 mg/dL (ref 0.60–1.00)
Globulin: 3.2 g/dL (ref 1.9–3.7)
Glucose, Bld: 100 mg/dL — ABNORMAL HIGH (ref 65–99)
Potassium: 3.6 mmol/L (ref 3.5–5.3)
Sodium: 141 mmol/L (ref 135–146)
Total Bilirubin: 0.3 mg/dL (ref 0.2–1.2)
Total Protein: 7.4 g/dL (ref 6.1–8.1)

## 2023-05-20 LAB — VITAMIN D 25 HYDROXY (VIT D DEFICIENCY, FRACTURES): Vit D, 25-Hydroxy: 8 ng/mL — ABNORMAL LOW (ref 30–100)

## 2023-05-20 LAB — CBC WITH DIFFERENTIAL/PLATELET
Absolute Lymphocytes: 2099 {cells}/uL (ref 850–3900)
Absolute Monocytes: 673 {cells}/uL (ref 200–950)
Basophils Absolute: 79 {cells}/uL (ref 0–200)
Basophils Relative: 0.8 %
Eosinophils Absolute: 535 {cells}/uL — ABNORMAL HIGH (ref 15–500)
Eosinophils Relative: 5.4 %
HCT: 39.4 % (ref 35.0–45.0)
Hemoglobin: 13.2 g/dL (ref 11.7–15.5)
MCH: 30.4 pg (ref 27.0–33.0)
MCHC: 33.5 g/dL (ref 32.0–36.0)
MCV: 90.8 fL (ref 80.0–100.0)
MPV: 10.7 fL (ref 7.5–12.5)
Monocytes Relative: 6.8 %
Neutro Abs: 6514 {cells}/uL (ref 1500–7800)
Neutrophils Relative %: 65.8 %
Platelets: 223 10*3/uL (ref 140–400)
RBC: 4.34 10*6/uL (ref 3.80–5.10)
RDW: 11.8 % (ref 11.0–15.0)
Total Lymphocyte: 21.2 %
WBC: 9.9 10*3/uL (ref 3.8–10.8)

## 2023-05-20 LAB — HEMOGLOBIN A1C
Hgb A1c MFr Bld: 6.2 %{Hb} — ABNORMAL HIGH (ref <5.7)
Mean Plasma Glucose: 131 mg/dL
eAG (mmol/L): 7.3 mmol/L

## 2023-05-22 ENCOUNTER — Other Ambulatory Visit: Payer: Self-pay | Admitting: Family

## 2023-05-22 MED ORDER — VITAMIN D (ERGOCALCIFEROL) 1.25 MG (50000 UNIT) PO CAPS: 50000.0000 [IU] | ORAL_CAPSULE | ORAL | 0 refills | Status: AC

## 2023-05-30 ENCOUNTER — Encounter: Payer: Self-pay | Admitting: Cardiovascular Disease

## 2023-05-30 ENCOUNTER — Ambulatory Visit: Payer: Medicare Other | Attending: Cardiovascular Disease | Admitting: Cardiovascular Disease

## 2023-05-30 ENCOUNTER — Other Ambulatory Visit: Payer: Self-pay | Admitting: Family

## 2023-05-30 VITALS — BP 132/80 | HR 81 | Ht 62.0 in | Wt 116.8 lb

## 2023-05-30 DIAGNOSIS — I739 Peripheral vascular disease, unspecified: Secondary | ICD-10-CM

## 2023-05-30 DIAGNOSIS — R0989 Other specified symptoms and signs involving the circulatory and respiratory systems: Secondary | ICD-10-CM | POA: Diagnosis not present

## 2023-05-30 NOTE — Patient Instructions (Signed)
 Medication Instructions:  Your physician recommends that you continue on your current medications as directed. Please refer to the Current Medication list given to you today.  *If you need a refill on your cardiac medications before your next appointment, please call your pharmacy*   Follow-Up: At [facility], you and your health needs are our priority.  As part of our continuing mission to provide you with exceptional heart care, we have created designated Provider Care Teams.  These Care Teams include your primary Cardiologist (physician) and Advanced Practice Providers (APPs -  Physician Assistants and Nurse Practitioners) who all work together to provide you with the care you need, when you need it.  We recommend signing up for the patient portal called "MyChart".  Sign up information is provided on this After Visit Summary.  MyChart is used to connect with patients for Virtual Visits (Telemedicine).  Patients are able to view lab/test results, encounter notes, upcoming appointments, etc.  Non-urgent messages can be sent to your provider as well.   To learn more about what you can do with MyChart, go to [email or web address].    Your next appointment:   We will see you on an as needed basis  Provider:   Nanetta Batty, MD     Other Instructions

## 2023-05-30 NOTE — Assessment & Plan Note (Signed)
 Recent carotid Dopplers performed 12/23/2020 revealed no evidence of ICA stenosis.  There was mild increase in velocity in her left subclavian artery with a 15 mm upper extremity blood pressure differential suggesting the possibility of left subclavian artery stenosis although there was antegrade flow in her left vertebral and she denies any symptoms referable to her subclavian artery.

## 2023-05-30 NOTE — Progress Notes (Signed)
 05/30/2023 Diane Richmond   [birth year]  [id number]  Primary Physician Jason Leita Repine, FNP Primary Cardiologist: Dorn JINNY Lesches MD GENI CODY MADEIRA, FSCAI  HPI:  Diane Richmond is a 78 y.o. thin-appearing married African-American female mother of 4 children, grandmother of 4 grandchildren who is accompanied by one of her daughters Laray today.  She was referred to the courtesy of Dr. Mona, her primary cardiologist, for peripheral vascular evaluation because of abnormal Doppler studies.  She is retired from being a engineer, drilling.  Her risk factors include treated hypertension, diabetes and hyperlipidemia.  She never smoked.  There is no family history of heart disease.  She is never had a heart attack or stroke.  She denies chest pain or shortness of breath.  She does yard work without limitation.  She has had Doppler studies according to Dr. Katharina note dating back to 2000 and and 17 which have been abnormal.  Her most recent Doppler studies performed 04/13/2023 revealed ABIs in the 0.66 range with occluded SFAs bilaterally.  When asked about lifestyle-limiting symptoms she denies significant claudication affecting her daily activities.   Current Meds  Medication Sig   albuterol  (VENTOLIN  HFA) 108 (90 Base) MCG/ACT inhaler USE 2 INHALATIONS BY MOUTH  EVERY 6 HOURS AS NEEDED FOR WHEEZING OR SHORTNESS OF  BREATH   amLODipine  (NORVASC ) 5 MG tablet Take 1 tablet (5 mg total) by mouth daily.   aspirin EC 81 MG tablet Take 81 mg by mouth daily.   blood glucose meter kit and supplies KIT Dispense based on patient and insurance preference. Use up to four times daily as directed. (FOR ICD-9 250.00, 250.01).   Budeson-Glycopyrrol-Formoterol  (BREZTRI  AEROSPHERE) 160-9-4.8 MCG/ACT AERO Inhale 2 puffs into the lungs in the morning and at bedtime.   Coenzyme Q10 (COQ10) 50 MG CAPS Take 50 mg by mouth daily.    Evolocumab  (REPATHA  SURECLICK) 140 MG/ML SOAJ Inject 140 mg into the skin every  14 (fourteen) days.   ezetimibe  (ZETIA ) 10 MG tablet Take 1 tablet (10 mg total) by mouth daily.   fluticasone  (FLONASE ) 50 MCG/ACT nasal spray Use 2 spray(s) in each nostril once daily   glucose blood (ACCU-CHEK GUIDE) test strip USE AS DIRECTED TO  TEST  BLOOD  SUGARS  4  TIMES  DAILY   metFORMIN  (GLUCOPHAGE ) 500 MG tablet TAKE 1 TABLET BY MOUTH TWICE DAILY WITH A MEAL   rosuvastatin  (CRESTOR ) 10 MG tablet TAKE 1 TABLET BY MOUTH 3  TIMES WEEKLY   triamcinolone  cream (KENALOG ) 0.1 % Apply 1 application topically 2 (two) times daily.   Vitamin D , Ergocalciferol , (DRISDOL ) 1.25 MG (50000 UNIT) CAPS capsule Take 1 capsule (50,000 Units total) by mouth every 7 (seven) days for 12 doses.     Allergies  Allergen Reactions   Promethazine -Codeine  [Promethazine -Codeine ] Nausea And Vomiting    Pt states cough syrup she can't take   Atorvastatin  Other (See Comments)    REACTION: legs ache    Social History   Socioeconomic History   Marital status: Married    Spouse name: Not on file   Number of children: 4   Years of education: 8   Highest education level: Not on file  Occupational History   Occupation: chief strategy officer - retired  Tobacco Use   Smoking status: Never   Smokeless tobacco: Former    Quit date: 03/02/1963  Vaping Use   Vaping status: Never Used  Substance and Sexual Activity   Alcohol use: No  Drug use: No   Sexual activity: Not Currently  Other Topics Concern   Not on file  Social History Narrative   8th grade. Married '67 - .  4 dtrs - '67, '70, '72, '78. Grandchildren - 4. 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 Lives with husband.    Denies abuse and feels safe at home.    Social Drivers of Corporate Investment Banker Strain: Low Risk  (01/13/2022)   Overall Financial Resource Strain (CARDIA)    Difficulty of Paying Living Expenses: Not hard at all  Food Insecurity: No Food Insecurity (01/13/2022)   Hunger Vital Sign    Worried About Running Out of Food in the Last Year:  Never true    Ran Out of Food in the Last Year: Never true  Transportation Needs: No Transportation Needs (01/13/2022)   PRAPARE - Administrator, Civil Service (Medical): No    Lack of Transportation (Non-Medical): No  Physical Activity: Inactive (01/13/2022)   Exercise Vital Sign    Days of Exercise per Week: 0 days    Minutes of Exercise per Session: 0 min  Stress: No Stress Concern Present (01/13/2022)   [employer] of Occupational Health - Occupational Stress Questionnaire    Feeling of Stress : Not at all  Social Connections: Moderately Isolated (01/13/2022)   Social Connection and Isolation Panel [NHANES]    Frequency of Communication with Friends and Family: More than three times a week    Frequency of Social Gatherings with Friends and Family: More than three times a week    Attends Religious Services: Never    Database Administrator or Organizations: No    Attends Banker Meetings: Never    Marital Status: Married  Catering Manager Violence: Not At Risk (01/13/2022)   Humiliation, Afraid, Rape, and Kick questionnaire    Fear of Current or Ex-Partner: No    Emotionally Abused: No    Physically Abused: No    Sexually Abused: No     Review of Systems: General: negative for chills, fever, night sweats or weight changes.  Cardiovascular: negative for chest pain, dyspnea on exertion, edema, orthopnea, palpitations, paroxysmal nocturnal dyspnea or shortness of breath Dermatological: negative for rash Respiratory: negative for cough or wheezing Urologic: negative for hematuria Abdominal: negative for nausea, vomiting, diarrhea, bright red blood per rectum, melena, or hematemesis Neurologic: negative for visual changes, syncope, or dizziness All other systems reviewed and are otherwise negative except as noted above.    Blood pressure 132/80, pulse 81, height 5' 2 (1.575 m), weight 116 lb 12.8 oz (53 kg), SpO2 98%.  General appearance: alert and no  distress Neck: no adenopathy, no JVD, supple, symmetrical, trachea midline, thyroid  not enlarged, symmetric, no tenderness/mass/nodules, and bilateral carotid bruits Lungs: clear to auscultation bilaterally Heart: Soft outflow tract murmur Extremities: extremities normal, atraumatic, no cyanosis or edema Pulses: Absent pedal pulses. Skin: Skin color, texture, turgor normal. No rashes or lesions Neurologic: Grossly normal  EKG EKG Interpretation Date/Time:  Tuesday May 30 2023 08:49:27 EST Ventricular Rate:  81 PR Interval:  184 QRS Duration:  126 QT Interval:  420 QTC Calculation: 487 R Axis:   36  Text Interpretation: Normal sinus rhythm Left bundle branch block When compared with ECG of 14-Mar-2012 11:52, T wave inversion no longer evident in Inferior leads Confirmed by Court Carrier [phone]) on 05/30/2023 8:52:16 AM    ASSESSMENT AND PLAN:   PAD (peripheral artery disease) (HCC) Ms. Wahab was referred  to me by Dr. Mona for PAD.  She has had Dopplers that been abnormal dating back at least to 2017.  She really denies lifestyle-limiting claudication.  Her most recent Doppler studies performed 04/13/2023 revealed ABIs and a 0.6 range with occluded SFAs bilaterally.  Given her lack of symptoms I do not feel compelled either to begin her on a medication or to recommend endovascular therapy.  I will see her back as needed.  Bilateral carotid bruits Recent carotid Dopplers performed 12/23/2020 revealed no evidence of ICA stenosis.  There was mild increase in velocity in her left subclavian artery with a 15 mm upper extremity blood pressure differential suggesting the possibility of left subclavian artery stenosis although there was antegrade flow in her left vertebral and she denies any symptoms referable to her subclavian artery.     Dorn DOROTHA Lesches MD FACP,FACC,FAHA, [facility] 05/30/2023 9:02 AM

## 2023-05-30 NOTE — Assessment & Plan Note (Signed)
 Diane Richmond was referred to me by Dr. Mona for PAD.  She has had Dopplers that been abnormal dating back at least to 2017.  She really denies lifestyle-limiting claudication.  Her most recent Doppler studies performed 04/13/2023 revealed ABIs and a 0.6 range with occluded SFAs bilaterally.  Given her lack of symptoms I do not feel compelled either to begin her on a medication or to recommend endovascular therapy.  I will see her back as needed.

## 2023-06-01 ENCOUNTER — Telehealth: Payer: Self-pay

## 2023-06-01 NOTE — Telephone Encounter (Signed)
 Copied from CRM [id number]. Topic: General - Call Back - No Documentation >> Jun 01, 2023 11:14 AM Pinkey ORN wrote: Reason for CRM: Returning Office Call >> Jun 01, 2023 11:15 AM Pinkey ORN wrote: Patient states she's returning an office call from [place].

## 2023-06-01 NOTE — Telephone Encounter (Signed)
 Spoke with pts daughter, pt is aware of results and expressed understanding.

## 2023-06-27 ENCOUNTER — Ambulatory Visit: Payer: Medicare Other | Admitting: Family

## 2023-07-05 ENCOUNTER — Other Ambulatory Visit: Payer: Self-pay | Admitting: Family

## 2023-07-05 DIAGNOSIS — I10 Essential (primary) hypertension: Secondary | ICD-10-CM

## 2023-07-21 DIAGNOSIS — H2513 Age-related nuclear cataract, bilateral: Secondary | ICD-10-CM | POA: Diagnosis not present

## 2023-07-21 DIAGNOSIS — H40033 Anatomical narrow angle, bilateral: Secondary | ICD-10-CM | POA: Diagnosis not present

## 2023-07-23 ENCOUNTER — Other Ambulatory Visit: Payer: Self-pay | Admitting: Family

## 2023-08-06 ENCOUNTER — Other Ambulatory Visit: Payer: Self-pay | Admitting: Family

## 2023-08-09 ENCOUNTER — Other Ambulatory Visit: Payer: Self-pay | Admitting: Internal Medicine

## 2023-08-28 ENCOUNTER — Telehealth: Payer: Self-pay | Admitting: Family

## 2023-08-28 DIAGNOSIS — E559 Vitamin D deficiency, unspecified: Secondary | ICD-10-CM

## 2023-08-28 NOTE — Telephone Encounter (Signed)
 I have this pt on the schedule for 5/16 just need orders put in please

## 2023-08-28 NOTE — Telephone Encounter (Signed)
 Chart reviewed, Pt needing Vit D. Order placed

## 2023-09-06 ENCOUNTER — Other Ambulatory Visit: Payer: Self-pay | Admitting: Family

## 2023-09-06 DIAGNOSIS — I10 Essential (primary) hypertension: Secondary | ICD-10-CM

## 2023-09-08 ENCOUNTER — Ambulatory Visit: Payer: Self-pay

## 2023-09-08 ENCOUNTER — Other Ambulatory Visit: Payer: Self-pay | Admitting: Family

## 2023-09-08 ENCOUNTER — Other Ambulatory Visit: Payer: Medicare Other

## 2023-09-08 DIAGNOSIS — E559 Vitamin D deficiency, unspecified: Secondary | ICD-10-CM

## 2023-09-08 NOTE — Addendum Note (Signed)
 Addended by: Bayard Bottcher B on: 09/08/2023 02:41 PM   Modules accepted: Orders

## 2023-09-08 NOTE — Telephone Encounter (Signed)
 Reason for Triage: Patient new Metformin  is giving her diarrhea.    Chief Complaint: Pt. Latest refill of metformin  "looked a little different and has caused diarrhea. Told daughter "I can't take this anymore." Please advise. Symptoms: diarrhea Frequency: several days Pertinent Negatives: Patient denies  Disposition: [] ED /[] Urgent Care (no appt availability in office) / [] Appointment(In office/virtual)/ []  [facility]/ [] Home Care/ [] Refused Recommended Disposition /[] [facility]/ [x]  Follow-up with PCP Additional Notes: Please advise pt.  Reason for Disposition  [1] Caller has URGENT medicine question about med that PCP or specialist prescribed AND [2] triager unable to answer question  Answer Assessment - Initial Assessment Questions 1. NAME of MEDICINE: "What medicine(s) are you calling about?"     Metformin  2. QUESTION: "What is your question?" (e.g., double dose of medicine, side effect)     Causing diarrhea 3. PRESCRIBER: "Who prescribed the medicine?" Reason: if prescribed by specialist, call should be referred to that group.     [facility] 4. SYMPTOMS: "Do you have any symptoms?" If Yes, ask: "What symptoms are you having?"  "How bad are the symptoms (e.g., mild, moderate, severe)     diarrhea 5. PREGNANCY:  "Is there any chance that you are pregnant?" "When was your last menstrual period?"     no  Protocols used: Medication Question Call-A-AH

## 2023-09-08 NOTE — Telephone Encounter (Signed)
 Spoke with pts daughter, pt is aware and expressed understanding. Pts daughter states she will call back next week to let us  know what the pharmacy says.

## 2023-09-09 LAB — VITAMIN D 25 HYDROXY (VIT D DEFICIENCY, FRACTURES): Vit D, 25-Hydroxy: 65 ng/mL (ref 30–100)

## 2023-09-11 ENCOUNTER — Ambulatory Visit: Payer: Self-pay | Admitting: Family

## 2023-09-29 ENCOUNTER — Other Ambulatory Visit: Payer: Self-pay | Admitting: Family

## 2023-10-02 ENCOUNTER — Other Ambulatory Visit: Payer: Self-pay | Admitting: Family

## 2023-10-02 NOTE — Telephone Encounter (Signed)
 Copied from CRM [id number]. Topic: Clinical - Medication Refill >> Oct 02, 2023  9:38 AM Marlan Silva wrote: Medication:  blood glucose meter kit and supplies KIT  Mainly the test strips   Has the patient contacted their pharmacy? Yes (Agent: If no, request that the patient contact the pharmacy for the refill. If patient does not wish to contact the pharmacy document the reason why and proceed with request.) (Agent: If yes, when and what did the pharmacy advise?)  This is the patient's preferred pharmacy:  [facility] [address] [place], [place] - [address] [place] [address] Phone: [phone] Fax: [phone]  [facility] - [place], [place] - [address] [place] [address] [place] [address] Phone: [phone] Fax: [phone]  [facility] - [place], [place] - [address] [place] [address] Phone: [phone] Fax: [phone]  Is this the correct pharmacy for this prescription? Yes If no, delete pharmacy and type the correct one.   Has the prescription been filled recently? Yes  Is the patient out of the medication? Yes  Has the patient been seen for an appointment in the last year OR does the patient have an upcoming appointment? Yes  Can we respond through MyChart? Yes  Agent: Please be advised that Rx refills may take up to 3 business days. We ask that you follow-up with your pharmacy.

## 2023-10-12 ENCOUNTER — Encounter: Payer: Self-pay | Admitting: Family

## 2023-11-19 ENCOUNTER — Other Ambulatory Visit: Payer: Self-pay | Admitting: Family

## 2023-11-28 ENCOUNTER — Other Ambulatory Visit: Payer: Self-pay

## 2023-11-28 ENCOUNTER — Other Ambulatory Visit: Payer: Self-pay | Admitting: Family

## 2023-11-28 ENCOUNTER — Emergency Department (HOSPITAL_COMMUNITY)
Admission: EM | Admit: 2023-11-28 | Discharge: 2023-11-28 | Disposition: A | Discharge status: Home / Self Care | Attending: Emergency Medicine | Admitting: Emergency Medicine

## 2023-11-28 ENCOUNTER — Emergency Department (HOSPITAL_COMMUNITY)

## 2023-11-28 ENCOUNTER — Encounter (HOSPITAL_COMMUNITY): Payer: Self-pay

## 2023-11-28 DIAGNOSIS — I10 Essential (primary) hypertension: Secondary | ICD-10-CM | POA: Diagnosis not present

## 2023-11-28 DIAGNOSIS — R059 Cough, unspecified: Secondary | ICD-10-CM | POA: Insufficient documentation

## 2023-11-28 DIAGNOSIS — Z743 Need for continuous supervision: Secondary | ICD-10-CM | POA: Diagnosis not present

## 2023-11-28 DIAGNOSIS — Z853 Personal history of malignant neoplasm of breast: Secondary | ICD-10-CM | POA: Diagnosis not present

## 2023-11-28 DIAGNOSIS — Z87891 Personal history of nicotine dependence: Secondary | ICD-10-CM | POA: Insufficient documentation

## 2023-11-28 DIAGNOSIS — Z7982 Long term (current) use of aspirin: Secondary | ICD-10-CM | POA: Diagnosis not present

## 2023-11-28 DIAGNOSIS — Z7984 Long term (current) use of oral hypoglycemic drugs: Secondary | ICD-10-CM | POA: Diagnosis not present

## 2023-11-28 DIAGNOSIS — E119 Type 2 diabetes mellitus without complications: Secondary | ICD-10-CM | POA: Diagnosis not present

## 2023-11-28 DIAGNOSIS — J4521 Mild intermittent asthma with (acute) exacerbation: Secondary | ICD-10-CM | POA: Diagnosis not present

## 2023-11-28 DIAGNOSIS — R0602 Shortness of breath: Secondary | ICD-10-CM | POA: Diagnosis not present

## 2023-11-28 DIAGNOSIS — R069 Unspecified abnormalities of breathing: Secondary | ICD-10-CM | POA: Diagnosis not present

## 2023-11-28 DIAGNOSIS — Z79899 Other long term (current) drug therapy: Secondary | ICD-10-CM | POA: Diagnosis not present

## 2023-11-28 LAB — COMPREHENSIVE METABOLIC PANEL WITH GFR
ALT: 14 U/L (ref 0–44)
AST: 27 U/L (ref 15–41)
Albumin: 3.9 g/dL (ref 3.5–5.0)
Alkaline Phosphatase: 71 U/L (ref 38–126)
Anion gap: 10 (ref 5–15)
BUN: 19 mg/dL (ref 8–23)
CO2: 26 mmol/L (ref 22–32)
Calcium: 9.3 mg/dL (ref 8.9–10.3)
Chloride: 105 mmol/L (ref 98–111)
Creatinine, Ser: 0.59 mg/dL (ref 0.44–1.00)
GFR, Estimated: >60 mL/min (ref >60)
Glucose, Bld: 143 mg/dL — ABNORMAL HIGH (ref 70–99)
Potassium: 4.2 mmol/L (ref 3.5–5.1)
Sodium: 141 mmol/L (ref 135–145)
Total Bilirubin: 0.6 mg/dL (ref 0.0–1.2)
Total Protein: 8.4 g/dL — ABNORMAL HIGH (ref 6.5–8.1)

## 2023-11-28 LAB — RESP PANEL BY RT-PCR (RSV, FLU A&B, COVID)  RVPGX2
Influenza A by PCR: NEGATIVE
Influenza B by PCR: NEGATIVE
Resp Syncytial Virus by PCR: NEGATIVE
SARS Coronavirus 2 by RT PCR: NEGATIVE

## 2023-11-28 LAB — CBC WITH DIFFERENTIAL/PLATELET
Abs Immature Granulocytes: 0.04 K/uL (ref 0.00–0.07)
Basophils Absolute: 0.1 K/uL (ref 0.0–0.1)
Basophils Relative: 1 %
Eosinophils Absolute: 0.9 K/uL — ABNORMAL HIGH (ref 0.0–0.5)
Eosinophils Relative: 9 %
HCT: 44.4 % (ref 36.0–46.0)
Hemoglobin: 13.9 g/dL (ref 12.0–15.0)
Immature Granulocytes: 0 %
Lymphocytes Relative: 34 %
Lymphs Abs: 3.6 K/uL (ref 0.7–4.0)
MCH: 29.5 pg (ref 26.0–34.0)
MCHC: 31.3 g/dL (ref 30.0–36.0)
MCV: 94.3 fL (ref 80.0–100.0)
Monocytes Absolute: 0.9 K/uL (ref 0.1–1.0)
Monocytes Relative: 8 %
Neutro Abs: 5.1 K/uL (ref 1.7–7.7)
Neutrophils Relative %: 48 %
Platelets: 241 K/uL (ref 150–400)
RBC: 4.71 MIL/uL (ref 3.87–5.11)
RDW: 12 % (ref 11.5–15.5)
WBC: 10.6 K/uL — ABNORMAL HIGH (ref 4.0–10.5)
nRBC: 0 % (ref 0.0–0.2)

## 2023-11-28 MED ORDER — BUDESONIDE-FORMOTEROL FUMARATE 160-4.5 MCG/ACT IN AERO: 2.0000 | INHALATION_SPRAY | Freq: Two times a day (BID) | RESPIRATORY_TRACT | 1 refills | Status: DC

## 2023-11-28 MED ORDER — SODIUM CHLORIDE 0.9 % IV BOLUS
1000.0000 mL | Freq: Once | INTRAVENOUS | Status: AC
  Administered 2023-11-28: 1000 mL via INTRAVENOUS

## 2023-11-28 MED ORDER — BREZTRI AEROSPHERE 160-9-4.8 MCG/ACT IN AERO: 2.0000 | INHALATION_SPRAY | Freq: Two times a day (BID) | RESPIRATORY_TRACT | 1 refills | Status: DC

## 2023-11-28 MED ORDER — IPRATROPIUM-ALBUTEROL 0.5-2.5 (3) MG/3ML IN SOLN
3.0000 mL | Freq: Once | RESPIRATORY_TRACT | Status: AC
  Administered 2023-11-28: 3 mL via RESPIRATORY_TRACT
  Filled 2023-11-28: qty 3

## 2023-11-28 MED ORDER — PREDNISONE 10 MG PO TABS: 20.0000 mg | ORAL_TABLET | Freq: Every day | ORAL | 0 refills | Status: AC

## 2023-11-28 MED ORDER — BUDESON-GLYCOPYRROL-FORMOTEROL 160-9-4.8 MCG/ACT IN AERO
2.0000 | INHALATION_SPRAY | Freq: Once | RESPIRATORY_TRACT | Status: DC
  Filled 2023-11-28: qty 5.9
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
## 2023-11-28 NOTE — ED Triage Notes (Signed)
 Pt arrives via EMS from home. Pt called EMS due to sob, productive cough, and wheezing that started this morning. EMS administered 1 duoneb and 125mg  of solumedrol.

## 2023-11-28 NOTE — Discharge Instructions (Addendum)
 We evaluated you for your trouble breathing.  Your testing was reassuring.  Your chest x-ray did not show pneumonia and your COVID testing was negative.  We suspect your symptoms are due to an asthma exacerbation.  We have prescribed you a course of steroids.  Please start these tomorrow.  We have also sent a refill for your Symbicort .  Your primary doctor had prescribed you a different medication.  If you would like to continue with Symbicort  please discuss this with your primary doctor.  If you have any new or worsening symptoms such as worsening cough, fevers, chest pain, difficulty breathing, or any other new symptoms, please return to the emergency department.

## 2023-11-29 ENCOUNTER — Other Ambulatory Visit: Payer: Self-pay | Admitting: Family

## 2023-11-29 ENCOUNTER — Ambulatory Visit: Payer: Self-pay

## 2023-11-29 MED ORDER — ALBUTEROL SULFATE HFA 108 (90 BASE) MCG/ACT IN AERS: 1.0000 | INHALATION_SPRAY | Freq: Four times a day (QID) | RESPIRATORY_TRACT | 1 refills | Status: AC | PRN

## 2023-11-29 NOTE — Telephone Encounter (Signed)
 FYI Only or Action Required?: Action required by provider: clinical question for provider.  Patient was last seen in primary care on 05/19/2023 by Jason Leita Repine, FNP.  Called Nurse Triage reporting Medication Reaction.  Copied from CRM [id number]. Topic: Clinical - Red Word Triage >> Nov 29, 2023 11:01 AM Berneda FALCON wrote: Red Word that prompted transfer to Nurse Triage:  Daughter Laray, wanted to talk with nurse about meds-had asthma attack yesterday-took to ER-breathing treatment-inhaler outdated-SYMBICORT  was outdated.  Meds prescribed were SYMBICORT  and breztri  but was told that they are not able to get mixed together. Answer Assessment - Initial Assessment Questions Pt went to ED yesterday d/t asthma attack, daughter is concerned that symbicort  and breztri  cannot be used by the pt together. Daughter states that she is also concerned about using breztri  and albuterol . Per daughter she has not used the breztri  at this point, please advise.  Daughter also asking which vitamin D  pt should be on.  Protocols used: Medication Question Call-A-AH

## 2023-11-29 NOTE — Telephone Encounter (Signed)
 Copied from CRM #[id number]. Topic: Clinical - Medication Refill >> Nov 29, 2023 11:02 AM Berneda FALCON wrote: Medication: albuterol  (VENTOLIN  HFA) 108 (90 Base) MCG/ACT inhaler  Has the patient contacted their pharmacy? Yes (Agent: If no, request that the patient contact the pharmacy for the refill. If patient does not wish to contact the pharmacy document the reason why and proceed with request.) (Agent: If yes, when and what did the pharmacy advise?)  This is the patient's preferred pharmacy:  [facility] [address] [place], [place] - [address] [place] [address] Phone: [phone] Fax: [phone]  [facility]) - [place], [place] - [address] [facility] [address] [place] [address] [place] [address] Phone: [phone] Fax: [phone]  [facility] - [place], [place] - [address] [place] [address] Phone: [phone] Fax: [phone]  Is this the correct pharmacy for this prescription? Yes If no, delete pharmacy and type the correct one.   Has the prescription been filled recently? No  Is the patient out of the medication? Yes  Has the patient been seen for an appointment in the last year OR does the patient have an upcoming appointment? Yes  Can we respond through MyChart? No  Agent: Please be advised that Rx refills may take up to 3 business days. We ask that you follow-up with your pharmacy.

## 2023-12-14 ENCOUNTER — Other Ambulatory Visit: Payer: Self-pay | Admitting: Family

## 2023-12-30 LAB — NMR, LIPOPROFILE
Cholesterol, Total: 199 mg/dL (ref 100–199)
HDL Particle Number: 22.2 umol/L — ABNORMAL LOW (ref >=30.5)
HDL-C: 44 mg/dL (ref >39)
LDL Particle Number: 2224 nmol/L — ABNORMAL HIGH (ref <1000)
LDL Size: 20 nm — ABNORMAL LOW (ref >20.5)
LDL-C (NIH Calc): 142 mg/dL — ABNORMAL HIGH (ref 0–99)
LP-IR Score: 64 — ABNORMAL HIGH (ref <=45)
Small LDL Particle Number: 1531 nmol/L — ABNORMAL HIGH (ref <=527)
Triglycerides: 72 mg/dL (ref 0–149)

## 2024-01-02 ENCOUNTER — Encounter (HOSPITAL_BASED_OUTPATIENT_CLINIC_OR_DEPARTMENT_OTHER): Admitting: Nurse Practitioner

## 2024-01-02 NOTE — Progress Notes (Deleted)
 Cardiology Office Note   Date:  01/02/2024  ID:  Diane, Richmond Jan 31, 1946, MRN [id number] PCP: Jason Leita Repine, FNP  [facility] Providers Cardiologist:  None { Click to update primary MD,subspecialty MD or APP then REFRESH:1}    PMH Dyslipidemia PAD Hypertension Type 2 diabetes Left bundle branch block Family history early ASCVD Statin intolerance  Followed by Dr. Mona since 2017 for hyperlipidemia.  Family history significant for mother who died of massive heart attack at age 56, father had a stroke and died at age 65, and a sister who died of a stroke.  Baseline LDL 245.9 in November 2013.  She had myalgias on atorvastatin  40 and 80 mg, pravastatin 40 mg, and rosuvastatin  20 mg.  Nuclear stress test negative for ischemia.  TTE showed EF 60 to 65%, grade 1 diastolic dysfunction, mild TR MPI with RVSP 31 mmHg.  She was referred to Dr. Court for abnormal ABIs at 0.24 on the right and 0.4 on the left.  She had significant improvement in lipids on rosuvastatin  10 mg daily and ezetimibe  10 mg daily with LDL down to 125 from 237.  She was advised to start Repatha  140 mg every 14 days.  LDL improved to 58 on Repatha  in November 2018.  She was referred to Dr. Fairy for genetic testing, but lifestyle was felt to have mostly contributed to poorly controlled cholesterol.  In September 2020 she reported not being on Repatha  for 5 to 6 months due to not being able to get the medication.  Last clinic visit was 04/10/2023 with Dr. Mona.  She had resumed Repatha  140 mg every 14 days.  LDL in August was 153.  She was continued on Repatha , ezetimibe  10 mg daily, and rosuvastatin  10 mg 3 days weekly.  Seen by Dr. Court in 2017 and more recently 05/30/23 for evaluation of PAD with abnormal vascular study 03/2023 that revealed occluded SFAs bilaterally in 0.6 range on ABI. No evidence of ICA stenosis on carotid dopplers, mild subclavian stenosis 11/2020. Given lack of symptoms, prn  follow-up advised.   Lipid panel completed 12/29/2023 revealed LDL particle #2224, LDL-C 857, HDL-C 44, triglycerides 72, total cholesterol 199, and small LDL-P 1531  History of Present Illness Diane Richmond is a 78 y.o. female ***  ROS: ***  Studies Reviewed      ***  Lipoprotein (a)  Date/Time Value Ref Range Status  02/04/2016 11:57 AM 221 (H) <75 nmol/L Final    Comment:      Risk: Optimal < 75 nmol/L; Moderate 75-125 nmol/L; High > 125 nmol/L Cardiovascular event risk category cut points (optimal, moderate, high) are based on Marcovina et al. Clin Chem. 7996;50:8214 and Nordestgaard et al. European Heart J. [phone] (results of meta-analysis and expert panel recommendations).     Risk Assessment/Calculations {Does this patient have ATRIAL FIBRILLATION?[phone]} No BP recorded.  {Refresh Note OR Click here to enter BP  :1}***       Physical Exam VS:  There were no vitals taken for this visit.   Wt Readings from Last 3 Encounters:  05/30/23 116 lb 12.8 oz (53 kg)  05/19/23 116 lb 12.8 oz (53 kg)  04/10/23 117 lb (53.1 kg)    GEN: Well nourished, well developed in no acute distress NECK: No JVD; No carotid bruits CARDIAC: ***RRR, no murmurs, rubs, gallops RESPIRATORY:  Clear to auscultation without rales, wheezing or rhonchi  ABDOMEN: Soft, non-tender, non-distended EXTREMITIES:  No edema; No deformity   ASSESSMENT AND  PLAN ***    {Are you ordering a CV Procedure (e.g. stress test, cath, DCCV, TEE, etc)?   Press F2        :[id number]}  Dispo: ***  Signed, Rosaline Bane, NP-C

## 2024-01-04 ENCOUNTER — Telehealth: Payer: Self-pay | Admitting: Family

## 2024-01-04 NOTE — Telephone Encounter (Signed)
 Copied from CRM [id number]. Topic: Medicare AWV >> Jan 04, 2024 10:53 AM Nathanel DEL wrote: Reason for CRM: Called LVM 01/04/2024 to schedule AWV. Please schedule Virtual or Telehealth visits ONLY.   Nathanel Paschal; Care Guide Ambulatory Clinical Support  l [facility] Direct Dial: [phone]

## 2024-01-05 ENCOUNTER — Other Ambulatory Visit: Payer: Self-pay | Admitting: Family

## 2024-01-11 ENCOUNTER — Ambulatory Visit: Payer: Self-pay | Admitting: Internal Medicine

## 2024-01-25 NOTE — Progress Notes (Signed)
 Cardiology Office Note   Date:  01/31/2024  ID:  Diane, Richmond 1946/03/29, MRN [id number] PCP: Jason Leita Repine, FNP (Inactive)   HeartCare Providers Cardiologist:  None     PMH Dyslipidemia PAD Hypertension Type 2 diabetes Left bundle branch block Family history early ASCVD Statin intolerance  Followed by Dr. Mona since 2017 for hyperlipidemia.  Family history significant for mother who died of massive heart attack at age 78, father had a stroke and died at age 64, and a sister who died of a stroke.  Baseline LDL 245.9 in November 2013.  She had myalgias on atorvastatin  40 and 80 mg, pravastatin 40 mg, and rosuvastatin  20 mg.  Nuclear stress test negative for ischemia.  TTE showed EF 60 to 65%, grade 1 diastolic dysfunction, mild TR MPI with RVSP 31 mmHg.  She was referred to Dr. Court for abnormal ABIs at 0.24 on the right and 0.4 on the left.  She had significant improvement in lipids on rosuvastatin  10 mg daily and ezetimibe  10 mg daily with LDL down to 125 from 237.  She was advised to start Repatha  140 mg every 14 days.  LDL improved to 58 on Repatha  in November 2018.  She was referred to Dr. Fairy for genetic testing, but lifestyle was felt to have mostly contributed to poorly controlled cholesterol.  In September 2020 she reported not being on Repatha  for 5 to 6 months due to not being able to get the medication.  Last clinic visit was 04/10/2023 with Dr. Mona.  She had resumed Repatha  140 mg every 14 days.  LDL in August was 153.  She was continued on Repatha , ezetimibe  10 mg daily, and rosuvastatin  10 mg 3 days weekly.  Seen by Dr. Court in 2017 and more recently 05/30/23 for evaluation of PAD with abnormal vascular study 03/2023 that revealed occluded SFAs bilaterally in 0.6 range on ABI. No evidence of ICA stenosis on carotid dopplers, mild subclavian stenosis 11/2020. Given lack of symptoms, prn follow-up advised.   Lipid panel completed 12/29/2023  revealed LDL particle #2224, LDL-C 857, HDL-C 44, triglycerides 72, total cholesterol 199, and small LDL-P 1531  History of Present Illness Discussed the use of AI scribe software for clinical note transcription with the patient, who gave verbal consent to proceed.  History of Present Illness Diane Richmond is a very pleasant 78 year old female who is here today for follow-up of hyperlipidemia. She is accompanied by her daughter.  Fortunately been out of her Repatha  for several months due to cost.  Her daughter admits she should have called our office but did not call to report.  We discussed the reason for prescribing PCSK9 inhibitor including history of elevated LP(a), and significantly elevated LDL particle numbers.  She has continued on rosuvastatin  and ezetimibe  with no concerning side effects.  She did not have any concerning side effects on Repatha . She experiences occasional shortness of breath, particularly in hot and cold environments, which she attributes to asthma. She had a recent episode requiring a breathing treatment. She denies chest pain, orthopnea, PND, or edema. Occasional dizziness, possibly related to temperature changes and sinus issues. Occasional palpitations occur but are infrequent. Her blood pressure is elevated at 158/72, but she has not yet taken her medication today.  She remains active, engaging in yard work regularly.    ROS: See HPI  Studies Reviewed     Echo 12/30/22 1. Left ventricular ejection fraction, by estimation, is 60 to 65%. Left  ventricular ejection fraction by 3D volume is 58 %. The left ventricle has  normal function. The left ventricle has no regional wall motion  abnormalities. There is mild concentric  left ventricular hypertrophy. Left ventricular diastolic parameters are  consistent with Grade I diastolic dysfunction (impaired relaxation). The  average left ventricular global longitudinal strain is -19.3 %. The global  longitudinal strain is  normal.   2. Right ventricular systolic function is normal. The right ventricular  size is normal. There is normal pulmonary artery systolic pressure.   3. The mitral valve is normal in structure. Trivial mitral valve  regurgitation. No evidence of mitral stenosis.   4. The aortic valve has an indeterminant number of cusps. Aortic valve  regurgitation is not visualized. Aortic valve sclerosis/calcification is  present, without any evidence of aortic stenosis.   5. The inferior vena cava is normal in size with greater than 50%  respiratory variability, suggesting right atrial pressure of 3 mmHg.  Lipoprotein (a)  Date/Time Value Ref Range Status  02/04/2016 11:57 AM 221 (H) <75 nmol/L Final    Comment:      Risk: Optimal < 75 nmol/L; Moderate 75-125 nmol/L; High > 125 nmol/L Cardiovascular event risk category cut points (optimal, moderate, high) are based on Marcovina et al. Clin Chem. 7996;50:8214 and Nordestgaard et al. European Heart J. [phone] (results of meta-analysis and expert panel recommendations).     Risk Assessment/Calculations   HYPERTENSION CONTROL Vitals:   01/31/24 1029 01/31/24 1319  BP: (!) 158/72 (!) 160/80    The patient's blood pressure is elevated above target today.  In order to address the patient's elevated BP: Blood pressure will be monitored at home to determine if medication changes need to be made. (did not take anti-hypertensive medication prior to appointment)          Physical Exam VS:  BP (!) 160/80   Pulse 69   Ht 5' 2 (1.575 m)   Wt 117 lb (53.1 kg)   SpO2 99%   BMI 21.40 kg/m    Wt Readings from Last 3 Encounters:  01/31/24 117 lb (53.1 kg)  05/30/23 116 lb 12.8 oz (53 kg)  05/19/23 116 lb 12.8 oz (53 kg)    GEN: Well nourished, well developed in no acute distress NECK: No JVD; No carotid bruits CARDIAC: RRR, 2/6 systolic murmurs. 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  We were not notified of the need for assistance.  She has not had any concerning side effects on Repatha .  She has continued rosuvastatin  and ezetimibe  with no concerning side effects.  Reviewed with patient and daughter the importance of adherence with lipid-lowering therapy due to significantly elevated LDL and elevated LP(a).  She has known PAD.  Low risk Myoview 08/2015. She denies chest pain, dyspnea, or other symptoms concerning for angina.  No indication for further ischemic evaluation at this time.  - Resume Repatha  140 mg every 14 days  - Call with concerns related to cost  -Consider Praluent and oral alternatives if Repatha  is not cost effective - Recheck lipid panel after four Repatha  injections, approximately two months post-restart -Continue rosuvastatin  10 mg  daily and ezetimibe  10 mg daily -Continue aspirin, amlodipine   Essential hypertension   BP is elevated in clinic and remains elevated on my recheck.  She has not taken amlodipine  yet this morning.  She has a home BP cuff but has not been monitoring recently. Blood pressure management is crucial to prevent cardiovascular complications. Discussed dietary sodium impact. -Monitor home BP daily for goal < 140/80 mmHg -Provide blood pressure monitoring guidelines -Low sodium, heart healthy diet emphasized. DASH and mediterranean diet information provided  - Continue physically active lifestyle  - Continue amlodipine  5 mg daily  PAD Referred to Dr. Court for symptom of claudication and ABIs severely reduced on vascular testing. Minimal carotid artery plaque on duplex in 2022.  Known occlusion of bilateral SFAs.  Due to lack of symptoms, she elected to manage PAD conservatively. - No acute concerns today  Murmur   Aortic valve sclerosis TTE 12/2022 revealed normal LVEF, aortic valve sclerosis without stenosis.  She has some shortness of breath which she attributes to asthma.  She is overall asymptomatic.  We discussed symptoms to monitor and report. -Monitor for symptom changes such as shortness of breath, chest discomfort and report prior to next appointment -Consider repeat TTE at next office visit, sooner if clinically indicated        Dispo: 6 months with Dr. Mona or me  Signed, Rosaline Bane, NP-C

## 2024-01-31 ENCOUNTER — Ambulatory Visit (HOSPITAL_BASED_OUTPATIENT_CLINIC_OR_DEPARTMENT_OTHER): Admitting: Nurse Practitioner

## 2024-01-31 ENCOUNTER — Encounter (HOSPITAL_BASED_OUTPATIENT_CLINIC_OR_DEPARTMENT_OTHER): Payer: Self-pay | Admitting: Nurse Practitioner

## 2024-01-31 VITALS — BP 160/80 | HR 69 | Ht 62.0 in | Wt 117.0 lb

## 2024-01-31 DIAGNOSIS — R011 Cardiac murmur, unspecified: Secondary | ICD-10-CM

## 2024-01-31 DIAGNOSIS — I10 Essential (primary) hypertension: Secondary | ICD-10-CM

## 2024-01-31 DIAGNOSIS — E78019 Familial hypercholesterolemia, unspecified: Secondary | ICD-10-CM | POA: Diagnosis not present

## 2024-01-31 DIAGNOSIS — Z7189 Other specified counseling: Secondary | ICD-10-CM | POA: Diagnosis not present

## 2024-01-31 DIAGNOSIS — E7841 Elevated Lipoprotein(a): Secondary | ICD-10-CM | POA: Diagnosis not present

## 2024-01-31 DIAGNOSIS — I739 Peripheral vascular disease, unspecified: Secondary | ICD-10-CM

## 2024-01-31 MED ORDER — REPATHA SURECLICK 140 MG/ML ~~LOC~~ SOAJ: 140.0000 mg | SUBCUTANEOUS | 3 refills | Status: AC

## 2024-01-31 NOTE — Patient Instructions (Signed)
 Medication Instructions:   START Repatha    Route: Inject 140 mg into the skin every 14 (fourteen) days. - Subcutaneous      *If you need a refill on your cardiac medications before your next appointment, please call your pharmacy*  Lab Work:  Your physician recommends that you return for a FASTING NMR, fasting after midnight, 2-3 months after restarting Repatha .  Patient given paperwork today.   If you have labs (blood work) drawn today and your tests are completely normal, you will receive your results only by: MyChart Message (if you have MyChart) OR A paper copy in the mail If you have any lab test that is abnormal or we need to change your treatment, we will call you to review the results.  Testing/Procedures:  None ordered.  Follow-Up: At [facility], you and your health needs are our priority.  As part of our continuing mission to provide you with exceptional heart care, our providers are all part of one team.  This team includes your primary Cardiologist (physician) and Advanced Practice Providers or APPs (Physician Assistants and Nurse Practitioners) who all work together to provide you with the care you need, when you need it.  Your next appointment:   6 month(s)  Provider:   K. Italy Hilty, MD or Rosaline Bane, NP    We recommend signing up for the patient portal called MyChart.  Sign up information is provided on this After Visit Summary.  MyChart is used to connect with patients for Virtual Visits (Telemedicine).  Patients are able to view lab/test results, encounter notes, upcoming appointments, etc.  Non-urgent messages can be sent to your provider as well.   To learn more about what you can do with MyChart, go to [email or web address].   Other Instructions  Your physician wants you to follow-up in: 6 months.  You will receive a reminder letter in the mail two months in advance. If you don't receive a letter, please call our office to schedule the  follow-up appointment.  HOW TO TAKE YOUR BLOOD PRESSURE  Rest 5 minutes before taking your blood pressure. Don't  smoke or drink caffeinated beverages for at least 30 minutes before. Take your blood pressure before (not after) you eat. Sit comfortably with your back supported and both feet on the floor ( don't cross your legs). Elevate your arm to heart level on a table or a desk. Use the proper sized cuff.  It should fit smoothly and snugly around your bare upper arm.  There should be  Enough room to slip a fingertip under the cuff.  The bottom edge of the cuff should be 1 inch above the crease Of the elbow. Please monitor your blood pressure once daily 2 hours after your am medication. Your goal blood pressure Is 140 (systolic) top number or over 80 ( diastolic) bottom number or below.   ----Avoid cold medicines with D or DM at the end of them----      Mediterranean Diet  Why follow it? Research shows. Those who follow the Mediterranean diet have a reduced risk of heart disease  The diet is associated with a reduced incidence of Parkinson's and Alzheimer's diseases People following the diet may have longer life expectancies and lower rates of chronic diseases  The Dietary Guidelines for Americans recommends the Mediterranean diet as an eating plan to promote health and prevent disease  What Is the Mediterranean Diet?  Healthy eating plan based on typical foods and recipes of Mediterranean-style cooking The  diet is primarily a plant based diet; these foods should make up a majority of meals   Starches - Plant based foods should make up a majority of meals - They are an important sources of vitamins, minerals, energy, antioxidants, and fiber - Choose whole grains, foods high in fiber and minimally processed items  - Typical grain sources include wheat, oats, barley, corn, brown rice, bulgar, farro, millet, polenta, couscous  - Various types of beans include chickpeas, lentils, fava  beans, black beans, white beans   Fruits  Veggies - Large quantities of antioxidant rich fruits & veggies; 6 or more servings  - Vegetables can be eaten raw or lightly drizzled with oil and cooked  - Vegetables common to the traditional Mediterranean Diet include: artichokes, arugula, beets, broccoli, brussel sprouts, cabbage, carrots, celery, collard greens, cucumbers, eggplant, kale, leeks, lemons, lettuce, mushrooms, okra, onions, peas, peppers, potatoes, pumpkin, radishes, rutabaga, shallots, spinach, sweet potatoes, turnips, zucchini - Fruits common to the Mediterranean Diet include: apples, apricots, avocados, cherries, clementines, dates, figs, grapefruits, grapes, melons, nectarines, oranges, peaches, pears, pomegranates, strawberries, tangerines  Fats - Replace butter and margarine with healthy oils, such as olive oil, canola oil, and tahini  - Limit nuts to no more than a handful a day  - Nuts include walnuts, almonds, pecans, pistachios, pine nuts  - Limit or avoid candied, honey roasted or heavily salted nuts - Olives are central to the [employer] - can be eaten whole or used in a variety of dishes   Meats Protein - Limiting red meat: no more than a few times a month - When eating red meat: choose lean cuts and keep the portion to the size of deck of cards - Eggs: approx. 0 to 4 times a week  - Fish and lean poultry: at least 2 a week  - Healthy protein sources include, chicken, [place], lean beef, lamb - Increase intake of seafood such as tuna, salmon, trout, mackerel, shrimp, scallops - Avoid or limit high fat processed meats such as sausage and bacon  Dairy - Include moderate amounts of low fat dairy products  - Focus on healthy dairy such as fat free yogurt, skim milk, low or reduced fat cheese - Limit dairy products higher in fat such as whole or 2% milk, cheese, ice cream  Alcohol - Moderate amounts of red wine is ok  - No more than 5 oz daily for women (all ages) and  men older than age 63  - No more than 10 oz of wine daily for men younger than 46  Other - Limit sweets and other desserts  - Use herbs and spices instead of salt to flavor foods  - Herbs and spices common to the traditional Mediterranean Diet include: basil, bay leaves, chives, cloves, cumin, fennel, garlic, lavender, marjoram, mint, oregano, parsley, pepper, rosemary, sage, savory, sumac, tarragon, thyme   It's not just a diet, it's a lifestyle:  The Mediterranean diet includes lifestyle factors typical of those in the region  Foods, drinks and meals are best eaten with others and savored Daily physical activity is important for overall good health This could be strenuous exercise like running and aerobics This could also be more leisurely activities such as walking, housework, yard-work, or taking the stairs Moderation is the key; a balanced and healthy diet accommodates most foods and drinks Consider portion sizes and frequency of consumption of certain foods   Meal Ideas & Options:  Breakfast:  Whole wheat toast or whole wheat  English muffins with peanut butter & hard boiled egg Steel cut oats topped with apples & cinnamon and skim milk  Fresh fruit: banana, strawberries, melon, berries, peaches  Smoothies: strawberries, bananas, greek yogurt, peanut butter Low fat greek yogurt with blueberries and granola  Egg white omelet with spinach and mushrooms Breakfast couscous: whole wheat couscous, apricots, skim milk, cranberries  Sandwiches:  Hummus and grilled vegetables (peppers, zucchini, squash) on whole wheat bread   Grilled chicken on whole wheat pita with lettuce, tomatoes, cucumbers or tzatziki  [place] salad on whole wheat bread: tuna salad made with greek yogurt, olives, red peppers, capers, green onions Garlic rosemary lamb pita: lamb sauted with garlic, rosemary, salt & pepper; add lettuce, cucumber, greek yogurt to pita - flavor with lemon juice and black pepper  Seafood:   Mediterranean grilled salmon, seasoned with garlic, basil, parsley, lemon juice and black pepper Shrimp, lemon, and spinach whole-grain pasta salad made with low fat greek yogurt  Seared scallops with lemon orzo  Seared tuna steaks seasoned salt, pepper, coriander topped with tomato mixture of olives, tomatoes, olive oil, minced garlic, parsley, green onions and cappers  Meats:  Herbed greek chicken salad with kalamata olives, cucumber, feta  Red bell peppers stuffed with spinach, bulgur, lean ground beef (or lentils) & topped with feta   Kebabs: skewers of chicken, tomatoes, onions, zucchini, squash  [place] burgers: made with red onions, mint, dill, lemon juice, feta cheese topped with roasted red peppers Vegetarian Cucumber salad: cucumbers, artichoke hearts, celery, red onion, feta cheese, tossed in olive oil & lemon juice  Hummus and whole grain pita points with a greek salad (lettuce, tomato, feta, olives, cucumbers, red onion) Lentil soup with celery, carrots made with vegetable broth, garlic, salt and pepper  Tabouli salad: parsley, bulgur, mint, scallions, cucumbers, tomato, radishes, lemon juice, olive oil, salt and pepper.  DASH Eating Plan DASH stands for Dietary Approaches to Stop Hypertension. The DASH eating plan is a healthy eating plan that has been shown to: Lower high blood pressure (hypertension). Reduce your risk for type 2 diabetes, heart disease, and stroke. Help with weight loss. What are tips for following this plan? Reading food labels Check food labels for the amount of salt (sodium) per serving. Choose foods with less than 5 percent of the Daily Value (DV) of sodium. In general, foods with less than 300 milligrams (mg) of sodium per serving fit into this eating plan. To find whole grains, look for the word whole as the first word in the ingredient list. Shopping Buy products labeled as low-sodium or no salt added. Buy fresh foods. Avoid canned foods and  pre-made or frozen meals. Cooking Try not to add salt when you cook. Use salt-free seasonings or herbs instead of table salt or sea salt. Check with your health care provider or pharmacist before using salt substitutes. Do not fry foods. Cook foods in healthy ways, such as baking, boiling, grilling, roasting, or broiling. Cook using oils that are good for your heart. These include olive, canola, avocado, soybean, and sunflower oil. Meal planning  Eat a balanced diet. This should include: 4 or more servings of fruits and 4 or more servings of vegetables each day. Try to fill half of your plate with fruits and vegetables. 6-8 servings of whole grains each day. 6 or less servings of lean meat, poultry, or fish each day. 1 oz is 1 serving. A 3 oz (85 g) serving of meat is about the same size as the palm  of your hand. One egg is 1 oz (28 g). 2-3 servings of low-fat dairy each day. One serving is 1 cup (237 mL). 1 serving of nuts, seeds, or beans 5 times each week. 2-3 servings of heart-healthy fats. Healthy fats called omega-3 fatty acids are found in foods such as walnuts, flaxseeds, fortified milks, and eggs. These fats are also found in cold-water fish, such as sardines, salmon, and mackerel. Limit how much you eat of: Canned or prepackaged foods. Food that is high in trans fat, such as fried foods. Food that is high in saturated fat, such as fatty meat. Desserts and other sweets, sugary drinks, and other foods with added sugar. Full-fat dairy products. Do not salt foods before eating. Do not eat more than 4 egg yolks a week. Try to eat at least 2 vegetarian meals a week. Eat more home-cooked food and less restaurant, buffet, and fast food. Lifestyle When eating at a restaurant, ask if your food can be made with less salt or no salt. If you drink alcohol: Limit how much you have to: 0-1 drink a day if you are female. 0-2 drinks a day if you are female. Know how much alcohol is in your  drink. In the [place] one drink is one 12 oz bottle of beer (355 mL), one 5 oz glass of wine (148 mL), or one 1 oz glass of hard liquor (44 mL). General information Avoid eating more than 2,300 mg of salt a day. If you have hypertension, you may need to reduce your sodium intake to 1,500 mg a day. Work with your provider to stay at a healthy body weight or lose weight. Ask what the best weight range is for you. On most days of the week, get at least 30 minutes of exercise that causes your heart to beat faster. This may include walking, swimming, or biking. Work with your provider or dietitian to adjust your eating plan to meet your specific calorie needs. What foods should I eat? Fruits All fresh, dried, or frozen fruit. Canned fruits that are in their natural juice and do not have sugar added to them. Vegetables Fresh or frozen vegetables that are raw, steamed, roasted, or grilled. Low-sodium or reduced-sodium tomato and vegetable juice. Low-sodium or reduced-sodium tomato sauce and tomato paste. Low-sodium or reduced-sodium canned vegetables. Grains Whole-grain or whole-wheat bread. Whole-grain or whole-wheat pasta. Brown rice. Mcneil Madeira. Bulgur. Whole-grain and low-sodium cereals. Pita bread. Low-fat, low-sodium crackers. Whole-wheat flour tortillas. Meats and other proteins Skinless chicken or mal[place]. Ground chicken or [place]. Pork with fat trimmed off. Fish and seafood. Egg whites. Dried beans, peas, or lentils. Unsalted nuts, nut butters, and seeds. Unsalted canned beans. Lean cuts of beef with fat trimmed off. Low-sodium, lean precooked or cured meat, such as sausages or meat loaves. Dairy Low-fat (1%) or fat-free (skim) milk. Reduced-fat, low-fat, or fat-free cheeses. Nonfat, low-sodium ricotta or cottage cheese. Low-fat or nonfat yogurt. Low-fat, low-sodium cheese. Fats and oils Soft margarine without trans fats. Vegetable oil. Reduced-fat, low-fat, or light mayonnaise and salad  dressings (reduced-sodium). Canola, safflower, olive, avocado, soybean, and sunflower oils. Avocado. Seasonings and condiments Herbs. Spices. Seasoning mixes without salt. Other foods Unsalted popcorn and pretzels. Fat-free sweets. The items listed above may not be all the foods and drinks you can have. Talk to a dietitian to learn more. What foods should I avoid? Fruits Canned fruit in a light or heavy syrup. Fried fruit. Fruit in cream or butter sauce. Vegetables Creamed or fried vegetables.  Vegetables in a cheese sauce. Regular canned vegetables that are not marked as low-sodium or reduced-sodium. Regular canned tomato sauce and paste that are not marked as low-sodium or reduced-sodium. Regular tomato and vegetable juices that are not marked as low-sodium or reduced-sodium. Dene. Olives. Grains Baked goods made with fat, such as croissants, muffins, or some breads. Dry pasta or rice meal packs. Meats and other proteins Fatty cuts of meat. Ribs. Fried meat. [place]. Bologna, salami, and other precooked or cured meats, such as sausages or meat loaves, that are not lean and low in sodium. Fat from the back of a pig (fatback). Bratwurst. Salted nuts and seeds. Canned beans with added salt. Canned or smoked fish. Whole eggs or egg yolks. Chicken or [place] with skin. Dairy Whole or 2% milk, cream, and half-and-half. Whole or full-fat cream cheese. Whole-fat or sweetened yogurt. Full-fat cheese. Nondairy creamers. Whipped toppings. Processed cheese and cheese spreads. Fats and oils Butter. Stick margarine. Lard. Shortening. Ghee. Bacon fat. Tropical oils, such as coconut, palm kernel, or palm oil. Seasonings and condiments Onion salt, garlic salt, seasoned salt, table salt, and sea salt. Worcestershire sauce. Tartar sauce. Barbecue sauce. Teriyaki sauce. Soy sauce, including reduced-sodium soy sauce. Steak sauce. Canned and packaged gravies. Fish sauce. Oyster sauce. Cocktail sauce. Store-bought  horseradish. Ketchup. Mustard. Meat flavorings and tenderizers. Bouillon cubes. Hot sauces. Pre-made or packaged marinades. Pre-made or packaged taco seasonings. Relishes. Regular salad dressings. Other foods Salted popcorn and pretzels. The items listed above may not be all the foods and drinks you should avoid. Talk to a dietitian to learn more. Where to find more information National Heart, Lung, and Blood Institute (NHLBI): BuffaloDryCleaner.gl American Heart Association (AHA): heart.org Academy of Nutrition and Dietetics: eatright.org National Kidney Foundation (NKF): kidney.org This information is not intended to replace advice given to you by your health care provider. Make sure you discuss any questions you have with your health care provider. Document Revised: 04/28/2022 Document Reviewed: 04/28/2022 Elsevier Patient Education  2024 [employer].

## 2024-02-06 NOTE — Progress Notes (Signed)
 IRISH BREISCH                                          MRN: [id number]   02/06/2024   The [facility] Quality Team Specialist reviewed this patient medical record for the purposes of chart review for care gap closure. The following were reviewed: chart review for care gap closure-kidney health evaluation for diabetes:eGFR  and uACR.    [facility]BCI Quality Team

## 2024-02-12 ENCOUNTER — Other Ambulatory Visit: Payer: Self-pay | Admitting: *Deleted

## 2024-02-12 ENCOUNTER — Telehealth: Payer: Self-pay | Admitting: *Deleted

## 2024-02-12 MED ORDER — BUDESONIDE-FORMOTEROL FUMARATE 160-4.5 MCG/ACT IN AERO: 2.0000 | INHALATION_SPRAY | Freq: Two times a day (BID) | RESPIRATORY_TRACT | 1 refills | Status: AC

## 2024-02-12 NOTE — Telephone Encounter (Signed)
 Lvm to schedule her

## 2024-02-12 NOTE — Telephone Encounter (Signed)
 Spoke with daughter Jon and she would like to stay here at our office.  She needs a TOC appointment.

## 2024-02-22 ENCOUNTER — Other Ambulatory Visit: Payer: Self-pay

## 2024-02-22 MED ORDER — ACCU-CHEK GUIDE TEST VI STRP: ORAL_STRIP | 0 refills | Status: AC

## 2024-04-23 ENCOUNTER — Other Ambulatory Visit: Payer: Self-pay | Admitting: Physician Assistant

## 2024-04-23 ENCOUNTER — Other Ambulatory Visit: Payer: Self-pay | Admitting: Family

## 2024-04-23 DIAGNOSIS — Z1231 Encounter for screening mammogram for malignant neoplasm of breast: Secondary | ICD-10-CM

## 2024-04-23 NOTE — Telephone Encounter (Unsigned)
 Copied from CRM [id number]. Topic: Clinical - Medication Refill >> Apr 23, 2024  2:46 PM Lauren C wrote: Medication: budesonide -formoterol  (SYMBICORT ) 160-4.5 MCG/ACT inhaler  Has the patient contacted their pharmacy? No Pt transferring care to [place]  This is the patient's preferred pharmacy:  [facility] [address] [place], [place] - [address] [place] [address] Phone: [phone] Fax: [phone]  Is this the correct pharmacy for this prescription? Yes If no, delete pharmacy and type the correct one.   Has the prescription been filled recently? Yes  Is the patient out of the medication? Yes  Has the patient been seen for an appointment in the last year OR does the patient have an upcoming appointment? Yes, apt for 1/23  Can we respond through MyChart? No, #[id number]  Agent: Please be advised that Rx refills may take up to 3 business days. We ask that you follow-up with your pharmacy.

## 2024-04-28 ENCOUNTER — Other Ambulatory Visit: Payer: Self-pay | Admitting: Internal Medicine

## 2024-05-17 ENCOUNTER — Ambulatory Visit

## 2024-05-17 ENCOUNTER — Encounter: Admitting: Physician Assistant

## 2024-05-31 ENCOUNTER — Telehealth: Payer: Self-pay | Admitting: *Deleted

## 2024-05-31 NOTE — Telephone Encounter (Signed)
 Pt has not established care with anyone since PCP left last  year and is past due. Notified daughter, Laray. She is aware and is pt's only transportation. States pt will not ride with anyone else. Daughter states she has to check her work schedule and will call back to arrange transfer of care appt as well as an AWV for the same day.

## 2024-06-14 ENCOUNTER — Ambulatory Visit
